# Patient Record
Sex: Female | Born: 1965
Health system: Southern US, Community
[De-identification: ages and names within clinical notes are randomized; demographics above are authoritative.]

## PROBLEM LIST (undated history)

## (undated) ENCOUNTER — Ambulatory Visit

## (undated) ENCOUNTER — Encounter: Attending: Gynecologic Oncology | Primary: Gynecologic Oncology

## (undated) ENCOUNTER — Encounter

## (undated) ENCOUNTER — Telehealth

## (undated) ENCOUNTER — Non-Acute Institutional Stay: Payer: PRIVATE HEALTH INSURANCE

## (undated) DIAGNOSIS — K219 Gastro-esophageal reflux disease without esophagitis: Secondary | ICD-10-CM

## (undated) DIAGNOSIS — I10 Essential (primary) hypertension: Secondary | ICD-10-CM

## (undated) DIAGNOSIS — N390 Urinary tract infection, site not specified: Secondary | ICD-10-CM

## (undated) DIAGNOSIS — N943 Premenstrual tension syndrome: Secondary | ICD-10-CM

## (undated) DIAGNOSIS — E039 Hypothyroidism, unspecified: Secondary | ICD-10-CM

## (undated) DIAGNOSIS — M5430 Sciatica, unspecified side: Secondary | ICD-10-CM

## (undated) DIAGNOSIS — E663 Overweight: Secondary | ICD-10-CM

## (undated) DIAGNOSIS — Z923 Personal history of irradiation: Secondary | ICD-10-CM

## (undated) DIAGNOSIS — D649 Anemia, unspecified: Secondary | ICD-10-CM

## (undated) DIAGNOSIS — R000 Tachycardia, unspecified: Secondary | ICD-10-CM

## (undated) DIAGNOSIS — G47419 Narcolepsy without cataplexy: Secondary | ICD-10-CM

## (undated) DIAGNOSIS — S39012A Strain of muscle, fascia and tendon of lower back, initial encounter: Secondary | ICD-10-CM

## (undated) DIAGNOSIS — L732 Hidradenitis suppurativa: Secondary | ICD-10-CM

## (undated) DIAGNOSIS — C569 Malignant neoplasm of unspecified ovary: Secondary | ICD-10-CM

## (undated) DIAGNOSIS — Z0000 Encounter for general adult medical examination without abnormal findings: Secondary | ICD-10-CM

## (undated) DIAGNOSIS — G4730 Sleep apnea, unspecified: Secondary | ICD-10-CM

## (undated) DIAGNOSIS — R5383 Other fatigue: Secondary | ICD-10-CM

## (undated) DIAGNOSIS — M62830 Muscle spasm of back: Secondary | ICD-10-CM

## (undated) HISTORY — DX: Strain of muscle, fascia and tendon of lower back, initial encounter: S39.012A

## (undated) HISTORY — DX: Urinary tract infection, site not specified: N39.0

## (undated) HISTORY — DX: Personal history of irradiation: Z92.3

## (undated) HISTORY — DX: Anemia, unspecified: D64.9

## (undated) HISTORY — DX: Sciatica, unspecified side: M54.30

## (undated) HISTORY — DX: Essential (primary) hypertension: I10

## (undated) HISTORY — DX: Hidradenitis suppurativa: L73.2

## (undated) HISTORY — DX: Tachycardia, unspecified: R00.0

## (undated) HISTORY — DX: Narcolepsy without cataplexy: G47.419

## (undated) HISTORY — DX: Premenstrual tension syndrome: N94.3

## (undated) HISTORY — DX: Encounter for general adult medical examination without abnormal findings: Z00.00

## (undated) HISTORY — DX: Overweight: E66.3

## (undated) HISTORY — DX: Other fatigue: R53.83

## (undated) HISTORY — DX: Sleep apnea, unspecified: G47.30

## (undated) HISTORY — DX: Muscle spasm of back: M62.830

## (undated) HISTORY — DX: Gastro-esophageal reflux disease without esophagitis: K21.9

## (undated) HISTORY — DX: Hypothyroidism, unspecified: E03.9

---
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
## 1998-10-11 ENCOUNTER — Emergency Department (HOSPITAL_COMMUNITY): Admission: EM | Admit: 1998-10-11 | Discharge: 1998-10-11 | Payer: Self-pay | Admitting: *Deleted

## 1998-12-19 ENCOUNTER — Other Ambulatory Visit: Admission: RE | Admit: 1998-12-19 | Discharge: 1998-12-19 | Payer: Self-pay | Admitting: Obstetrics and Gynecology

## 2005-10-09 ENCOUNTER — Ambulatory Visit: Payer: Self-pay | Admitting: Family Medicine

## 2005-10-16 ENCOUNTER — Ambulatory Visit: Payer: Self-pay | Admitting: Family Medicine

## 2005-10-23 ENCOUNTER — Ambulatory Visit: Payer: Self-pay | Admitting: Family Medicine

## 2006-06-11 ENCOUNTER — Ambulatory Visit: Payer: Self-pay | Admitting: Family Medicine

## 2006-07-22 ENCOUNTER — Ambulatory Visit: Payer: Self-pay | Admitting: Family Medicine

## 2006-10-08 ENCOUNTER — Ambulatory Visit: Payer: Self-pay | Admitting: Family Medicine

## 2006-10-08 DIAGNOSIS — G4730 Sleep apnea, unspecified: Secondary | ICD-10-CM | POA: Insufficient documentation

## 2006-10-08 DIAGNOSIS — R5381 Other malaise: Secondary | ICD-10-CM

## 2006-10-08 DIAGNOSIS — N943 Premenstrual tension syndrome: Secondary | ICD-10-CM | POA: Insufficient documentation

## 2006-10-08 DIAGNOSIS — R5383 Other fatigue: Secondary | ICD-10-CM

## 2006-10-08 DIAGNOSIS — E039 Hypothyroidism, unspecified: Secondary | ICD-10-CM

## 2006-10-08 DIAGNOSIS — E663 Overweight: Secondary | ICD-10-CM | POA: Insufficient documentation

## 2006-11-05 ENCOUNTER — Encounter: Payer: Self-pay | Admitting: Family Medicine

## 2006-11-05 ENCOUNTER — Ambulatory Visit (HOSPITAL_BASED_OUTPATIENT_CLINIC_OR_DEPARTMENT_OTHER): Admission: RE | Admit: 2006-11-05 | Discharge: 2006-11-05 | Payer: Self-pay | Admitting: Family Medicine

## 2006-11-18 ENCOUNTER — Ambulatory Visit: Payer: Self-pay | Admitting: Pulmonary Disease

## 2006-11-27 ENCOUNTER — Telehealth: Payer: Self-pay

## 2006-12-01 ENCOUNTER — Ambulatory Visit: Payer: Self-pay | Admitting: Family Medicine

## 2006-12-09 ENCOUNTER — Ambulatory Visit: Payer: Self-pay | Admitting: Family Medicine

## 2007-04-07 ENCOUNTER — Ambulatory Visit: Payer: Self-pay | Admitting: Family Medicine

## 2007-04-07 DIAGNOSIS — K219 Gastro-esophageal reflux disease without esophagitis: Secondary | ICD-10-CM | POA: Insufficient documentation

## 2007-04-07 DIAGNOSIS — N390 Urinary tract infection, site not specified: Secondary | ICD-10-CM | POA: Insufficient documentation

## 2007-04-07 LAB — CONVERTED CEMR LAB
Bilirubin Urine: NEGATIVE
Blood in Urine, dipstick: NEGATIVE
Glucose, Urine, Semiquant: NEGATIVE
Ketones, urine, test strip: NEGATIVE
Nitrite: NEGATIVE
Specific Gravity, Urine: 1.015
WBC Urine, dipstick: NEGATIVE

## 2007-04-28 ENCOUNTER — Telehealth: Payer: Self-pay | Admitting: Family Medicine

## 2007-06-17 ENCOUNTER — Telehealth (INDEPENDENT_AMBULATORY_CARE_PROVIDER_SITE_OTHER): Payer: Self-pay | Admitting: *Deleted

## 2007-06-24 ENCOUNTER — Telehealth: Payer: Self-pay | Admitting: Family Medicine

## 2007-10-21 ENCOUNTER — Ambulatory Visit: Payer: Self-pay | Admitting: Family Medicine

## 2007-10-21 DIAGNOSIS — S335XXA Sprain of ligaments of lumbar spine, initial encounter: Secondary | ICD-10-CM

## 2007-10-21 DIAGNOSIS — M5380 Other specified dorsopathies, site unspecified: Secondary | ICD-10-CM

## 2007-10-21 DIAGNOSIS — M5430 Sciatica, unspecified side: Secondary | ICD-10-CM

## 2007-10-21 DIAGNOSIS — S339XXA Sprain of unspecified parts of lumbar spine and pelvis, initial encounter: Secondary | ICD-10-CM | POA: Insufficient documentation

## 2008-03-31 ENCOUNTER — Emergency Department (HOSPITAL_COMMUNITY): Admission: EM | Admit: 2008-03-31 | Discharge: 2008-03-31 | Payer: Self-pay | Admitting: Emergency Medicine

## 2008-10-23 ENCOUNTER — Ambulatory Visit: Payer: Self-pay | Admitting: Family Medicine

## 2008-10-31 ENCOUNTER — Ambulatory Visit: Payer: Self-pay | Admitting: Family Medicine

## 2008-10-31 LAB — CONVERTED CEMR LAB
ALT: 18 units/L (ref 0–35)
AST: 16 units/L (ref 0–37)
BUN: 9 mg/dL (ref 6–23)
Bilirubin Urine: NEGATIVE
Bilirubin, Direct: 0 mg/dL (ref 0.0–0.3)
Calcium: 9.2 mg/dL (ref 8.4–10.5)
Cholesterol: 141 mg/dL (ref 0–200)
Creatinine, Ser: 0.8 mg/dL (ref 0.4–1.2)
Eosinophils Relative: 4.4 % (ref 0.0–5.0)
GFR calc non Af Amer: 83.3 mL/min (ref >60)
LDL Cholesterol: 79 mg/dL (ref 0–99)
Lymphocytes Relative: 28.7 % (ref 12.0–46.0)
Monocytes Relative: 6 % (ref 3.0–12.0)
Neutrophils Relative %: 59.4 % (ref 43.0–77.0)
Platelets: 312 10*3/uL (ref 150.0–400.0)
Potassium: 3.9 meq/L (ref 3.5–5.1)
Protein, U semiquant: NEGATIVE
Total Bilirubin: 0.7 mg/dL (ref 0.3–1.2)
Triglycerides: 71 mg/dL (ref 0.0–149.0)
Urobilinogen, UA: 0.2
VLDL: 14.2 mg/dL (ref 0.0–40.0)
WBC: 11.3 10*3/uL — ABNORMAL HIGH (ref 4.5–10.5)

## 2009-08-07 ENCOUNTER — Ambulatory Visit: Payer: Self-pay | Admitting: Family Medicine

## 2009-08-07 DIAGNOSIS — G47419 Narcolepsy without cataplexy: Secondary | ICD-10-CM | POA: Insufficient documentation

## 2009-08-07 DIAGNOSIS — D649 Anemia, unspecified: Secondary | ICD-10-CM | POA: Insufficient documentation

## 2009-10-11 ENCOUNTER — Ambulatory Visit: Payer: Self-pay | Admitting: Family Medicine

## 2009-10-11 DIAGNOSIS — L732 Hidradenitis suppurativa: Secondary | ICD-10-CM

## 2009-10-11 DIAGNOSIS — R000 Tachycardia, unspecified: Secondary | ICD-10-CM

## 2009-10-12 ENCOUNTER — Encounter: Payer: Self-pay | Admitting: Family Medicine

## 2009-11-08 ENCOUNTER — Ambulatory Visit: Payer: Self-pay | Admitting: Family Medicine

## 2009-11-08 DIAGNOSIS — I10 Essential (primary) hypertension: Secondary | ICD-10-CM | POA: Insufficient documentation

## 2010-02-13 ENCOUNTER — Ambulatory Visit (HOSPITAL_COMMUNITY)
Admission: RE | Admit: 2010-02-13 | Discharge: 2010-02-13 | Payer: Self-pay | Source: Home / Self Care | Attending: Cardiovascular Disease | Admitting: Cardiovascular Disease

## 2010-02-13 ENCOUNTER — Encounter: Payer: Self-pay | Admitting: Cardiovascular Disease

## 2010-02-13 ENCOUNTER — Ambulatory Visit: Admission: RE | Admit: 2010-02-13 | Discharge: 2010-02-13 | Payer: Self-pay | Source: Home / Self Care

## 2010-02-13 ENCOUNTER — Ambulatory Visit
Admission: RE | Admit: 2010-02-13 | Discharge: 2010-02-13 | Payer: Self-pay | Source: Home / Self Care | Attending: Cardiovascular Disease | Admitting: Cardiovascular Disease

## 2010-03-07 NOTE — Assessment & Plan Note (Signed)
Summary: rash underneath bottom lip/med check/refills/cjr   Vital Signs:  Patient profile:   45 year old female Height:      64 inches (162.56 cm) Weight:      197 pounds (89.55 kg) O2 Sat:      99 % on Room air Temp:     98.3 degrees F (36.83 degrees C) oral Pulse rate:   107 / minute BP sitting:   120 / 94  (left arm) Cuff size:   large  Vitals Entered By: Josph Macho RMA (October 11, 2009 10:52 AM)  O2 Flow:  Room air CC: Rash underneath bottom lip/ med check/ refill/ CF Is Patient Diabetic? No   History of Present Illness: Patient is a 45 year old female in today with multiple concerns. Her first complaint is a rash under her lower lip which has been present for roughly 3 months. It started as a postural or acne type lesion. The lesion has become itchy hard and red since that time. And more recently has developed a new pustular head. Not painful and no other similar lesions are noted. 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 Her back has now completely recovered and she would like a note today to release her back to full duty at work. She denies low back pain radicular symptoms bowel or bladder incontinence or difficulty. She has a long history of intermittent palpitations. Generally they don't worry although at times they are more severe. They are not accompanied by chest pain shortness of breath nausea or other physical complaints. She does acknowledge significant caffeine use and working at night especially she also uses phentermine and new vigil to help with her shift work. Her father died in 07-18-22 prior to that time she was 6 times a week now she is working 5 times a week and often has trouble sleeping. She finds the new vigil helps her energy during the day and would like to continue it. She would like a refill on phentermine as well to help with her obesity but has not been taking it recently.  No recent illness/f/c/malasie. She does acknowledge she did not sleep well last night  Current Medications (verified): 1)  Xyzal 5 Mg Tabs (Levocetirizine Dihydrochloride) .... Take 1 Tablet By Mouth Once A Day 2)  Maxzide-25 37.5-25 Mg  Tabs (Triamterene-Hctz) .... Two Times A Day X 2 Weeks Before Menses Then 1 Eacjh Day 3)  Prilosec 40 Mg  Cpdr (Omeprazole) .Marland Kitchen.. 1 By Mouth Once Daily 4)  Flexeril 10 Mg Tabs (Cyclobenzaprine Hcl) .Marland Kitchen.. 1 Morning ,midafternoon and Bedtime For Muscle Spasm 5)  Levothyroxine Sodium 75 Mcg Tabs (Levothyroxine Sodium) .Marland Kitchen.. 1 Qd 6)  Nuvigil 150 Mg Tabs (Armodafinil) .Marland Kitchen.. 1 Qd 7)  Phenteramine 37.5 .Marland Kitchen.. 1 Each  Day To Decrease Appetite 8)  Ibuprofen 800 Mg Tabs (Ibuprofen) .Marland Kitchen.. 1 By Mouth Three Times A Day For Inflammation and Pain.  Allergies (verified): 1)  Amoxicillin (Amoxicillin)  Past History:  Past medical history reviewed for relevance to current acute and chronic problems. Social history (including risk factors) reviewed for relevance to current acute and chronic problems.  Social History: Reviewed history from 04/07/2007 and no changes required. Married 2 children son 69 and daughter 27 Occupation:sleep study tech  Never Smoked Regular exercise-yes  Review of Systems      See HPI  Physical Exam  General:  Well-developed,well-nourished,in no acute distress; alert,appropriate  and cooperative throughout examination Head:  Normocephalic and atraumatic without obvious abnormalities. No apparent alopecia or balding. Nose:  External nasal examination shows no deformity or inflammation. Nasal mucosa are pink and moist without lesions or exudates. Mouth:  dry mucus membranes Neck:  No deformities, masses, or tenderness noted. Lungs:  Normal respiratory effort, chest expands symmetrically. Lungs are clear to auscultation, no crackles or wheezes. Heart:  Normal rate and regular rhythm. S1 and S2 normal without gallop, murmur, click, rub or other extra  sounds. Abdomen:  Bowel sounds positive,abdomen soft and non-tender without masses, organomegaly or hernias noted. Msk:  No deformity or scoliosis noted of thoracic or lumbar spine.   Extremities:  No clubbing, cyanosis, edema, or deformity noted with normal full range of motion of all joints.   Neurologic:  No cranial nerve deficits noted. Station and gait are normal. Plantar reflexes are down-going bilaterally. DTRs are symmetrical throughout. Sensory, motor and coordinative functions appear intact. Skin:  2-85mm erythematous hard, raided lesion on chin with pustular head Cervical Nodes:  No lymphadenopathy noted Psych:  Cognition and judgment appear intact. Alert and cooperative with normal attention span and concentration. No apparent delusions, illusions, hallucinations   Impression & Recommendations:  Problem # 1:  HIDRADENITIS SUPPURATIVA (ICD-705.83)  Orders: T-Culture, Wound ([id number]) with underlying scar tissue noted if a course of antibiotics and topical treatment does not resolve this may consider surgical removal.  Problem # 2:  NARCOLEPSY WITHOUT CATAPLEXY (ICD-347.00) Given refill on Nuvigil  Problem # 3:  TACHYCARDIA (ICD-785.0) Multifactorial, will not restart Phentermine, will start some Toprol and she is asked to avoid or at least minimize caffeine, reeval in one month or sooner if patient develops any concerning symptoms  Problem # 4:  LUMBOSACRAL STRAIN, ACUTE (ICD-846.0) Improved, released back to full duty work today. Report if symptoms worsen  Complete Medication List: 1)  Xyzal 5 Mg Tabs (Levocetirizine dihydrochloride) .... Take 1 tablet by mouth once a day 2)  Maxzide-25 37.5-25 Mg Tabs (Triamterene-hctz) .... Two times a day x 2 weeks before menses then 1 eacjh day 3)  Prilosec 40 Mg Cpdr (Omeprazole) .Marland Kitchen.. 1 by mouth once daily 4)  Flexeril 10 Mg Tabs (Cyclobenzaprine hcl) .Marland Kitchen.. 1 morning ,midafternoon and bedtime for muscle spasm 5)  Levothyroxine  Sodium 75 Mcg Tabs (Levothyroxine sodium) .Marland Kitchen.. 1 qd 6)  Nuvigil 150 Mg Tabs (Armodafinil) .Marland Kitchen.. 1 tab po daily 7)  Phenteramine 37.5  .Marland Kitchen.. 1 each  day to decrease appetite 8)  Ibuprofen 800 Mg Tabs (Ibuprofen) .Marland Kitchen.. 1 by mouth three times a day for inflammation and pain. 9)  Doxycycline Hyclate 100 Mg Caps (Doxycycline hyclate) .Marland Kitchen.. 1 cap by mouth two times a day x 10 day 10)  Toprol Xl 25 Mg Xr24h-tab (Metoprolol succinate) .Marland Kitchen.. 1 tab by mouth daily  Patient Instructions: 1)  Please schedule a follow-up appointment in 1 month.  2)  Limit your Sodium(salt) .  3)  It is important that you exercise reguarly at least 20 minutes 5 times a week. If you develop chest pain, have severe difficulty breathing, or feel very tired, stop exercising immediately and seek medical attention.  4)  You need to lose weight. Consider a lower calorie diet and regular exercise.  5)  Take your antibiotic as prescribed until ALL of it is gone, but stop if you develop a rash or swelling and contact our office as soon as possible.  6)  For skin Use Cetaphil soap, Witch Hazel Astringent, and a Benzoyl Peroxide  product, such as Oxy 10 7)  While taking a antibiotic take a probiotic such as Align or Activia Prescriptions: TOPROL XL 25 MG XR24H-TAB (METOPROLOL SUCCINATE) 1 tab by mouth daily  #30 x 2   Entered and Authorized by:   Danise Edge MD   Signed by:   Danise Edge MD on 10/11/2009   Method used:   Electronically to        [facility]  [phone]* (retail)       [address]       [place], [place]  [address]       Ph: [phone] or [phone]       Fax: [phone]   RxID:   [id number] DOXYCYCLINE HYCLATE 100 MG CAPS (DOXYCYCLINE HYCLATE) 1 cap by mouth two times a day x 10 day  #20 x 1   Entered and Authorized by:   Danise Edge MD   Signed by:   Danise Edge MD on 10/11/2009   Method used:   Electronically to        [facility]  [phone]* (retail)        [address]       [place], [place]  [address]       Ph: [phone] or [phone]       Fax: [phone]   RxID:   [id number] NUVIGIL 150 MG TABS (ARMODAFINIL) 1 tab po daily  #30 x 1   Entered and Authorized by:   Danise Edge MD   Signed by:   Danise Edge MD on 10/11/2009   Method used:   Print then Give to Patient   RxID:   [id number]

## 2010-03-07 NOTE — Assessment & Plan Note (Signed)
Summary: 1 month rov/njr   Vital Signs:  Patient profile:   45 year old female Height:      64 inches (162.56 cm) Weight:      201.31 pounds (91.50 kg) O2 Sat:      99 % on Room air Temp:     98.3 degrees F (36.83 degrees C) oral Pulse rate:   106 / minute BP sitting:   142 / 100  (left arm) Cuff size:   large  Vitals Entered By: Josph Macho RMA (November 08, 2009 8:47 AM)  O2 Flow:  Room air CC: 1 month office visit/ CF Is Patient Diabetic? No   History of Present Illness: patient is in today for followup on her blood pressure and recent illness. She is feeling well no fevers, no chills, no congestion no, and chest pain. She denies palpitations, shortness of breath, GI or GU complaints at this time. She noticed that she has not started the Toprol yet she was trying to use at the bedside. She did stop the phentermine I suggested at the last visit and has had no more palpitations since doing this  Current Medications (verified): 1)  Xyzal 5 Mg Tabs (Levocetirizine Dihydrochloride) .... Take 1 Tablet By Mouth Once A Day 2)  Maxzide-25 37.5-25 Mg  Tabs (Triamterene-Hctz) .... Two Times A Day X 2 Weeks Before Menses Then 1 Eacjh Day 3)  Prilosec 40 Mg  Cpdr (Omeprazole) .Marland Kitchen.. 1 By Mouth Once Daily 4)  Flexeril 10 Mg Tabs (Cyclobenzaprine Hcl) .Marland Kitchen.. 1 Morning ,midafternoon and Bedtime For Muscle Spasm 5)  Levothyroxine Sodium 75 Mcg Tabs (Levothyroxine Sodium) .Marland Kitchen.. 1 Qd 6)  Nuvigil 150 Mg Tabs (Armodafinil) .Marland Kitchen.. 1 Tab Po Daily 7)  Ibuprofen 800 Mg Tabs (Ibuprofen) .Marland Kitchen.. 1 By Mouth Three Times A Day For Inflammation and Pain. 8)  Toprol Xl 25 Mg Xr24h-Tab (Metoprolol Succinate) .Marland Kitchen.. 1 Tab By Mouth Daily  Allergies (verified): 1)  Amoxicillin (Amoxicillin)  Past History:  Past medical history reviewed for relevance to current acute and chronic problems. Social history (including risk factors) reviewed for relevance to current acute and chronic problems.  Social History: Reviewed  history from 04/07/2007 and no changes required. Married 2 children son 29 and daughter 23 Occupation:sleep study tech  Never Smoked Regular exercise-yes  Review of Systems      See HPI  Physical Exam  General:  Well-developed,well-nourished,in no acute distress; alert,appropriate and cooperative throughout examination Head:  Normocephalic and atraumatic without obvious abnormalities. No apparent alopecia or balding. Eyes:  No corneal or conjunctival inflammation noted. EOMI. Perrla. Funduscopic exam benign, without hemorrhages, exudates or papilledema. Vision grossly normal. Nose:  External nasal examination shows no deformity or inflammation. Nasal mucosa are pink and moist without lesions or exudates. Mouth:  Oral mucosa and oropharynx without lesions or exudates.  Teeth in good repair. Neck:  No deformities, masses, or tenderness noted. Lungs:  Normal respiratory effort, chest expands symmetrically. Lungs are clear to auscultation, no crackles or wheezes. Heart:  Normal rate and regular rhythm. S1 and S2 normal without gallop, murmur, click, rub or other extra sounds. Abdomen:  Bowel sounds positive,abdomen soft and non-tender without masses, organomegaly or hernias noted. Extremities:  No clubbing, cyanosis, edema, or deformity noted with normal full range of motion of all joints.   Psych:  Cognition and judgment appear intact. Alert and cooperative with normal attention span and concentration. No apparent delusions, illusions, hallucinations   Impression & Recommendations:  Problem # 1:  TACHYCARDIA (ICD-785.0)  start Toprol  Problem # 2:  GERD (ICD-530.81)  Her updated medication list for this problem includes:    Prilosec 40 Mg Cpdr (Omeprazole) .Marland Kitchen... 1 by mouth once daily No c/o  Problem # 3:  ESSENTIAL HYPERTENSION, BENIGN (ICD-401.1)  Her updated medication list for this problem includes:    Maxzide-25 37.5-25 Mg Tabs (Triamterene-hctz) .Marland Kitchen..Marland Kitchen Two times a day x 2 weeks  before menses then 1 eacjh day    Toprol Xl 25 Mg Xr24h-tab (Metoprolol succinate) .Marland Kitchen... 1 tab by mouth daily start Toprol  Complete Medication List: 1)  Xyzal 5 Mg Tabs (Levocetirizine dihydrochloride) .... Take 1 tablet by mouth once a day 2)  Maxzide-25 37.5-25 Mg Tabs (Triamterene-hctz) .... Two times a day x 2 weeks before menses then 1 eacjh day 3)  Prilosec 40 Mg Cpdr (Omeprazole) .Marland Kitchen.. 1 by mouth once daily 4)  Flexeril 10 Mg Tabs (Cyclobenzaprine hcl) .Marland Kitchen.. 1 morning ,midafternoon and bedtime for muscle spasm 5)  Levothyroxine Sodium 75 Mcg Tabs (Levothyroxine sodium) .Marland Kitchen.. 1 qd 6)  Nuvigil 150 Mg Tabs (Armodafinil) .Marland Kitchen.. 1 tab po daily 7)  Ibuprofen 800 Mg Tabs (Ibuprofen) .Marland Kitchen.. 1 by mouth three times a day for inflammation and pain. 8)  Toprol Xl 25 Mg Xr24h-tab (Metoprolol succinate) .Marland Kitchen.. 1 tab by mouth daily 9)  Azithromycin 250 Mg Tabs (Azithromycin) .... 2 tabs by mouth once and then 1 tab by mouth once daily x 4d 10)  Mucinex 600 Mg Xr12h-tab (Guaifenesin) .Marland Kitchen.. 1 tab by mouth by mouth two times a day x 10 day  Patient Instructions: 1)  Please schedule a follow-up appointment in 1 month.  2)  If you start the antibiotic thenTake your antibiotic as prescribed until ALL of it is gone, but stop if you develop a rash or swelling and contact our office as soon as possible.  3)  Acute sinusitis symptoms for less than 10 days are not helped by antibiotics. Use warm moist compresses, and over the counter decongestants( only as directed). Call if no improvement in 5-7 days, sooner if increasing pain, fever, or new symptoms.  Prescriptions: AZITHROMYCIN 250 MG TABS (AZITHROMYCIN) 2 tabs by mouth once and then 1 tab by mouth once daily x 4d  #6 x 0   Entered and Authorized by:   Danise Edge MD   Signed by:   Danise Edge MD on 11/08/2009   Method used:   Electronically to        [facility]  [phone]* (retail)       [address]       [place], [place]  [address]       Ph: [phone] or [phone]       Fax: [phone]   RxID:   [id number]

## 2010-03-07 NOTE — Assessment & Plan Note (Signed)
Summary: MED CHECK/REFILLES/CONSULT THYROID/CJR   Vital Signs:  Patient profile:   45 year old female Height:      64 inches Weight:      190 pounds BMI:     32.73 O2 Sat:      95 % Temp:     98.3 degrees F Pulse rate:   99 / minute Pulse rhythm:   regular BP sitting:   124 / 82  (left arm) Cuff size:   large  Vitals Entered By: Pura Spice, RN (August 07, 2009 1:28 PM) CC: rov reck tsh  ? pherteramine    History of Present Illness: This 45 year old white married female is in concerned about her thyroid status since she is having considerable problem weight gain she has lost down his gaining easily and would like to start on the weight reduction diet as well as requests and phentermine to help decrease her appetite She consented to have a problem with narcolepsy do not a and she is responded to do visual in the past and would like a new prescription GERD has been controlled with Prilosec  Allergies: 1)  Amoxicillin (Amoxicillin)  Past History:  Social History: Last updated: 04/07/2007 Married 2 children son 51 and daughter 27 Occupation:sleep study tech  Never Smoked Regular exercise-yes  Risk Factors: Smoking Status: never (04/07/2007)  Review of Systems      See HPI  The patient denies anorexia, fever, weight loss, weight gain, vision loss, decreased hearing, hoarseness, chest pain, syncope, dyspnea on exertion, peripheral edema, prolonged cough, headaches, hemoptysis, abdominal pain, melena, hematochezia, severe indigestion/heartburn, hematuria, incontinence, genital sores, muscle weakness, suspicious skin lesions, transient blindness, difficulty walking, depression, unusual weight change, abnormal bleeding, enlarged lymph nodes, angioedema, breast masses, and testicular masses.    Physical Exam  General:  Well-developed,well-nourished,in no acute distress; alert,appropriate and cooperative throughout examinationoverweight-appearing.   Lungs:  Normal respiratory  effort, chest expands symmetrically. Lungs are clear to auscultation, no crackles or wheezes. Heart:  Normal rate and regular rhythm. S1 and S2 normal without gallop, murmur, click, rub or other extra sounds. Abdomen:  Bowel sounds positive,abdomen soft and non-tender without masses, organomegaly or hernias noted., obese   Impression & Recommendations:  Problem # 1:  ANEMIA (ICD-285.9) Assessment Improved  Orders: Hgb ([address]) Fingerstick ([address])  Problem # 2:  SLEEP APNEA (ICD-780.57) Assessment: Unchanged  Problem # 3:  GERD (ICD-530.81) Assessment: Improved  Her updated medication list for this problem includes:    Prilosec 40 Mg Cpdr (Omeprazole) .Marland Kitchen... 1 by mouth once daily  Problem # 4:  OVERWEIGHT (ICD-278.02) Assessment: Unchanged weight reduction diet phernteramine 37.5 mg each a.m. to decrease appetite for 3 months  Problem # 5:  HYPOTHYROIDISM NOS (ICD-244.9) Assessment: Unchanged  Her updated medication list for this problem includes:    Levothyroxine Sodium 75 Mcg Tabs (Levothyroxine sodium) .Marland Kitchen... 1 qd  Orders: Venipuncture ([address]) TLB-TSH (Thyroid Stimulating Hormone) (84443-TSH)  Complete Medication List: 1)  Xyzal 5 Mg Tabs (Levocetirizine dihydrochloride) .... Take 1 tablet by mouth once a day 2)  Maxzide-25 37.5-25 Mg Tabs (Triamterene-hctz) .... Two times a day x 2 weeks before menses then 1 eacjh day 3)  Prilosec 40 Mg Cpdr (Omeprazole) .Marland Kitchen.. 1 by mouth once daily 4)  Flexeril 10 Mg Tabs (Cyclobenzaprine hcl) .Marland Kitchen.. 1 morning ,midafternoon and bedtime for muscle spasm 5)  Levothyroxine Sodium 75 Mcg Tabs (Levothyroxine sodium) .Marland Kitchen.. 1 qd 6)  Nuvigil 150 Mg Tabs (Armodafinil) .Marland Kitchen.. 1 qd 7)  Phenteramine 37.5  .Marland KitchenMarland KitchenMarland Kitchen  1 each  day to decrease appetite 8)  Ibuprofen 800 Mg Tabs (Ibuprofen) .Marland Kitchen.. 1 by mouth three times a day for inflammation and pain.  Patient Instructions: 1)  head lab for TSH and hemoglobin, hemoglobin was normal 2)  You need to lose weight.  Consider a lower calorie diet and regular exercise.  3)  We'll call lab results Prescriptions: PHENTERAMINE 37.5 1 each  day to decrease appetite  #30 x 3   Entered and Authorized by:   Judithann Sheen MD   Signed by:   Judithann Sheen MD on 08/07/2009   Method used:   Print then Give to Patient   RxID:   [id number] NUVIGIL 150 MG TABS (ARMODAFINIL) 1 qd  #30 x 11   Entered and Authorized by:   Judithann Sheen MD   Signed by:   Judithann Sheen MD on 08/07/2009   Method used:   Print then Give to Patient   RxID:   [id number]   Laboratory Results   Blood Tests   Date/Time Recieved: August 07, 2009 3:38 PM  Date/Time Reported: August 07, 2009 3:38 PM    CBC HGB:  11.6 g/dL   (Normal Range: 29.5-28.4 in Males, 12.0-15.0 in Females) Comments: Wynona Canes, CMA  August 07, 2009 3:38 PM

## 2010-03-07 NOTE — Letter (Signed)
Summary: Generic Letter  [facility]  [address] [place], [place] [address]   Phone: [phone]  Fax: [phone]    10/11/2009  [name] [address] [place], [place]  [address]  Dear Ms. Yehuda Mao,    Patient is released to return to work unrestricted duty as of today 10/11/2009     Sincerely,   Danise Edge MD

## 2010-03-07 NOTE — Assessment & Plan Note (Signed)
Summary: F/U ECHO DONE TODAY AT 8:30   History of Present Illness: Jamesetta is seen today at the request of Dr Rogelia Rohrer for tachycardia and palpiations. She has had them for months.  Primary wanted to start BB.  Thyroid normal.  Occasional palpitations with exertion but no syncpope SSCP, and dyspnea consistant with deconditioning.  She works third shift at times, is sedentary and has been on both phenteramine and provigil in the past.  Long discussion with her regarding lifestyle, weight, exercise and stimulants.  Told her to avoid provigil and phenteramine.    ECG today Normal HR 89 Echo:  Reviewed normal EF 55-60k% and no evidence of pulmonary hypertension.  Current Problems (verified): 1)  Tachycardia  (ICD-785.0) 2)  Essential Hypertension, Benign  (ICD-401.1) 3)  Hidradenitis Suppurativa  (ICD-705.83) 4)  Narcolepsy Without Cataplexy  (ICD-347.00) 5)  Anemia  (ICD-285.9) 6)  Physical Examination  (ICD-V70.0) 7)  Muscle Spasm, Back  (ICD-724.8) 8)  Sciatica, Acute  (ICD-724.3) 9)  Lumbosacral Strain, Acute  (ICD-846.0) 10)  Gerd  (ICD-530.81) 11)  Uti  (ICD-599.0) 12)  Pms  (ICD-625.4) 13)  Sleep Apnea  (ICD-780.57) 14)  Fatigue  (ICD-780.79) 15)  Overweight  (ICD-278.02) 16)  Hypothyroidism Nos  (ICD-244.9)  Current Medications (verified): 1)  Prilosec 40 Mg  Cpdr (Omeprazole) .... As Needed 2)  Flexeril 10 Mg Tabs (Cyclobenzaprine Hcl) .... As Needed 3)  Levothyroxine Sodium 75 Mcg Tabs (Levothyroxine Sodium) .Marland Kitchen.. 1 Qd 4)  Nuvigil 150 Mg Tabs (Armodafinil) .... As Needed 5)  Ibuprofen 800 Mg Tabs (Ibuprofen) .Marland Kitchen.. 1 By Mouth Three Times A Day For Inflammation and Pain.  Allergies (verified): 1)  Amoxicillin (Amoxicillin)  Past History:  Past Medical History: Last updated: 02/11/2010 TACHYCARDIA  ESSENTIAL HYPERTENSION, BENIGN HIDRADENITIS SUPPURATIVA NARCOLEPSY WITHOUT CATAPLEXY ANEMIA  PHYSICAL EXAMINATION  MUSCLE SPASM, BACK SCIATICA, ACUTE  LUMBOSACRAL STRAIN,  ACUTE  GERD  UTI  PMS  SLEEP APNEA  FATIGUE  OVERWEIGHT  HYPOTHYROIDISM NOS   Family History: Last updated: 02/11/2010 noncontributory  Social History: Last updated: 04/07/2007 Married 2 children son 74 and daughter 51 Occupation:sleep study tech  Never Smoked Regular exercise-yes  Review of Systems       Denies fever, malais, weight loss, blurry vision, decreased visual acuity, cough, sputum, SOB, hemoptysis, pleuritic pain,heartburn, abdominal pain, melena, lower extremity edema, claudication, or rash.   Vital Signs:  Patient profile:   45 year old female Height:      64 inches Weight:      201 pounds BMI:     34.63 Pulse rate:   89 / minute Resp:     14 per minute BP sitting:   122 / 80  (left arm)  Vitals Entered By: Kem Parkinson (February 13, 2010 9:48 AM)  Physical Exam  General:  Affect appropriate Healthy:  appears stated age HEENT: normal Neck supple with no adenopathy JVP normal no bruits no thyromegaly Lungs clear with no wheezing and good diaphragmatic motion Heart:  S1/S2 no murmur,rub, gallop or click PMI normal Abdomen: benighn, BS positve, no tenderness, no AAA no bruit.  No HSM or HJR Distal pulses intact with no bruits No edema Neuro non-focal Skin warm and dry    Impression & Recommendations:  Problem # 1:  TACHYCARDIA (ICD-785.0) No abnormal heart rate response.  Normal echo ECG and TSH.  Avoid stimulants phenteramne and provigil.  Weight loss and exercise encouraged  Problem # 2:  ESSENTIAL HYPERTENSION, BENIGN (ICD-401.1) Continue diuretic as needed.  I dont think  she is taking Toprol.  Will not need meds if she makes lifestyle changes The following medications were removed from the medication list:    Maxzide-25 37.5-25 Mg Tabs (Triamterene-hctz) .Marland Kitchen..Marland Kitchen Two times a day x 2 weeks before menses then 1 eacjh day    Toprol Xl 25 Mg Xr24h-tab (Metoprolol succinate) .Marland Kitchen... 1 tab by mouth daily  Other Orders: EKG w/ Interpretation  (93[address])  Patient Instructions: 1)  Your physician recommends that you schedule a follow-up appointment in: AS NEEDED 2)  Your physician recommends that you continue on your current medications as directed. Please refer to the Current Medication list given to you today.

## 2010-04-15 ENCOUNTER — Encounter: Payer: Self-pay | Admitting: Family Medicine

## 2010-04-15 ENCOUNTER — Other Ambulatory Visit (INDEPENDENT_AMBULATORY_CARE_PROVIDER_SITE_OTHER): Payer: BC Managed Care – PPO | Admitting: Family Medicine

## 2010-04-15 DIAGNOSIS — Z0000 Encounter for general adult medical examination without abnormal findings: Secondary | ICD-10-CM

## 2010-04-15 LAB — CBC WITH DIFFERENTIAL/PLATELET
Eosinophils Relative: 2.4 % (ref 0.0–5.0)
HCT: 34.8 % — ABNORMAL LOW (ref 36.0–46.0)
Hemoglobin: 11.9 g/dL — ABNORMAL LOW (ref 12.0–15.0)
Lymphs Abs: 2.5 10*3/uL (ref 0.7–4.0)
MCV: 88.9 fl (ref 78.0–100.0)
Monocytes Absolute: 0.6 10*3/uL (ref 0.1–1.0)
Neutro Abs: 5.3 10*3/uL (ref 1.4–7.7)
Platelets: 290 10*3/uL (ref 150.0–400.0)
RDW: 14.2 % (ref 11.5–14.6)
WBC: 8.6 10*3/uL (ref 4.5–10.5)

## 2010-04-15 LAB — HEPATIC FUNCTION PANEL
ALT: 26 U/L (ref 0–35)
AST: 22 U/L (ref 0–37)
Albumin: 4.1 g/dL (ref 3.5–5.2)
Total Bilirubin: 0.5 mg/dL (ref 0.3–1.2)

## 2010-04-15 LAB — POCT URINALYSIS DIPSTICK
Bilirubin, UA: NEGATIVE
Blood, UA: NEGATIVE
Glucose, UA: NEGATIVE
Spec Grav, UA: 1.02

## 2010-04-15 LAB — BASIC METABOLIC PANEL
BUN: 9 mg/dL (ref 6–23)
Chloride: 103 mEq/L (ref 96–112)
Glucose, Bld: 88 mg/dL (ref 70–99)
Potassium: 4.1 mEq/L (ref 3.5–5.1)
Sodium: 136 mEq/L (ref 135–145)

## 2010-04-15 LAB — TSH: TSH: 1.48 u[IU]/mL (ref 0.35–5.50)

## 2010-04-15 LAB — LIPID PANEL
Cholesterol: 139 mg/dL (ref 0–200)
LDL Cholesterol: 73 mg/dL (ref 0–99)
Triglycerides: 74 mg/dL (ref 0.0–149.0)

## 2010-04-25 ENCOUNTER — Encounter: Payer: Self-pay | Admitting: Family Medicine

## 2010-04-25 ENCOUNTER — Ambulatory Visit (INDEPENDENT_AMBULATORY_CARE_PROVIDER_SITE_OTHER): Payer: BC Managed Care – PPO | Admitting: Family Medicine

## 2010-04-25 VITALS — BP 120/80 | HR 113 | Temp 98.4°F | Ht 64.5 in | Wt 198.0 lb

## 2010-04-25 DIAGNOSIS — M549 Dorsalgia, unspecified: Secondary | ICD-10-CM

## 2010-04-25 DIAGNOSIS — E669 Obesity, unspecified: Secondary | ICD-10-CM

## 2010-04-25 DIAGNOSIS — R5383 Other fatigue: Secondary | ICD-10-CM

## 2010-04-25 DIAGNOSIS — K219 Gastro-esophageal reflux disease without esophagitis: Secondary | ICD-10-CM

## 2010-04-25 DIAGNOSIS — G8929 Other chronic pain: Secondary | ICD-10-CM

## 2010-04-25 DIAGNOSIS — Z0000 Encounter for general adult medical examination without abnormal findings: Secondary | ICD-10-CM

## 2010-04-25 MED ORDER — OMEPRAZOLE 40 MG PO CPDR: 40.0000 mg | DELAYED_RELEASE_CAPSULE | Freq: Every day | ORAL | Status: DC

## 2010-04-25 MED ORDER — PHENTERMINE HCL 37.5 MG PO TABS: 37.5000 mg | ORAL_TABLET | Freq: Every day | ORAL | Status: DC

## 2010-04-25 MED ORDER — ALPRAZOLAM 0.25 MG PO TABS: 0.2500 mg | ORAL_TABLET | Freq: Three times a day (TID) | ORAL | Status: DC | PRN

## 2010-04-25 MED ORDER — LEVOTHYROXINE SODIUM 75 MCG PO TABS: 75.0000 ug | ORAL_TABLET | Freq: Every day | ORAL | Status: DC

## 2010-04-25 MED ORDER — ARMODAFINIL 150 MG PO TABS: 150.0000 mg | ORAL_TABLET | ORAL | Status: DC | PRN

## 2010-04-25 MED ORDER — IBUPROFEN 800 MG PO TABS: 800.0000 mg | ORAL_TABLET | Freq: Three times a day (TID) | ORAL | Status: DC | PRN

## 2010-04-25 NOTE — Patient Instructions (Signed)
I think you  Are doing fine, pleased you are losing weight , continue to diet Lab studies are godd Continue regular medications, have refilled necessry medications

## 2010-04-29 ENCOUNTER — Encounter: Payer: Self-pay | Admitting: Family Medicine

## 2010-04-29 NOTE — Progress Notes (Signed)
  Subjective:    Patient ID: Kayla Kelley, female    DOB: [birth year], 45 y.o.   MRN: [id number] This 45 year old white married female is in full physical examination having had a GYN exam by Dr. Nelle Don. Patient relates even though she is overweight she has lost 10 pounds and had worked for her in order to lose. She has been somewhat depressed having lost her father this past year She continues to have episodes of anxiety and needs to get a prescription for alprazolam patient has sleep apnea and uses CPAP however she still complained of being fatigued during the day. Continues to have problem with backache and desires to continue taking Advil 800 mg t.i.d. GERD is controlled with omeprazole 40 mg q.d., she relates she continues to have premenstrual symptoms  HPI    Review of Systems  Constitutional: Positive for fatigue.  HENT: Negative.   Eyes: Negative.   Cardiovascular:       Occasional has PAT  Gastrointestinal:       GERD controlled with Prilosec  Musculoskeletal: Positive for myalgias and back pain.  Skin: Negative.   Neurological: Negative.   Hematological: Negative.   Psychiatric/Behavioral: Positive for agitation. The patient is nervous/anxious.        Objective:   Physical Exam the patient is well-developed well-nourished obese white female who appears to be in no distress cooperative and pleasant HEENT negative examination including carotids are normal thyroid is nonpalpable Lungs clear to palpation percussion and auscultation no wheezing or rales Breasts full no masses palpable axilla clear no tenderness Heart no cardiomegaly heart sounds good without murmurs peripheral pulses good and equal bilaterally Abdomen liver spleen and kidneys nonpalpable obese abdomen no masses bowel sounds normal Pelvic and rectal not done examined by GYN Disturbances and spine -2 examination Neurological examination negative Skin examination reveals no nevi no lesions         Assessment & Plan:  Preventive health examination reveals healthy female obese with plan to continue weight reduction diet to try phentermine 37.5 mg q.a.m. For 3 months Labile hypertension and is normal 120/80 Anxiety reaction to treat with alprazolam GERD under control continue Prozac

## 2010-05-07 ENCOUNTER — Encounter: Payer: Self-pay | Admitting: Internal Medicine

## 2010-05-07 ENCOUNTER — Ambulatory Visit (INDEPENDENT_AMBULATORY_CARE_PROVIDER_SITE_OTHER): Payer: BC Managed Care – PPO | Admitting: Internal Medicine

## 2010-05-07 VITALS — BP 110/80 | Temp 98.3°F | Wt 196.0 lb

## 2010-05-07 DIAGNOSIS — J069 Acute upper respiratory infection, unspecified: Secondary | ICD-10-CM

## 2010-05-07 NOTE — Progress Notes (Signed)
  Subjective:    Patient ID: Kayla Kelley, female    DOB: 03/27/1965, 45 y.o.   MRN: [id number]  HPI   45 year old patient who is seen today with a chief complaint of sinus congestion and some headache. But last week she had a sore throat that had resolved. 2 days ago she had a one-day history of diarrhea. At the present time her chief complaint is a mild headache associated with sinus congestion there has been no sore throat fever or productive cough. She has been using DayQuil and NyQuil without benefit    Review of Systems  Constitutional: Positive for fatigue.  HENT: Positive for congestion. Negative for hearing loss, sore throat, rhinorrhea, dental problem, sinus pressure and tinnitus.   Eyes: Negative for pain, discharge and visual disturbance.  Respiratory: Positive for cough. Negative for shortness of breath.   Cardiovascular: Negative for chest pain, palpitations and leg swelling.  Gastrointestinal: Negative for nausea, vomiting, abdominal pain, diarrhea, constipation, blood in stool and abdominal distention.  Genitourinary: Negative for dysuria, urgency, frequency, hematuria, flank pain, vaginal bleeding, vaginal discharge, difficulty urinating, vaginal pain and pelvic pain.  Musculoskeletal: Negative for joint swelling, arthralgias and gait problem.  Skin: Negative for rash.  Neurological: Positive for weakness and headaches. Negative for dizziness, syncope, speech difficulty and numbness.  Hematological: Negative for adenopathy.  Psychiatric/Behavioral: Negative for behavioral problems, dysphoric mood and agitation. The patient is not nervous/anxious.        Objective:   Physical Exam  Constitutional: She is oriented to person, place, and time. She appears well-developed and well-nourished.  HENT:  Head: Normocephalic.  Right Ear: External ear normal.  Left Ear: External ear normal.  Mouth/Throat: Oropharynx is clear and moist.  Eyes: Conjunctivae and EOM are normal.  Pupils are equal, round, and reactive to light.  Neck: Normal range of motion. Neck supple. No thyromegaly present.  Cardiovascular: Normal rate, regular rhythm, normal heart sounds and intact distal pulses.   Pulmonary/Chest: Effort normal and breath sounds normal.  Abdominal: Soft. Bowel sounds are normal. She exhibits no mass. There is no tenderness.  Musculoskeletal: Normal range of motion.  Lymphadenopathy:    She has no cervical adenopathy.  Neurological: She is alert and oriented to person, place, and time.  Skin: Skin is warm and dry. No rash noted.  Psychiatric: She has a normal mood and affect. Her behavior is normal.          Assessment & Plan:  Viral URI. Will continue symptomatic treatment. She will call if she develops any signs of clinical worsening such as high fever chills or purulent productive cough. She will call she develops any chest pain or shortness of breath.

## 2010-05-07 NOTE — Patient Instructions (Signed)
Get plenty of rest, Drink lots of  clear liquids, and use Tylenol or ibuprofen for fever and discomfort.    Consider Claritin D twice daily Nasonex  Use once daily Call or return to clinic prn if these symptoms worsen or fail to improve as anticipated.

## 2010-05-21 LAB — DIFFERENTIAL
Basophils Absolute: 0.1 10*3/uL (ref 0.0–0.1)
Basophils Relative: 1 % (ref 0–1)
Eosinophils Relative: 4 % (ref 0–5)
Lymphocytes Relative: 29 % (ref 12–46)
Monocytes Absolute: 0.7 10*3/uL (ref 0.1–1.0)
Neutro Abs: 5.2 10*3/uL (ref 1.7–7.7)

## 2010-05-21 LAB — COMPREHENSIVE METABOLIC PANEL
Albumin: 3.6 g/dL (ref 3.5–5.2)
Alkaline Phosphatase: 73 U/L (ref 39–117)
BUN: 10 mg/dL (ref 6–23)
CO2: 26 mEq/L (ref 19–32)
Chloride: 106 mEq/L (ref 96–112)
Creatinine, Ser: 0.8 mg/dL (ref 0.4–1.2)
GFR calc non Af Amer: >60 mL/min (ref >60)
Potassium: 4 mEq/L (ref 3.5–5.1)
Total Bilirubin: 0.5 mg/dL (ref 0.3–1.2)

## 2010-05-21 LAB — CBC
HCT: 38.2 % (ref 36.0–46.0)
Hemoglobin: 12.6 g/dL (ref 12.0–15.0)
MCV: 87 fL (ref 78.0–100.0)
Platelets: 281 10*3/uL (ref 150–400)
RBC: 4.39 MIL/uL (ref 3.87–5.11)
WBC: 8.9 10*3/uL (ref 4.0–10.5)

## 2010-05-21 LAB — POCT CARDIAC MARKERS: CKMB, poc: <1 ng/mL — ABNORMAL LOW (ref 1.0–8.0)

## 2010-06-18 NOTE — Procedures (Signed)
NAME:  HOLLIS, [place] NO.:  [id number]   MEDICAL RECORD NO.:  [id number]          PATIENT TYPE:  OUT   LOCATION:  SLEEP CENTER                 FACILITY:  [facility]   PHYSICIAN:  Barbaraann Share, MD,FCCPDATE OF BIRTH:  January 19, 1966   DATE OF STUDY:  11/05/2006                            NOCTURNAL POLYSOMNOGRAM   REFERRING PHYSICIAN:  Tawny Asal, MD   INDICATION FOR STUDY:  Hypersomnia with sleep apnea.   EPWORTH SLEEPINESS SCORE:  16.   MEDICATIONS:   SLEEP ARCHITECTURE:  The patient had total sleep time of 401 minutes  with decreased slow wave sleep as well as REM.  Sleep onset latency was  normal as was REM onset.  Sleep efficiency was normal.   RESPIRATORY DATA:  The patient was found to have 48 obstructive hyponeas  and 5 obstructive apneas for an apnea/hypopnea index of 8 events per  hour.  The events were not positional and there was moderate snoring  noted throughout.   OXYGEN DATA:  There was O2 desaturation as low at 91% with the patient's  obstructive events.   CARDIAC DATA:  No clinically significant cardiac arrhythmias were noted.   MOVEMENT-PARASOMNIA:  The patient was found to have no significant leg  jerks or abnormal behaviors noted.   IMPRESSIONS-RECOMMENDATIONS:  1. Mild obstructive sleep apnea/hypopnea syndrome with an      apnea/hypopnea index of 8 events per hour and O2 desaturation is      low at 91%.  2. It should be noted the patient had decreased slow wave sleep and      REM, and therefore her degree of sleep apnea may be underestimated.      Clinical correlation is suggested.  3. Treatment for this degree of sleep apnea can include weight loss      alone if applicable, upper airway surgery,      oral appliance and also CPAP.  The decision to treat this mild      degree of sleep apnea should be based on how it is impacting the      patient's quality of life and also based on the patient's      lifestyle.      Barbaraann Share, MD,FCCP  Diplomate, American Board of Sleep  Medicine  Electronically Signed     [facility]/MED[facility]  D:  11/18/2006 14:52:17  T:  11/19/2006 12:14:33  Job:  [id number]

## 2010-11-17 ENCOUNTER — Other Ambulatory Visit: Payer: Self-pay | Admitting: Family Medicine

## 2011-01-22 ENCOUNTER — Telehealth: Payer: Self-pay | Admitting: Family Medicine

## 2011-01-22 NOTE — Telephone Encounter (Signed)
Pt Mother [name] and son are both pts of yours. Kayla Kelley was a [place] pt and had not yet found a PCP and would like to establish with you. Please advise

## 2011-01-22 NOTE — Telephone Encounter (Signed)
OK 

## 2011-03-05 ENCOUNTER — Encounter: Payer: Self-pay | Admitting: Family Medicine

## 2011-03-05 ENCOUNTER — Ambulatory Visit (INDEPENDENT_AMBULATORY_CARE_PROVIDER_SITE_OTHER): Payer: BC Managed Care – PPO | Admitting: Family Medicine

## 2011-03-05 DIAGNOSIS — E039 Hypothyroidism, unspecified: Secondary | ICD-10-CM

## 2011-03-05 DIAGNOSIS — E663 Overweight: Secondary | ICD-10-CM

## 2011-03-05 DIAGNOSIS — R030 Elevated blood-pressure reading, without diagnosis of hypertension: Secondary | ICD-10-CM

## 2011-03-05 MED ORDER — PHENTERMINE HCL 37.5 MG PO TABS: 37.5000 mg | ORAL_TABLET | Freq: Every day | ORAL | Status: DC

## 2011-03-05 NOTE — Progress Notes (Signed)
  Subjective:    Patient ID: Kayla Kelley, female    DOB: 04-04-1965, 46 y.o.   MRN: [id number]  HPI  Patient here to establish care with me. Seen in this practice previously for several years by Dr. Scotty Court. Past medical history reviewed. She has history of obesity, hypothyroidism, GERD, mild sleep apnea. She has taken phentermine off and on for several years without adverse side effect. She has possible white coat syndrome but blood pressures consistently well-controlled at work. 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  Patient had complete physical last March and will schedule repeat in March. Has never had baseline mammogram. Consistent use of regular medication including levothyroxin. No adverse side effects. She does not take omeprazole consistently for GERD symptoms. She has previously taken Nuvigil for fatigue issues related to third shift work. She had headaches and has not taken this in many months.  Past Medical History  Diagnosis Date  . Tachycardia   . Benign essential hypertension   . Hidradenitis suppurativa   . Narcolepsy without cataplexy   . Anemia   . Routine physical examination   . Muscle spasm of back   . Sciatica     acute  . Lumbosacral strain     acute  . GERD (gastroesophageal reflux disease)   . UTI (urinary tract infection)   . PMS (premenstrual syndrome)   . Sleep apnea   . Fatigue   . Overweight   . Hypothyroidism    No past surgical history on file.  reports that she has never smoked. She has never used smokeless tobacco. She reports that she does not drink alcohol or use illicit drugs. family history is not on file. Allergies  Allergen Reactions  . Amoxicillin     hives     Review of Systems  Constitutional: Negative for appetite change and unexpected weight change.  Respiratory: Negative for cough and shortness of breath.   Cardiovascular: Negative for chest pain, palpitations and leg swelling.  Gastrointestinal: Negative for abdominal pain.   Neurological: Negative for dizziness and headaches.  Hematological: Negative for adenopathy.       Objective:   Physical Exam  Constitutional: She is oriented to person, place, and time. She appears well-developed and well-nourished. No distress.  HENT:  Mouth/Throat: Oropharynx is clear and moist.  Neck: Neck supple. No thyromegaly present.  Cardiovascular: Normal rate and regular rhythm.   No murmur heard. Pulmonary/Chest: Effort normal and breath sounds normal. No respiratory distress. She has no wheezes. She has no rales.  Musculoskeletal: She exhibits no edema.  Lymphadenopathy:    She has no cervical adenopathy.  Neurological: She is oriented to person, place, and time.  Skin: No rash noted.  Psychiatric: She has a normal mood and affect. Her behavior is normal.          Assessment & Plan:  #1 hypothyroidism. Recheck TSH and follow up for physical in 2 months #2 obesity. Discussed strategies for weight loss. One time refill phentermine. We discussed potential risk with long-term use #3 mildly elevated blood pressure here initially 142/92 with subsequent repeat after rest 128/78. Continue close monitoring

## 2011-04-22 ENCOUNTER — Other Ambulatory Visit (INDEPENDENT_AMBULATORY_CARE_PROVIDER_SITE_OTHER): Payer: BC Managed Care – PPO

## 2011-04-22 DIAGNOSIS — Z0000 Encounter for general adult medical examination without abnormal findings: Secondary | ICD-10-CM

## 2011-04-22 LAB — CBC WITH DIFFERENTIAL/PLATELET
Basophils Absolute: 0 10*3/uL (ref 0.0–0.1)
Eosinophils Absolute: 0.2 10*3/uL (ref 0.0–0.7)
HCT: 38.1 % (ref 36.0–46.0)
Lymphs Abs: 2.9 10*3/uL (ref 0.7–4.0)
MCV: 90.8 fl (ref 78.0–100.0)
Monocytes Absolute: 0.6 10*3/uL (ref 0.1–1.0)
Neutrophils Relative %: 61.1 % (ref 43.0–77.0)
Platelets: 271 10*3/uL (ref 150.0–400.0)
RDW: 14.1 % (ref 11.5–14.6)

## 2011-04-22 LAB — BASIC METABOLIC PANEL
BUN: 13 mg/dL (ref 6–23)
Chloride: 104 mEq/L (ref 96–112)
Creatinine, Ser: 0.7 mg/dL (ref 0.4–1.2)
Glucose, Bld: 92 mg/dL (ref 70–99)
Potassium: 4 mEq/L (ref 3.5–5.1)

## 2011-04-22 LAB — POCT URINALYSIS DIPSTICK
Bilirubin, UA: NEGATIVE
Glucose, UA: NEGATIVE
Nitrite, UA: NEGATIVE
pH, UA: 6.5

## 2011-04-22 LAB — TSH: TSH: 0.84 u[IU]/mL (ref 0.35–5.50)

## 2011-04-22 LAB — HEPATIC FUNCTION PANEL
Bilirubin, Direct: 0 mg/dL (ref 0.0–0.3)
Total Bilirubin: 0.4 mg/dL (ref 0.3–1.2)

## 2011-04-22 LAB — LIPID PANEL
Cholesterol: 162 mg/dL (ref 0–200)
HDL: 58.5 mg/dL (ref >39.00)

## 2011-04-29 ENCOUNTER — Ambulatory Visit (INDEPENDENT_AMBULATORY_CARE_PROVIDER_SITE_OTHER): Payer: BC Managed Care – PPO | Admitting: Family Medicine

## 2011-04-29 ENCOUNTER — Encounter: Payer: Self-pay | Admitting: Family Medicine

## 2011-04-29 VITALS — BP 118/78 | HR 72 | Temp 98.3°F | Resp 12 | Ht 63.5 in | Wt 192.0 lb

## 2011-04-29 DIAGNOSIS — Z0000 Encounter for general adult medical examination without abnormal findings: Secondary | ICD-10-CM

## 2011-04-29 MED ORDER — PHENTERMINE HCL 37.5 MG PO TABS: 37.5000 mg | ORAL_TABLET | Freq: Every day | ORAL | Status: DC

## 2011-04-29 MED ORDER — LEVOTHYROXINE SODIUM 75 MCG PO TABS: 75.0000 ug | ORAL_TABLET | Freq: Every day | ORAL | Status: DC

## 2011-04-29 NOTE — Patient Instructions (Signed)
Try to establish regular exercise. 

## 2011-04-29 NOTE — Progress Notes (Signed)
  Subjective:    Patient ID: Kayla Kelley, female    DOB: [birth year], 46 y.o.   MRN: [id number]  HPI  Complete physical. Patient sees GYN. Has never had mammogram. Tetanus up-to-date. Currently not exercising. Nonsmoker. Past medical history reviewed as below. She has hypothyroidism treated with levothyroxine. Compliant with therapy. She has history of obesity and we have discussed diet and exercise in some detail. She has previously used phentermine short-term and realizes this is not a long-term medication option.  Past Medical History  Diagnosis Date  . Tachycardia   . Benign essential hypertension   . Hidradenitis suppurativa   . Narcolepsy without cataplexy   . Anemia   . Routine physical examination   . Muscle spasm of back   . Sciatica     acute  . Lumbosacral strain     acute  . GERD (gastroesophageal reflux disease)   . UTI (urinary tract infection)   . PMS (premenstrual syndrome)   . Sleep apnea   . Fatigue   . Overweight   . Hypothyroidism    No past surgical history on file.  reports that she has never smoked. She has never used smokeless tobacco. She reports that she does not drink alcohol or use illicit drugs. family history includes Diabetes in her father, mother, paternal aunt, paternal grandfather, and paternal grandmother and Heart disease (age of onset:60) in her mother. Allergies  Allergen Reactions  . Ampicillin     hives      Review of Systems  Constitutional: Negative for fever, activity change, appetite change, fatigue and unexpected weight change.  HENT: Negative for hearing loss, ear pain, sore throat and trouble swallowing.   Eyes: Negative for visual disturbance.  Respiratory: Negative for cough and shortness of breath.   Cardiovascular: Negative for chest pain and palpitations.  Gastrointestinal: Negative for abdominal pain, diarrhea, constipation and blood in stool.  Genitourinary: Negative for dysuria and hematuria.  Musculoskeletal:  Negative for myalgias, back pain and arthralgias.  Skin: Negative for rash.  Neurological: Negative for dizziness, syncope and headaches.  Hematological: Negative for adenopathy.  Psychiatric/Behavioral: Negative for confusion and dysphoric mood.       Objective:   Physical Exam  Constitutional: She is oriented to person, place, and time. She appears well-developed and well-nourished.  HENT:  Head: Normocephalic and atraumatic.  Eyes: EOM are normal. Pupils are equal, round, and reactive to light.  Neck: Normal range of motion. Neck supple. No thyromegaly present.  Cardiovascular: Normal rate, regular rhythm and normal heart sounds.   No murmur heard. Pulmonary/Chest: Breath sounds normal. No respiratory distress. She has no wheezes. She has no rales.  Abdominal: Soft. Bowel sounds are normal. She exhibits no distension and no mass. There is no tenderness. There is no rebound and no guarding.  Musculoskeletal: Normal range of motion. She exhibits no edema.  Lymphadenopathy:    She has no cervical adenopathy.  Neurological: She is alert and oriented to person, place, and time. She displays normal reflexes. No cranial nerve deficit.  Skin: No rash noted.  Psychiatric: She has a normal mood and affect. Her behavior is normal. Judgment and thought content normal.          Assessment & Plan:  Complete physical. Labs reviewed with patient and favorable. Refilled levothyroxin for one year. Establish regular exercise. She is encouraged to consider mammogram and she will discuss this with gynecologist.

## 2011-07-12 ENCOUNTER — Other Ambulatory Visit: Payer: Self-pay | Admitting: Family Medicine

## 2012-05-25 ENCOUNTER — Other Ambulatory Visit: Payer: Self-pay | Admitting: Family Medicine

## 2012-07-06 ENCOUNTER — Other Ambulatory Visit: Payer: Self-pay | Admitting: Family Medicine

## 2012-07-09 ENCOUNTER — Ambulatory Visit: Payer: BC Managed Care – PPO | Admitting: Family Medicine

## 2012-07-09 DIAGNOSIS — Z0289 Encounter for other administrative examinations: Secondary | ICD-10-CM

## 2012-07-15 ENCOUNTER — Encounter: Payer: Self-pay | Admitting: Family Medicine

## 2012-07-28 ENCOUNTER — Ambulatory Visit (INDEPENDENT_AMBULATORY_CARE_PROVIDER_SITE_OTHER): Payer: Managed Care, Other (non HMO) | Admitting: Family Medicine

## 2012-07-28 VITALS — BP 128/86 | HR 102 | Temp 98.0°F | Resp 14 | Wt 198.0 lb

## 2012-07-28 DIAGNOSIS — E039 Hypothyroidism, unspecified: Secondary | ICD-10-CM

## 2012-07-28 NOTE — Progress Notes (Signed)
  Subjective:    Patient ID: Kayla Kelley, female    DOB: [birth year], 47 y.o.   MRN: [id number]  HPI Followup hypothyroidism Needs followup lab work. Denies any cold intolerance, constipation, or fatigue She has no complaints this time. Compliant with medication. Takes Synthroid 75 mcg daily Takes intermittent omeprazole for GERD symptoms. No active GERD  Past Medical History  Diagnosis Date  . Tachycardia   . Benign essential hypertension   . Hidradenitis suppurativa   . Narcolepsy without cataplexy   . Anemia   . Routine physical examination   . Muscle spasm of back   . Sciatica     acute  . Lumbosacral strain     acute  . GERD (gastroesophageal reflux disease)   . UTI (urinary tract infection)   . PMS (premenstrual syndrome)   . Sleep apnea   . Fatigue   . Overweight   . Hypothyroidism    No past surgical history on file.  reports that she has never smoked. She has never used smokeless tobacco. She reports that she does not drink alcohol or use illicit drugs. family history includes Diabetes in her father, mother, paternal aunt, paternal grandfather, and paternal grandmother and Heart disease (age of onset: 48) in her mother. Allergies  Allergen Reactions  . Ampicillin     hives      Review of Systems  Constitutional: Negative for appetite change and unexpected weight change.  Respiratory: Negative for shortness of breath.   Cardiovascular: Negative for chest pain.  Endocrine: Negative for cold intolerance, polydipsia and polyuria.       Objective:   Physical Exam  Constitutional: She appears well-developed and well-nourished.  Neck: Neck supple. No thyromegaly present.  Cardiovascular: Normal rate and regular rhythm.   No murmur heard. Pulmonary/Chest: Effort normal and breath sounds normal. No respiratory distress. She has no wheezes. She has no rales.  Musculoskeletal: She exhibits no edema.          Assessment & Plan:  Hypothyroidism. Recheck  TSH. She does not need med refills at this time.

## 2012-08-23 ENCOUNTER — Other Ambulatory Visit: Payer: Self-pay

## 2012-08-23 MED ORDER — LEVOTHYROXINE SODIUM 75 MCG PO TABS: 75.0000 ug | ORAL_TABLET | Freq: Every day | ORAL | Status: DC

## 2012-09-23 ENCOUNTER — Encounter: Payer: Self-pay | Admitting: Family

## 2012-09-23 ENCOUNTER — Ambulatory Visit (INDEPENDENT_AMBULATORY_CARE_PROVIDER_SITE_OTHER): Payer: Managed Care, Other (non HMO) | Admitting: Family

## 2012-09-23 VITALS — BP 132/92 | HR 102 | Wt 195.0 lb

## 2012-09-23 DIAGNOSIS — J309 Allergic rhinitis, unspecified: Secondary | ICD-10-CM

## 2012-09-23 DIAGNOSIS — R5381 Other malaise: Secondary | ICD-10-CM

## 2012-09-23 MED ORDER — FEXOFENADINE-PSEUDOEPHED ER 180-240 MG PO TB24: 1.0000 | ORAL_TABLET | Freq: Every day | ORAL | Status: AC

## 2012-09-23 MED ORDER — FLUTICASONE PROPIONATE 50 MCG/ACT NA SUSP: 2.0000 | Freq: Every day | NASAL | Status: DC

## 2012-09-23 NOTE — Patient Instructions (Addendum)
1. Allegra D once daily 2. Flonase 2 sprays in each nostril once a day

## 2012-09-23 NOTE — Progress Notes (Signed)
Subjective:    Patient ID: Kayla Kelley, female    DOB: 09/12/1965, 47 y.o.   MRN: [id number]  HPI 47 year old female, nonsmoker, patient of Dr. Caryl Never is in today with complaints of sneezing, sinus pressure, congestion x3 days. She has been taking over-the-counter NyQuil and DayQuil with no relief. Reports fatigue. Works 2 jobs.   Review of Systems  Constitutional: Positive for fatigue. Negative for fever and appetite change.  HENT: Positive for congestion, sore throat, sneezing and postnasal drip.   Respiratory: Positive for cough. Negative for shortness of breath and wheezing.   Cardiovascular: Negative.   Musculoskeletal: Negative.   Skin: Negative.   Allergic/Immunologic: Positive for environmental allergies.  Neurological: Negative.   Hematological: Negative.   Psychiatric/Behavioral: Negative.    Past Medical History  Diagnosis Date  . Tachycardia   . Benign essential hypertension   . Hidradenitis suppurativa   . Narcolepsy without cataplexy(347.00)   . Anemia   . Routine physical examination   . Muscle spasm of back   . Sciatica     acute  . Lumbosacral strain     acute  . GERD (gastroesophageal reflux disease)   . UTI (urinary tract infection)   . PMS (premenstrual syndrome)   . Sleep apnea   . Fatigue   . Overweight(278.02)   . Hypothyroidism     History   Social History  . Marital Status: Married    Spouse Name: N/A    Number of Children: N/A  . Years of Education: N/A   Occupational History  . Not on file.   Social History Main Topics  . Smoking status: Never Smoker   . Smokeless tobacco: Never Used  . Alcohol Use: No  . Drug Use: No  . Sexual Activity: Yes   Other Topics Concern  . Not on file   Social History Narrative  . No narrative on file    History reviewed. No pertinent past surgical history.  Family History  Problem Relation Age of Onset  . Heart disease Mother 62    CAD  . Diabetes Mother   . Diabetes Father   .  Diabetes Paternal Aunt   . Diabetes Paternal Grandmother   . Diabetes Paternal Grandfather     Allergies  Allergen Reactions  . Ampicillin     hives    Current Outpatient Prescriptions on File Prior to Visit  Medication Sig Dispense Refill  . cyclobenzaprine (FLEXERIL) 10 MG tablet Take 10 mg by mouth as needed.        Marland Kitchen ibuprofen (ADVIL,MOTRIN) 800 MG tablet TAKE ONE TABLET BY MOUTH EVERY 8 HOURS AS NEEDED-FOR INFLAMATION AND PAIN  90 tablet  5  . levothyroxine (SYNTHROID, LEVOTHROID) 75 MCG tablet Take 1 tablet (75 mcg total) by mouth daily.  30 tablet  5  . Multiple Vitamin (MULTIVITAMIN) tablet Take 1 tablet by mouth daily.      Marland Kitchen omeprazole (PRILOSEC) 40 MG capsule Take 40 mg by mouth as needed.       No current facility-administered medications on file prior to visit.    BP 132/92  Pulse 102  Wt 195 lb (88.451 kg)  BMI 34 kg/m2  SpO2 98%  LMP 04/01/2012chart    Objective:   Physical Exam  Constitutional: She is oriented to person, place, and time. She appears well-developed and well-nourished.  HENT:  Right Ear: External ear normal.  Left Ear: External ear normal.  Nasal turbinates red and swollen  Neck: Normal range of motion. Neck  supple. No thyromegaly present.  Cardiovascular: Normal rate, regular rhythm and normal heart sounds.   Pulmonary/Chest: Effort normal and breath sounds normal.  Neurological: She is alert and oriented to person, place, and time.  Skin: Skin is warm and dry.  Psychiatric: She has a normal mood and affect.          Assessment & Plan:  Assessment: 1. Allergic rhinitis 2. Fatigue  Plan: Flonase 2 sprays in each for one today. Allegra-D 24 hours once daily. Patient upon the office if symptoms worsen or persist. Recheck as scheduled, and as needed.

## 2013-04-28 ENCOUNTER — Other Ambulatory Visit: Payer: Self-pay | Admitting: Family Medicine

## 2013-05-03 ENCOUNTER — Telehealth: Payer: Self-pay | Admitting: Family Medicine

## 2013-05-03 NOTE — Telephone Encounter (Signed)
Pt is needing new rx for levothyroxine (SYNTHROID, LEVOTHROID) 75 MCG tablet, pt is out of meds, no refills left at pharmacy, pt has scheduled an appointment on 05/18/13, need to know if she can get enough to last until the appt. Send to [facility] on [address].

## 2013-05-04 MED ORDER — LEVOTHYROXINE SODIUM 75 MCG PO TABS: ORAL_TABLET | ORAL | Status: DC

## 2013-05-04 NOTE — Telephone Encounter (Signed)
RX sent to pharmacy  

## 2013-05-10 MED ORDER — LEVOTHYROXINE SODIUM 75 MCG PO TABS: ORAL_TABLET | ORAL | Status: DC

## 2013-05-10 NOTE — Telephone Encounter (Addendum)
Pt states her levothyroxine (SYNTHROID, LEVOTHROID) 75 MCG tablet  Is not at [facility] on [address]. Can you call this in again?   pt has appt 4/15

## 2013-05-10 NOTE — Addendum Note (Signed)
Addended by: Noe Gens E on: 05/10/2013 11:25 AM   Modules accepted: Orders

## 2013-05-10 NOTE — Telephone Encounter (Signed)
RX sent to pharmacy  

## 2013-05-18 ENCOUNTER — Ambulatory Visit (INDEPENDENT_AMBULATORY_CARE_PROVIDER_SITE_OTHER): Payer: PRIVATE HEALTH INSURANCE | Admitting: Family Medicine

## 2013-05-18 ENCOUNTER — Encounter: Payer: Self-pay | Admitting: Family Medicine

## 2013-05-18 VITALS — HR 102 | Wt 199.0 lb

## 2013-05-18 DIAGNOSIS — E039 Hypothyroidism, unspecified: Secondary | ICD-10-CM

## 2013-05-18 MED ORDER — LEVOTHYROXINE SODIUM 75 MCG PO TABS: ORAL_TABLET | ORAL | Status: DC

## 2013-05-18 NOTE — Progress Notes (Signed)
Pre visit review using our clinic review tool, if applicable. No additional management support is needed unless otherwise documented below in the visit note. 

## 2013-05-18 NOTE — Patient Instructions (Addendum)
Hypothyroidism The thyroid is a large gland located in the lower front of your neck. The thyroid gland helps control metabolism. Metabolism is how your body handles food. It controls metabolism with the hormone thyroxine. When this gland is underactive (hypothyroid), it produces too little hormone.  CAUSES These include:   Absence or destruction of thyroid tissue.  Goiter due to iodine deficiency.  Goiter due to medications.  Congenital defects (since birth).  Problems with the pituitary. This causes a lack of TSH (thyroid stimulating hormone). This hormone tells the thyroid to turn out more hormone. SYMPTOMS  Lethargy (feeling as though you have no energy)  Cold intolerance  Weight gain (in spite of normal food intake)  Dry skin  Coarse hair  Menstrual irregularity (if severe, may lead to infertility)  Slowing of thought processes Cardiac problems are also caused by insufficient amounts of thyroid hormone. Hypothyroidism in the newborn is cretinism, and is an extreme form. It is important that this form be treated adequately and immediately or it will lead rapidly to retarded physical and mental development. DIAGNOSIS  To prove hypothyroidism, your caregiver may do blood tests and ultrasound tests. Sometimes the signs are hidden. It may be necessary for your caregiver to watch this illness with blood tests either before or after diagnosis and treatment. TREATMENT  Low levels of thyroid hormone are increased by using synthetic thyroid hormone. This is a safe, effective treatment. It usually takes about four weeks to gain the full effects of the medication. After you have the full effect of the medication, it will generally take another four weeks for problems to leave. Your caregiver may start you on low doses. If you have had heart problems the dose may be gradually increased. It is generally not an emergency to get rapidly to normal. HOME CARE INSTRUCTIONS   Take your  medications as your caregiver suggests. Let your caregiver know of any medications you are taking or start taking. Your caregiver will help you with dosage schedules.  As your condition improves, your dosage needs may increase. It will be necessary to have continuing blood tests as suggested by your caregiver.  Report all suspected medication side effects to your caregiver. SEEK MEDICAL CARE IF: Seek medical care if you develop:  Sweating.  Tremulousness (tremors).  Anxiety.  Rapid weight loss.  Heat intolerance.  Emotional swings.  Diarrhea.  Weakness. SEEK IMMEDIATE MEDICAL CARE IF:  You develop chest pain, an irregular heart beat (palpitations), or a rapid heart beat. MAKE SURE YOU:   Understand these instructions.  Will watch your condition.  Will get help right away if you are not doing well or get worse. Document Released: 01/20/2005 Document Revised: 04/14/2011 Document Reviewed: 09/10/2007 [facility] Patient Information 2014 [place].  Schedule complete physical.

## 2013-05-18 NOTE — Progress Notes (Signed)
   Subjective:    Patient ID: Kayla Kelley, female    DOB: 07/17/1965, 48 y.o.   MRN: [id number]  HPI Patient here for followup hypothyroidism. She takes levothyroxin 75 mcg daily. Compliant with medications. No specific complaints. She plans to schedule complete physical soon. She has some seasonal allergies and takes over-the-counter medications for that.  Past Medical History  Diagnosis Date  . Tachycardia   . Benign essential hypertension   . Hidradenitis suppurativa   . Narcolepsy without cataplexy(347.00)   . Anemia   . Routine physical examination   . Muscle spasm of back   . Sciatica     acute  . Lumbosacral strain     acute  . GERD (gastroesophageal reflux disease)   . UTI (urinary tract infection)   . PMS (premenstrual syndrome)   . Sleep apnea   . Fatigue   . Overweight   . Hypothyroidism    No past surgical history on file.  reports that she has never smoked. She has never used smokeless tobacco. She reports that she does not drink alcohol or use illicit drugs. family history includes Diabetes in her father, mother, paternal aunt, paternal grandfather, and paternal grandmother; Heart disease (age of onset: 57) in her mother. Allergies  Allergen Reactions  . Ampicillin     hives      Review of Systems  Constitutional: Negative for appetite change and unexpected weight change.  Respiratory: Negative for shortness of breath.   Cardiovascular: Negative for chest pain.       Objective:   Physical Exam  Constitutional: She appears well-developed and well-nourished.  Neck: Neck supple. No thyromegaly present.  Cardiovascular: Normal rate and regular rhythm.   Pulmonary/Chest: Effort normal and breath sounds normal. No respiratory distress. She has no wheezes. She has no rales.  Musculoskeletal: She exhibits no edema.          Assessment & Plan:  Hypothyroidism. Refill levothyroxine for one year. She'll schedule complete physical and get lab work  including TSH at that time

## 2013-05-18 NOTE — Progress Notes (Signed)
   Subjective:    Patient ID: Kayla Kelley, female    DOB: December 28, 1965, 48 y.o.   MRN: [id number]  HPI    Review of Systems     Objective:   Physical Exam        Assessment & Plan:

## 2013-07-12 ENCOUNTER — Other Ambulatory Visit (INDEPENDENT_AMBULATORY_CARE_PROVIDER_SITE_OTHER): Payer: PRIVATE HEALTH INSURANCE

## 2013-07-12 DIAGNOSIS — Z0000 Encounter for general adult medical examination without abnormal findings: Secondary | ICD-10-CM

## 2013-07-12 LAB — CBC WITH DIFFERENTIAL/PLATELET
BASOS ABS: 0.1 10*3/uL (ref 0.0–0.1)
Basophils Relative: 0.7 % (ref 0.0–3.0)
EOS PCT: 4.2 % (ref 0.0–5.0)
Eosinophils Absolute: 0.4 10*3/uL (ref 0.0–0.7)
HCT: 35.7 % — ABNORMAL LOW (ref 36.0–46.0)
Hemoglobin: 12 g/dL (ref 12.0–15.0)
LYMPHS ABS: 3 10*3/uL (ref 0.7–4.0)
LYMPHS PCT: 31.7 % (ref 12.0–46.0)
MCHC: 33.5 g/dL (ref 30.0–36.0)
MCV: 89.5 fl (ref 78.0–100.0)
Monocytes Absolute: 0.6 10*3/uL (ref 0.1–1.0)
Monocytes Relative: 6.5 % (ref 3.0–12.0)
NEUTROS PCT: 56.9 % (ref 43.0–77.0)
Neutro Abs: 5.4 10*3/uL (ref 1.4–7.7)
PLATELETS: 311 10*3/uL (ref 150.0–400.0)
RBC: 3.99 Mil/uL (ref 3.87–5.11)
RDW: 14.1 % (ref 11.5–15.5)
WBC: 9.4 10*3/uL (ref 4.0–10.5)

## 2013-07-12 LAB — BASIC METABOLIC PANEL
BUN: 13 mg/dL (ref 6–23)
CALCIUM: 8.6 mg/dL (ref 8.4–10.5)
CO2: 27 mEq/L (ref 19–32)
CREATININE: 0.7 mg/dL (ref 0.4–1.2)
Chloride: 107 mEq/L (ref 96–112)
GFR: 92.09 mL/min (ref >60.00)
GLUCOSE: 89 mg/dL (ref 70–99)
Potassium: 3.9 mEq/L (ref 3.5–5.1)
Sodium: 139 mEq/L (ref 135–145)

## 2013-07-12 LAB — LIPID PANEL
CHOL/HDL RATIO: 3
Cholesterol: 151 mg/dL (ref 0–200)
HDL: 47.6 mg/dL (ref >39.00)
LDL Cholesterol: 90 mg/dL (ref 0–99)
NonHDL: 103.4
TRIGLYCERIDES: 65 mg/dL (ref 0.0–149.0)
VLDL: 13 mg/dL (ref 0.0–40.0)

## 2013-07-12 LAB — HEPATIC FUNCTION PANEL
ALT: 19 U/L (ref 0–35)
AST: 16 U/L (ref 0–37)
Albumin: 3.5 g/dL (ref 3.5–5.2)
Alkaline Phosphatase: 71 U/L (ref 39–117)
BILIRUBIN DIRECT: 0 mg/dL (ref 0.0–0.3)
BILIRUBIN TOTAL: 0.4 mg/dL (ref 0.2–1.2)
Total Protein: 6.4 g/dL (ref 6.0–8.3)

## 2013-07-12 LAB — POCT URINALYSIS DIPSTICK
BILIRUBIN UA: NEGATIVE
Glucose, UA: NEGATIVE
KETONES UA: NEGATIVE
Nitrite, UA: NEGATIVE
Protein, UA: NEGATIVE
RBC UA: NEGATIVE
Spec Grav, UA: 1.015
Urobilinogen, UA: 0.2
pH, UA: 6.5

## 2013-07-12 LAB — TSH: TSH: 3.1 u[IU]/mL (ref 0.35–4.50)

## 2013-07-18 ENCOUNTER — Encounter: Payer: Self-pay | Admitting: Family Medicine

## 2013-07-18 ENCOUNTER — Ambulatory Visit (INDEPENDENT_AMBULATORY_CARE_PROVIDER_SITE_OTHER): Payer: PRIVATE HEALTH INSURANCE | Admitting: Family Medicine

## 2013-07-18 VITALS — BP 130/90 | HR 103 | Temp 98.5°F | Ht 64.0 in | Wt 200.0 lb

## 2013-07-18 DIAGNOSIS — Z0000 Encounter for general adult medical examination without abnormal findings: Secondary | ICD-10-CM

## 2013-07-18 DIAGNOSIS — L919 Hypertrophic disorder of the skin, unspecified: Secondary | ICD-10-CM

## 2013-07-18 DIAGNOSIS — L918 Other hypertrophic disorders of the skin: Secondary | ICD-10-CM

## 2013-07-18 DIAGNOSIS — L909 Atrophic disorder of skin, unspecified: Secondary | ICD-10-CM

## 2013-07-18 NOTE — Patient Instructions (Signed)

## 2013-07-18 NOTE — Progress Notes (Signed)
Pre visit review using our clinic review tool, if applicable. No additional management support is needed unless otherwise documented below in the visit note. 

## 2013-07-18 NOTE — Progress Notes (Signed)
Subjective:    Patient ID: Kayla Kelley, female    DOB: 05-15-1965, 48 y.o.   MRN: [id number]  HPI  For complete physical. She sees gynecologist and is due for repeat Pap smear. She plans to set that up soon. She has history of hypothyroidism and is on treatment. She has had recent problems with bilateral plantar fasciitis.  Nonsmoker. She's battled with weight issues much of her life and has previously taken phentermine. She is tracking her calories and steps and to plans to lose some weight on her own.  Past Medical History  Diagnosis Date  . Tachycardia   . Benign essential hypertension   . Hidradenitis suppurativa   . Narcolepsy without cataplexy(347.00)   . Anemia   . Routine physical examination   . Muscle spasm of back   . Sciatica     acute  . Lumbosacral strain     acute  . GERD (gastroesophageal reflux disease)   . UTI (urinary tract infection)   . PMS (premenstrual syndrome)   . Sleep apnea   . Fatigue   . Overweight   . Hypothyroidism    No past surgical history on file.  reports that she has never smoked. She has never used smokeless tobacco. She reports that she does not drink alcohol or use illicit drugs. family history includes Diabetes in her father, mother, paternal aunt, paternal grandfather, and paternal grandmother; Heart disease (age of onset: 39) in her mother. Allergies  Allergen Reactions  . Ampicillin     hives      Review of Systems  Constitutional: Negative for fever, activity change, appetite change, fatigue and unexpected weight change.  HENT: Negative for ear pain, hearing loss, sore throat and trouble swallowing.   Eyes: Negative for visual disturbance.  Respiratory: Negative for cough and shortness of breath.   Cardiovascular: Negative for chest pain and palpitations.  Gastrointestinal: Negative for abdominal pain, diarrhea, constipation and blood in stool.  Genitourinary: Negative for dysuria and hematuria.  Musculoskeletal:  Negative for arthralgias, back pain and myalgias.  Skin: Negative for rash.  Neurological: Negative for dizziness, syncope and headaches.  Hematological: Negative for adenopathy.  Psychiatric/Behavioral: Negative for confusion and dysphoric mood.       Objective:   Physical Exam  Constitutional: She is oriented to person, place, and time. She appears well-developed and well-nourished.  HENT:  Head: Normocephalic and atraumatic.  Eyes: EOM are normal. Pupils are equal, round, and reactive to light.  Neck: Normal range of motion. Neck supple. No thyromegaly present.  Cardiovascular: Normal rate, regular rhythm and normal heart sounds.   No murmur heard. Pulmonary/Chest: Breath sounds normal. No respiratory distress. She has no wheezes. She has no rales.  Abdominal: Soft. Bowel sounds are normal. She exhibits no distension and no mass. There is no tenderness. There is no rebound and no guarding.  Musculoskeletal: Normal range of motion. She exhibits no edema.  Lymphadenopathy:    She has no cervical adenopathy.  Neurological: She is alert and oriented to person, place, and time. She displays normal reflexes. No cranial nerve deficit.  Skin: No rash noted.  3 benign appearing skin tags left axilla.  Psychiatric: She has a normal mood and affect. Her behavior is normal. Judgment and thought content normal.          Assessment & Plan:  Physical exam. We discussed strategies for weight loss. Labs reviewed with no major concerns. Tetanus up-to-date. She'll continue GYN followup. We discussed plantar fasciitis. She  has some benign-appearing skin tags left axillary region. These are irritating b/o  location. After discussion risks and benefits we treated 3 days with liquid nitrogen and she tolerated well

## 2014-07-18 ENCOUNTER — Ambulatory Visit (INDEPENDENT_AMBULATORY_CARE_PROVIDER_SITE_OTHER): Payer: 59 | Admitting: Family Medicine

## 2014-07-18 ENCOUNTER — Encounter: Payer: Self-pay | Admitting: Family Medicine

## 2014-07-18 VITALS — BP 120/90 | HR 84 | Temp 98.2°F | Ht 64.0 in | Wt 200.0 lb

## 2014-07-18 DIAGNOSIS — M5441 Lumbago with sciatica, right side: Secondary | ICD-10-CM

## 2014-07-18 MED ORDER — CYCLOBENZAPRINE HCL 5 MG PO TABS: 5.0000 mg | ORAL_TABLET | Freq: Three times a day (TID) | ORAL | Status: DC | PRN

## 2014-07-18 NOTE — Patient Instructions (Signed)
Low Back Sprain with Rehab  A sprain is an injury in which a ligament is torn. The ligaments of the lower back are vulnerable to sprains. However, they are strong and require great force to be injured. These ligaments are important for stabilizing the spinal column. Sprains are classified into three categories. Grade 1 sprains cause pain, but the tendon is not lengthened. Grade 2 sprains include a lengthened ligament, due to the ligament being stretched or partially ruptured. With grade 2 sprains there is still function, although the function may be decreased. Grade 3 sprains involve a complete tear of the tendon or muscle, and function is usually impaired. SYMPTOMS   Severe pain in the lower back.  Sometimes, a feeling of a "pop," "snap," or tear, at the time of injury.  Tenderness and sometimes swelling at the injury site.  Uncommonly, bruising (contusion) within 48 hours of injury.  Muscle spasms in the back. CAUSES  Low back sprains occur when a force is placed on the ligaments that is greater than they can handle. Common causes of injury include:  Performing a stressful act while off-balance.  Repetitive stressful activities that involve movement of the lower back.  Direct hit (trauma) to the lower back. RISK INCREASES WITH:  Contact sports (football, wrestling).  Collisions (major skiing accidents).  Sports that require throwing or lifting (baseball, weightlifting).  Sports involving twisting of the spine (gymnastics, diving, tennis, golf).  Poor strength and flexibility.  Inadequate protection.  Previous back injury or surgery (especially fusion). PREVENTION  Wear properly fitted and padded protective equipment.  Warm up and stretch properly before activity.  Allow for adequate recovery between workouts.  Maintain physical fitness:  Strength, flexibility, and endurance.  Cardiovascular fitness.  Maintain a healthy body weight. PROGNOSIS  If treated  properly, low back sprains usually heal with non-surgical treatment. The length of time for healing depends on the severity of the injury.  RELATED COMPLICATIONS   Recurring symptoms, resulting in a chronic problem.  Chronic inflammation and pain in the low back.  Delayed healing or resolution of symptoms, especially if activity is resumed too soon.  Prolonged impairment.  Unstable or arthritic joints of the low back. TREATMENT  Treatment first involves the use of ice and medicine, to reduce pain and inflammation. The use of strengthening and stretching exercises may help reduce pain with activity. These exercises may be performed at home or with a therapist. Severe injuries may require referral to a therapist for further evaluation and treatment, such as ultrasound. Your caregiver may advise that you wear a back brace or corset, to help reduce pain and discomfort. Often, prolonged bed rest results in greater harm then benefit. Corticosteroid injections may be recommended. However, these should be reserved for the most serious cases. It is important to avoid using your back when lifting objects. At night, sleep on your back on a firm mattress, with a pillow placed under your knees. If non-surgical treatment is unsuccessful, surgery may be needed.  MEDICATION   If pain medicine is needed, nonsteroidal anti-inflammatory medicines (aspirin and ibuprofen), or other minor pain relievers (acetaminophen), are often advised.  Do not take pain medicine for 7 days before surgery.  Prescription pain relievers may be given, if your caregiver thinks they are needed. Use only as directed and only as much as you need.  Ointments applied to the skin may be helpful.  Corticosteroid injections may be given by your caregiver. These injections should be reserved for the most serious cases,   because they may only be given a certain number of times. HEAT AND COLD  Cold treatment (icing) should be applied for 10  to 15 minutes every 2 to 3 hours for inflammation and pain, and immediately after activity that aggravates your symptoms. Use ice packs or an ice massage.  Heat treatment may be used before performing stretching and strengthening activities prescribed by your caregiver, physical therapist, or athletic trainer. Use a heat pack or a warm water soak. SEEK MEDICAL CARE IF:   Symptoms get worse or do not improve in 2 to 4 weeks, despite treatment.  You develop numbness or weakness in either leg.  You lose bowel or bladder function.  Any of the following occur after surgery: fever, increased pain, swelling, redness, drainage of fluids, or bleeding in the affected area.  New, unexplained symptoms develop. (Drugs used in treatment may produce side effects.) EXERCISES  RANGE OF MOTION (ROM) AND STRETCHING EXERCISES - Low Back Sprain Most people with lower back pain will find that their symptoms get worse with excessive bending forward (flexion) or arching at the lower back (extension). The exercises that will help resolve your symptoms will focus on the opposite motion.  Your physician, physical therapist or athletic trainer will help you determine which exercises will be most helpful to resolve your lower back pain. Do not complete any exercises without first consulting with your caregiver. Discontinue any exercises which make your symptoms worse, until you speak to your caregiver. If you have pain, numbness or tingling which travels down into your buttocks, leg or foot, the goal of the therapy is for these symptoms to move closer to your back and eventually resolve. Sometimes, these leg symptoms will get better, but your lower back pain may worsen. This is often an indication of progress in your rehabilitation. Be very alert to any changes in your symptoms and the activities in which you participated in the 24 hours prior to the change. Sharing this information with your caregiver will allow him or her to  most efficiently treat your condition. These exercises may help you when beginning to rehabilitate your injury. Your symptoms may resolve with or without further involvement from your physician, physical therapist or athletic trainer. While completing these exercises, remember:   Restoring tissue flexibility helps normal motion to return to the joints. This allows healthier, less painful movement and activity.  An effective stretch should be held for at least 30 seconds.  A stretch should never be painful. You should only feel a gentle lengthening or release in the stretched tissue. FLEXION RANGE OF MOTION AND STRETCHING EXERCISES: STRETCH - Flexion, Single Knee to Chest   Lie on a firm bed or floor with both legs extended in front of you.  Keeping one leg in contact with the floor, bring your opposite knee to your chest. Hold your leg in place by either grabbing behind your thigh or at your knee.  Pull until you feel a gentle stretch in your low back. Hold __________ seconds.  Slowly release your grasp and repeat the exercise with the opposite side. Repeat __________ times. Complete this exercise __________ times per day.  STRETCH - Flexion, Double Knee to Chest  Lie on a firm bed or floor with both legs extended in front of you.  Keeping one leg in contact with the floor, bring your opposite knee to your chest.  Tense your stomach muscles to support your back and then lift your other knee to your chest. Hold your legs   in place by either grabbing behind your thighs or at your knees.  Pull both knees toward your chest until you feel a gentle stretch in your low back. Hold __________ seconds.  Tense your stomach muscles and slowly return one leg at a time to the floor. Repeat __________ times. Complete this exercise __________ times per day.  STRETCH - Low Trunk Rotation  Lie on a firm bed or floor. Keeping your legs in front of you, bend your knees so they are both pointed toward the  ceiling and your feet are flat on the floor.  Extend your arms out to the side. This will stabilize your upper body by keeping your shoulders in contact with the floor.  Gently and slowly drop both knees together to one side until you feel a gentle stretch in your low back. Hold for __________ seconds.  Tense your stomach muscles to support your lower back as you bring your knees back to the starting position. Repeat the exercise to the other side. Repeat __________ times. Complete this exercise __________ times per day  EXTENSION RANGE OF MOTION AND FLEXIBILITY EXERCISES: STRETCH - Extension, Prone on Elbows   Lie on your stomach on the floor, a bed will be too soft. Place your palms about shoulder width apart and at the height of your head.  Place your elbows under your shoulders. If this is too painful, stack pillows under your chest.  Allow your body to relax so that your hips drop lower and make contact more completely with the floor.  Hold this position for __________ seconds.  Slowly return to lying flat on the floor. Repeat __________ times. Complete this exercise __________ times per day.  RANGE OF MOTION - Extension, Prone Press Ups  Lie on your stomach on the floor, a bed will be too soft. Place your palms about shoulder width apart and at the height of your head.  Keeping your back as relaxed as possible, slowly straighten your elbows while keeping your hips on the floor. You may adjust the placement of your hands to maximize your comfort. As you gain motion, your hands will come more underneath your shoulders.  Hold this position __________ seconds.  Slowly return to lying flat on the floor. Repeat __________ times. Complete this exercise __________ times per day.  RANGE OF MOTION- Quadruped, Neutral Spine   Assume a hands and knees position on a firm surface. Keep your hands under your shoulders and your knees under your hips. You may place padding under your knees for  comfort.  Drop your head and point your tailbone toward the ground below you. This will round out your lower back like an angry cat. Hold this position for __________ seconds.  Slowly lift your head and release your tail bone so that your back sags into a large arch, like an old horse.  Hold this position for __________ seconds.  Repeat this until you feel limber in your low back.  Now, find your "sweet spot." This will be the most comfortable position somewhere between the two previous positions. This is your neutral spine. Once you have found this position, tense your stomach muscles to support your low back.  Hold this position for __________ seconds. Repeat __________ times. Complete this exercise __________ times per day.  STRENGTHENING EXERCISES - Low Back Sprain These exercises may help you when beginning to rehabilitate your injury. These exercises should be done near your "sweet spot." This is the neutral, low-back arch, somewhere between fully rounded   and fully arched, that is your least painful position. When performed in this safe range of motion, these exercises can be used for people who have either a flexion or extension based injury. These exercises may resolve your symptoms with or without further involvement from your physician, physical therapist or athletic trainer. While completing these exercises, remember:   Muscles can gain both the endurance and the strength needed for everyday activities through controlled exercises.  Complete these exercises as instructed by your physician, physical therapist or athletic trainer. Increase the resistance and repetitions only as guided.  You may experience muscle soreness or fatigue, but the pain or discomfort you are trying to eliminate should never worsen during these exercises. If this pain does worsen, stop and make certain you are following the directions exactly. If the pain is still present after adjustments, discontinue the  exercise until you can discuss the trouble with your caregiver. STRENGTHENING - Deep Abdominals, Pelvic Tilt   Lie on a firm bed or floor. Keeping your legs in front of you, bend your knees so they are both pointed toward the ceiling and your feet are flat on the floor.  Tense your lower abdominal muscles to press your low back into the floor. This motion will rotate your pelvis so that your tail bone is scooping upwards rather than pointing at your feet or into the floor. With a gentle tension and even breathing, hold this position for __________ seconds. Repeat __________ times. Complete this exercise __________ times per day.  STRENGTHENING - Abdominals, Crunches   Lie on a firm bed or floor. Keeping your legs in front of you, bend your knees so they are both pointed toward the ceiling and your feet are flat on the floor. Cross your arms over your chest.  Slightly tip your chin down without bending your neck.  Tense your abdominals and slowly lift your trunk high enough to just clear your shoulder blades. Lifting higher can put excessive stress on the lower back and does not further strengthen your abdominal muscles.  Control your return to the starting position. Repeat __________ times. Complete this exercise __________ times per day.  STRENGTHENING - Quadruped, Opposite UE/LE Lift   Assume a hands and knees position on a firm surface. Keep your hands under your shoulders and your knees under your hips. You may place padding under your knees for comfort.  Find your neutral spine and gently tense your abdominal muscles so that you can maintain this position. Your shoulders and hips should form a rectangle that is parallel with the floor and is not twisted.  Keeping your trunk steady, lift your right hand no higher than your shoulder and then your left leg no higher than your hip. Make sure you are not holding your breath. Hold this position for __________ seconds.  Continuing to keep  your abdominal muscles tense and your back steady, slowly return to your starting position. Repeat with the opposite arm and leg. Repeat __________ times. Complete this exercise __________ times per day.  STRENGTHENING - Abdominals and Quadriceps, Straight Leg Raise   Lie on a firm bed or floor with both legs extended in front of you.  Keeping one leg in contact with the floor, bend the other knee so that your foot can rest flat on the floor.  Find your neutral spine, and tense your abdominal muscles to maintain your spinal position throughout the exercise.  Slowly lift your straight leg off the floor about 6 inches for a count   of 15, making sure to not hold your breath.  Still keeping your neutral spine, slowly lower your leg all the way to the floor. Repeat this exercise with each leg __________ times. Complete this exercise __________ times per day. POSTURE AND BODY MECHANICS CONSIDERATIONS - Low Back Sprain Keeping correct posture when sitting, standing or completing your activities will reduce the stress put on different body tissues, allowing injured tissues a chance to heal and limiting painful experiences. The following are general guidelines for improved posture. Your physician or physical therapist will provide you with any instructions specific to your needs. While reading these guidelines, remember:  The exercises prescribed by your provider will help you have the flexibility and strength to maintain correct postures.  The correct posture provides the best environment for your joints to work. All of your joints have less wear and tear when properly supported by a spine with good posture. This means you will experience a healthier, less painful body.  Correct posture must be practiced with all of your activities, especially prolonged sitting and standing. Correct posture is as important when doing repetitive low-stress activities (typing) as it is when doing a single heavy-load  activity (lifting). RESTING POSITIONS Consider which positions are most painful for you when choosing a resting position. If you have pain with flexion-based activities (sitting, bending, stooping, squatting), choose a position that allows you to rest in a less flexed posture. You would want to avoid curling into a fetal position on your side. If your pain worsens with extension-based activities (prolonged standing, working overhead), avoid resting in an extended position such as sleeping on your stomach. Most people will find more comfort when they rest with their spine in a more neutral position, neither too rounded nor too arched. Lying on a non-sagging bed on your side with a pillow between your knees, or on your back with a pillow under your knees will often provide some relief. Keep in mind, being in any one position for a prolonged period of time, no matter how correct your posture, can still lead to stiffness. PROPER SITTING POSTURE In order to minimize stress and discomfort on your spine, you must sit with correct posture. Sitting with good posture should be effortless for a healthy body. Returning to good posture is a gradual process. Many people can work toward this most comfortably by using various supports until they have the flexibility and strength to maintain this posture on their own. When sitting with proper posture, your ears will fall over your shoulders and your shoulders will fall over your hips. You should use the back of the chair to support your upper back. Your lower back will be in a neutral position, just slightly arched. You may place a small pillow or folded towel at the base of your lower back for  support.  When working at a desk, create an environment that supports good, upright posture. Without extra support, muscles tire, which leads to excessive strain on joints and other tissues. Keep these recommendations in mind: CHAIR:  A chair should be able to slide under your desk  when your back makes contact with the back of the chair. This allows you to work closely.  The chair's height should allow your eyes to be level with the upper part of your monitor and your hands to be slightly lower than your elbows. BODY POSITION  Your feet should make contact with the floor. If this is not possible, use a foot rest.  Keep your   ears over your shoulders. This will reduce stress on your neck and low back. INCORRECT SITTING POSTURES  If you are feeling tired and unable to assume a healthy sitting posture, do not slouch or slump. This puts excessive strain on your back tissues, causing more damage and pain. Healthier options include:  Using more support, like a lumbar pillow.  Switching tasks to something that requires you to be upright or walking.  Talking a brief walk.  Lying down to rest in a neutral-spine position. PROLONGED STANDING WHILE SLIGHTLY LEANING FORWARD  When completing a task that requires you to lean forward while standing in one place for a long time, place either foot up on a stationary 2-4 inch high object to help maintain the best posture. When both feet are on the ground, the lower back tends to lose its slight inward curve. If this curve flattens (or becomes too large), then the back and your other joints will experience too much stress, tire more quickly, and can cause pain. CORRECT STANDING POSTURES Proper standing posture should be assumed with all daily activities, even if they only take a few moments, like when brushing your teeth. As in sitting, your ears should fall over your shoulders and your shoulders should fall over your hips. You should keep a slight tension in your abdominal muscles to brace your spine. Your tailbone should point down to the ground, not behind your body, resulting in an over-extended swayback posture.  INCORRECT STANDING POSTURES  Common incorrect standing postures include a forward head, locked knees and/or an excessive  swayback. WALKING Walk with an upright posture. Your ears, shoulders and hips should all line-up. PROLONGED ACTIVITY IN A FLEXED POSITION When completing a task that requires you to bend forward at your waist or lean over a low surface, try to find a way to stabilize 3 out of 4 of your limbs. You can place a hand or elbow on your thigh or rest a knee on the surface you are reaching across. This will provide you more stability, so that your muscles do not tire as quickly. By keeping your knees relaxed, or slightly bent, you will also reduce stress across your lower back. CORRECT LIFTING TECHNIQUES DO :  Assume a wide stance. This will provide you more stability and the opportunity to get as close as possible to the object which you are lifting.  Tense your abdominals to brace your spine. Bend at the knees and hips. Keeping your back locked in a neutral-spine position, lift using your leg muscles. Lift with your legs, keeping your back straight.  Test the weight of unknown objects before attempting to lift them.  Try to keep your elbows locked down at your sides in order get the best strength from your shoulders when carrying an object.  Always ask for help when lifting heavy or awkward objects. INCORRECT LIFTING TECHNIQUES DO NOT:   Lock your knees when lifting, even if it is a small object.  Bend and twist. Pivot at your feet or move your feet when needing to change directions.  Assume that you can safely pick up even a paperclip without proper posture. Document Released: 01/20/2005 Document Revised: 04/14/2011 Document Reviewed: 05/04/2008 [employer] Patient Information 2015 [employer]. This information is not intended to replace advice given to you by your health care provider. Make sure you discuss any questions you have with your health care provider.  

## 2014-07-18 NOTE — Progress Notes (Signed)
   Subjective:    Patient ID: Kayla Kelley, female    DOB: 05/17/1965, 49 y.o.   MRN: [id number]  HPI Patient has had some recent low back pain Onset- several weeks ago and intermittent Location- mid lower lumbar Radiation- occasional down RLE to foot No injury. Associated symptoms- occasional mild numbness bottom of right foot.  No weakness. No urine or stool incontinence.  Took Ibuprofen and Flexeril 5 mg which helped.  Pain is much improved today. Also has some occasional right knee pain which is also better today.  No swelling, warmth, or erythema.  No locking or giving way.  Past Medical History  Diagnosis Date  . Tachycardia   . Benign essential hypertension   . Hidradenitis suppurativa   . Narcolepsy without cataplexy   . Anemia   . Routine physical examination   . Muscle spasm of back   . Sciatica     acute  . Lumbosacral strain     acute  . GERD (gastroesophageal reflux disease)   . UTI (urinary tract infection)   . PMS (premenstrual syndrome)   . Sleep apnea   . Fatigue   . Overweight(278.02)   . Hypothyroidism    No past surgical history on file.  reports that she has never smoked. She has never used smokeless tobacco. She reports that she does not drink alcohol or use illicit drugs. family history includes Diabetes in her father, mother, paternal aunt, paternal grandfather, and paternal grandmother; Heart disease (age of onset: 57) in her mother. Allergies  Allergen Reactions  . Ampicillin     hives      Review of Systems  Constitutional: Negative for fever and appetite change.  Cardiovascular: Negative for chest pain.  Gastrointestinal: Negative for abdominal distention.  Genitourinary: Negative for dysuria.  Musculoskeletal: Positive for back pain.  Neurological: Positive for numbness. Negative for weakness.  Hematological: Negative for adenopathy. Does not bruise/bleed easily.       Objective:   Physical Exam  Constitutional: She appears  well-developed and well-nourished.  Cardiovascular: Normal rate and regular rhythm.   Pulmonary/Chest: Effort normal and breath sounds normal. No respiratory distress. She has no wheezes. She has no rales.  Musculoskeletal: She exhibits no edema.  Neurological: She is alert. She has normal reflexes.  Full strength lower extremities.  Symmetric reflexes. Normal sensory.          Assessment & Plan:  Low back pain with right ?radiculopathy symptoms- non-focal exam and pain much improved at this time.  Extension stretches given.  Refilled Flexeril for prn use.  Continue with Ibuprofen as needed.  Follow up prn.

## 2014-07-18 NOTE — Progress Notes (Signed)
Pre visit review using our clinic review tool, if applicable. No additional management support is needed unless otherwise documented below in the visit note. 

## 2014-08-30 ENCOUNTER — Ambulatory Visit (INDEPENDENT_AMBULATORY_CARE_PROVIDER_SITE_OTHER): Payer: 59 | Admitting: Family Medicine

## 2014-08-30 ENCOUNTER — Encounter: Payer: Self-pay | Admitting: Family Medicine

## 2014-08-30 VITALS — BP 124/80 | HR 86 | Temp 98.1°F | Wt 198.0 lb

## 2014-08-30 DIAGNOSIS — R300 Dysuria: Secondary | ICD-10-CM | POA: Diagnosis not present

## 2014-08-30 MED ORDER — CIPROFLOXACIN HCL 500 MG PO TABS: 500.0000 mg | ORAL_TABLET | Freq: Two times a day (BID) | ORAL | Status: DC

## 2014-08-30 NOTE — Progress Notes (Signed)
   Subjective:    Patient ID: Kayla Kelley, female    DOB: 02-07-1965, 49 y.o.   MRN: [id number]  HPI Patient seen with possible UTI. Increased suprapubic pressure and feeling that she has to urinate more frequently over the past few days. Took some Azo and symptoms are slightly improved today. She does not have any actual burning with urination. She does have some chills last night but no documented fever.  Past Medical History  Diagnosis Date  . Tachycardia   . Benign essential hypertension   . Hidradenitis suppurativa   . Narcolepsy without cataplexy   . Anemia   . Routine physical examination   . Muscle spasm of back   . Sciatica     acute  . Lumbosacral strain     acute  . GERD (gastroesophageal reflux disease)   . UTI (urinary tract infection)   . PMS (premenstrual syndrome)   . Sleep apnea   . Fatigue   . Overweight(278.02)   . Hypothyroidism    No past surgical history on file.  reports that she has never smoked. She has never used smokeless tobacco. She reports that she does not drink alcohol or use illicit drugs. family history includes Diabetes in her father, mother, paternal aunt, paternal grandfather, and paternal grandmother; Heart disease (age of onset: 25) in her mother. Allergies  Allergen Reactions  . Ampicillin     hives     Review of Systems  Constitutional: Positive for chills. Negative for fever.  Respiratory: Negative for shortness of breath.   Genitourinary: Positive for dysuria and frequency. Negative for hematuria and pelvic pain.       Objective:   Physical Exam  Constitutional: She appears well-developed and well-nourished.  Cardiovascular: Normal rate and regular rhythm.   Pulmonary/Chest: Effort normal and breath sounds normal. No respiratory distress. She has no wheezes. She has no rales.  Musculoskeletal:  No CVA tenderness          Assessment & Plan:  Dysuria and urine frequency. Dipstick unreliable with recent use of  Azo-Standard. Our urinalysis analyzer is down today. We decided to cover with Cipro 500 mg twice a day for 3 days.

## 2014-08-30 NOTE — Progress Notes (Signed)
Pre visit review using our clinic review tool, if applicable. No additional management support is needed unless otherwise documented below in the visit note. 

## 2014-08-30 NOTE — Patient Instructions (Signed)

## 2015-01-24 ENCOUNTER — Ambulatory Visit (INDEPENDENT_AMBULATORY_CARE_PROVIDER_SITE_OTHER): Payer: 59 | Admitting: Family Medicine

## 2015-01-24 ENCOUNTER — Encounter: Payer: Self-pay | Admitting: Family Medicine

## 2015-01-24 VITALS — BP 126/88 | HR 100 | Temp 99.1°F | Ht 64.0 in | Wt 205.3 lb

## 2015-01-24 DIAGNOSIS — R030 Elevated blood-pressure reading, without diagnosis of hypertension: Secondary | ICD-10-CM

## 2015-01-24 DIAGNOSIS — J069 Acute upper respiratory infection, unspecified: Secondary | ICD-10-CM | POA: Diagnosis not present

## 2015-01-24 DIAGNOSIS — IMO0001 Reserved for inherently not codable concepts without codable children: Secondary | ICD-10-CM

## 2015-01-24 MED ORDER — HYDROCODONE-HOMATROPINE 5-1.5 MG/5ML PO SYRP: 5.0000 mL | ORAL_SOLUTION | Freq: Three times a day (TID) | ORAL | Status: DC | PRN

## 2015-01-24 NOTE — Progress Notes (Signed)
HPI:  URI: -started: about 5 days ago -symptoms:nasal congestion, sore throat, cough, PND -denies:fever, SOB, NVD, tooth pain, sinus pain -has tried: delsym -sick contacts/travel/risks: denies flu exposure, tick exposure or or Ebola risks -had flu shot at work  ROS: See pertinent positives and negatives per HPI.  Past Medical History  Diagnosis Date  . Tachycardia   . Benign essential hypertension   . Hidradenitis suppurativa   . Narcolepsy without cataplexy   . Anemia   . Routine physical examination   . Muscle spasm of back   . Sciatica     acute  . Lumbosacral strain     acute  . GERD (gastroesophageal reflux disease)   . UTI (urinary tract infection)   . PMS (premenstrual syndrome)   . Sleep apnea   . Fatigue   . Overweight(278.02)   . Hypothyroidism     No past surgical history on file.  Family History  Problem Relation Age of Onset  . Heart disease Mother 51    CAD  . Diabetes Mother   . Diabetes Father   . Diabetes Paternal Aunt   . Diabetes Paternal Grandmother   . Diabetes Paternal Grandfather     Social History   Social History  . Marital Status: Married    Spouse Name: N/A  . Number of Children: N/A  . Years of Education: N/A   Social History Main Topics  . Smoking status: Never Smoker   . Smokeless tobacco: Never Used  . Alcohol Use: No  . Drug Use: No  . Sexual Activity: Yes   Other Topics Concern  . None   Social History Narrative     Current outpatient prescriptions:  .  HYDROcodone-homatropine (HYCODAN) 5-1.5 MG/5ML syrup, Take 5 mLs by mouth every 8 (eight) hours as needed for cough., Disp: 120 mL, Rfl: 0  EXAM:  Filed Vitals:   01/24/15 0940  BP: 126/88  Pulse: 100  Temp: 99.1 F (37.3 C)    Body mass index is 35.22 kg/(m^2).  GENERAL: vitals reviewed and listed above, alert, oriented, appears well hydrated and in no acute distress  HEENT: atraumatic, conjunttiva clear, no obvious abnormalities on inspection  of external nose and ears, normal appearance of ear canals and TMs, clear nasal congestion, mild post oropharyngeal erythema with PND, no tonsillar edema or exudate, no sinus TTP  NECK: no obvious masses on inspection  LUNGS: clear to auscultation bilaterally, no wheezes, rales or rhonchi, good air movement  CV: HRRR, no peripheral edema  MS: moves all extremities without noticeable abnormality  PSYCH: pleasant and cooperative, no obvious depression or anxiety  ASSESSMENT AND PLAN:  Discussed the following assessment and plan:  Acute upper respiratory infection  Elevated blood pressure  -given HPI and exam findings today, a serious infection or illness is unlikely. We discussed potential etiologies, with VURI being most likely, and advised supportive care and monitoring. We discussed treatment side effects, likely course, antibiotic misuse, transmission, and signs of developing a serious illness. -of course, we advised to return or notify a doctor immediately if symptoms worsen or persist or new concerns arise. BP high on arrival - improved on recheck, she report hx white coat HTN, agrees to monitor at work and follow up with PCP.   Patient Instructions  BEFORE YOU LEAVE: -schedule follow up with your doctor in 3 months to recheck blood pressure  We recommend the following healthy lifestyle measures: - eat a healthy whole foods diet consisting of regular small meals  composed of vegetables, fruits, beans, nuts, seeds, healthy meats such as white chicken and fish and whole grains.  - avoid sweets, white starchy foods, fried foods, fast food, processed foods, sodas, red meet and other fattening foods.  - get a least 150-300 minutes of aerobic exercise per week.   INSTRUCTIONS FOR UPPER RESPIRATORY INFECTION:  -plenty of rest and fluids  -nasal saline wash 2-3 times daily (use prepackaged nasal saline or bottled/distilled water if making your own)   -can use AFRIN nasal spray for  drainage and nasal congestion - but do NOT use longer then 3-4 days  -can use tylenol (in no history of liver disease) or ibuprofen (if no history of kidney disease, bowel bleeding or significant heart disease) as directed for aches and sorethroat  -in the winter time, using a humidifier at night is helpful (please follow cleaning instructions)  -if you are taking a cough medication - use only as directed, may also try a teaspoon of honey to coat the throat and throat lozenges. If given a cough medication with codeine or hydrocodone or other narcotic please be advised that this contains a strong and  potentially addicting medication. Please follow instructions carefully, take as little as possible and only use AS NEEDED for severe cough. Discuss potential side effects with your pharmacy. Please do not drive or operate machinery while taking these types of medications. Please do not take other sedating medications, drugs or alcohol while taking this medication without discussing with your doctor.  -for sore throat, salt water gargles can help  -follow up if you have fevers, facial pain, tooth pain, difficulty breathing or are worsening or symptoms persist longer then expected  Upper Respiratory Infection, Adult An upper respiratory infection (URI) is also known as the common cold. It is often caused by a type of germ (virus). Colds are easily spread (contagious). You can pass it to others by kissing, coughing, sneezing, or drinking out of the same glass. Usually, you get better in 1 to 3  weeks.  However, the cough can last for even longer. HOME CARE   Only take medicine as told by your doctor. Follow instructions provided above.  Drink enough water and fluids to keep your pee (urine) clear or pale yellow.  Get plenty of rest.  Return to work when your temperature is < 100 for 24 hours or as told by your doctor. You may use a face mask and wash your hands to stop your cold from spreading. GET  HELP RIGHT AWAY IF:   After the first few days, you feel you are getting worse.  You have questions about your medicine.  You have chills, shortness of breath, or red spit (mucus).  You have pain in the face for more then 1-2 days, especially when you bend forward.  You have a fever, puffy (swollen) neck, pain when you swallow, or white spots in the back of your throat.  You have a bad headache, ear pain, sinus pain, or chest pain.  You have a high-pitched whistling sound when you breathe in and out (wheezing).  You cough up blood.  You have sore muscles or a stiff neck. MAKE SURE YOU:   Understand these instructions.  Will watch your condition.  Will get help right away if you are not doing well or get worse. Document Released: 07/09/2007 Document Revised: 04/14/2011 Document Reviewed: 04/27/2013 [facility] Patient Information 2015 [place], [place]. This information is not intended to replace advice given to you by your health  care provider. Make sure you discuss any questions you have with your health care provider.      Colin Benton R.

## 2015-01-24 NOTE — Progress Notes (Signed)
Pre visit review using our clinic review tool, if applicable. No additional management support is needed unless otherwise documented below in the visit note. 

## 2015-01-24 NOTE — Patient Instructions (Signed)
BEFORE YOU LEAVE: -schedule follow up with your doctor in 3 months to recheck blood pressure  We recommend the following healthy lifestyle measures: - eat a healthy whole foods diet consisting of regular small meals composed of vegetables, fruits, beans, nuts, seeds, healthy meats such as white chicken and fish and whole grains.  - avoid sweets, white starchy foods, fried foods, fast food, processed foods, sodas, red meet and other fattening foods.  - get a least 150-300 minutes of aerobic exercise per week.   INSTRUCTIONS FOR UPPER RESPIRATORY INFECTION:  -plenty of rest and fluids  -nasal saline wash 2-3 times daily (use prepackaged nasal saline or bottled/distilled water if making your own)   -can use AFRIN nasal spray for drainage and nasal congestion - but do NOT use longer then 3-4 days  -can use tylenol (in no history of liver disease) or ibuprofen (if no history of kidney disease, bowel bleeding or significant heart disease) as directed for aches and sorethroat  -in the winter time, using a humidifier at night is helpful (please follow cleaning instructions)  -if you are taking a cough medication - use only as directed, may also try a teaspoon of honey to coat the throat and throat lozenges. If given a cough medication with codeine or hydrocodone or other narcotic please be advised that this contains a strong and  potentially addicting medication. Please follow instructions carefully, take as little as possible and only use AS NEEDED for severe cough. Discuss potential side effects with your pharmacy. Please do not drive or operate machinery while taking these types of medications. Please do not take other sedating medications, drugs or alcohol while taking this medication without discussing with your doctor.  -for sore throat, salt water gargles can help  -follow up if you have fevers, facial pain, tooth pain, difficulty breathing or are worsening or symptoms persist longer then  expected  Upper Respiratory Infection, Adult An upper respiratory infection (URI) is also known as the common cold. It is often caused by a type of germ (virus). Colds are easily spread (contagious). You can pass it to others by kissing, coughing, sneezing, or drinking out of the same glass. Usually, you get better in 1 to 3  weeks.  However, the cough can last for even longer. HOME CARE   Only take medicine as told by your doctor. Follow instructions provided above.  Drink enough water and fluids to keep your pee (urine) clear or pale yellow.  Get plenty of rest.  Return to work when your temperature is < 100 for 24 hours or as told by your doctor. You may use a face mask and wash your hands to stop your cold from spreading. GET HELP RIGHT AWAY IF:   After the first few days, you feel you are getting worse.  You have questions about your medicine.  You have chills, shortness of breath, or red spit (mucus).  You have pain in the face for more then 1-2 days, especially when you bend forward.  You have a fever, puffy (swollen) neck, pain when you swallow, or white spots in the back of your throat.  You have a bad headache, ear pain, sinus pain, or chest pain.  You have a high-pitched whistling sound when you breathe in and out (wheezing).  You cough up blood.  You have sore muscles or a stiff neck. MAKE SURE YOU:   Understand these instructions.  Will watch your condition.  Will get help right away if you are  not doing well or get worse. Document Released: 07/09/2007 Document Revised: 04/14/2011 Document Reviewed: 04/27/2013 [facility] Patient Information 2015 [place], [place]. This information is not intended to replace advice given to you by your health care provider. Make sure you discuss any questions you have with your health care provider.

## 2015-06-06 ENCOUNTER — Other Ambulatory Visit (INDEPENDENT_AMBULATORY_CARE_PROVIDER_SITE_OTHER): Payer: 59

## 2015-06-06 DIAGNOSIS — Z0000 Encounter for general adult medical examination without abnormal findings: Secondary | ICD-10-CM

## 2015-06-06 LAB — BASIC METABOLIC PANEL
BUN: 16 mg/dL (ref 6–23)
CHLORIDE: 105 meq/L (ref 96–112)
CO2: 27 meq/L (ref 19–32)
Calcium: 9 mg/dL (ref 8.4–10.5)
Creatinine, Ser: 0.71 mg/dL (ref 0.40–1.20)
GFR: 92.85 mL/min (ref >60.00)
Glucose, Bld: 88 mg/dL (ref 70–99)
Potassium: 4.1 mEq/L (ref 3.5–5.1)
SODIUM: 139 meq/L (ref 135–145)

## 2015-06-06 LAB — LIPID PANEL
CHOL/HDL RATIO: 3
Cholesterol: 158 mg/dL (ref 0–200)
HDL: 50 mg/dL (ref >39.00)
LDL CALC: 90 mg/dL (ref 0–99)
NonHDL: 108.45
TRIGLYCERIDES: 92 mg/dL (ref 0.0–149.0)
VLDL: 18.4 mg/dL (ref 0.0–40.0)

## 2015-06-06 LAB — HEPATIC FUNCTION PANEL
ALBUMIN: 4 g/dL (ref 3.5–5.2)
ALK PHOS: 69 U/L (ref 39–117)
ALT: 12 U/L (ref 0–35)
AST: 12 U/L (ref 0–37)
Bilirubin, Direct: 0 mg/dL (ref 0.0–0.3)
TOTAL PROTEIN: 6.9 g/dL (ref 6.0–8.3)
Total Bilirubin: 0.4 mg/dL (ref 0.2–1.2)

## 2015-06-06 LAB — CBC WITH DIFFERENTIAL/PLATELET
BASOS ABS: 0.1 10*3/uL (ref 0.0–0.1)
BASOS PCT: 1 % (ref 0.0–3.0)
Eosinophils Absolute: 0.8 10*3/uL — ABNORMAL HIGH (ref 0.0–0.7)
Eosinophils Relative: 9.3 % — ABNORMAL HIGH (ref 0.0–5.0)
HEMATOCRIT: 34.6 % — AB (ref 36.0–46.0)
HEMOGLOBIN: 11.6 g/dL — AB (ref 12.0–15.0)
LYMPHS PCT: 28.2 % (ref 12.0–46.0)
Lymphs Abs: 2.4 10*3/uL (ref 0.7–4.0)
MCHC: 33.4 g/dL (ref 30.0–36.0)
MCV: 86.3 fl (ref 78.0–100.0)
MONOS PCT: 7.2 % (ref 3.0–12.0)
Monocytes Absolute: 0.6 10*3/uL (ref 0.1–1.0)
NEUTROS ABS: 4.7 10*3/uL (ref 1.4–7.7)
Neutrophils Relative %: 54.3 % (ref 43.0–77.0)
PLATELETS: 328 10*3/uL (ref 150.0–400.0)
RBC: 4.01 Mil/uL (ref 3.87–5.11)
RDW: 14.4 % (ref 11.5–15.5)
WBC: 8.6 10*3/uL (ref 4.0–10.5)

## 2015-06-06 LAB — TSH: TSH: 8.2 u[IU]/mL — ABNORMAL HIGH (ref 0.35–4.50)

## 2015-06-13 ENCOUNTER — Ambulatory Visit (INDEPENDENT_AMBULATORY_CARE_PROVIDER_SITE_OTHER): Payer: 59 | Admitting: Family Medicine

## 2015-06-13 VITALS — BP 140/88 | HR 118 | Temp 98.1°F | Ht 64.0 in | Wt 206.0 lb

## 2015-06-13 DIAGNOSIS — E039 Hypothyroidism, unspecified: Secondary | ICD-10-CM

## 2015-06-13 DIAGNOSIS — M5416 Radiculopathy, lumbar region: Secondary | ICD-10-CM | POA: Diagnosis not present

## 2015-06-13 DIAGNOSIS — Z0000 Encounter for general adult medical examination without abnormal findings: Secondary | ICD-10-CM | POA: Diagnosis not present

## 2015-06-13 MED ORDER — LEVOTHYROXINE SODIUM 50 MCG PO TABS: 50.0000 ug | ORAL_TABLET | Freq: Every day | ORAL | Status: DC

## 2015-06-13 MED ORDER — PREDNISONE 10 MG PO TABS: ORAL_TABLET | ORAL | Status: DC

## 2015-06-13 MED FILL — LEVOTHYROXINE 50 MCG TABLET: 50 | 90 days supply | Qty: 90 | Fill #0

## 2015-06-13 MED FILL — predniSONE 10 MG TABS: 10 | 9 days supply | Qty: 24 | Fill #0

## 2015-06-13 NOTE — Progress Notes (Signed)
Pre visit review using our clinic review tool, if applicable. No additional management support is needed unless otherwise documented below in the visit note. 

## 2015-06-13 NOTE — Progress Notes (Signed)
Subjective:    Patient ID: Kayla Kelley, female    DOB: 1966/01/15, 50 y.o.   MRN: [id number]  HPI   Patient here for complete physical.  She has not had a Pap smear in a couple years and has never had a mammogram. She plans to schedule mammogram soon and plans to set up follow-up with GYN soon.  She will turn 50 this December.  Has never smoked. No consistent exercise. Recent weight gain.  Takes no regular medications.  Previously took thyroid medication but took herself off few years ago.  Has had some recent progressive fatigue and weight gain.   Patient also complains of several months of pain in her left lower lumbar region which radiates occasionally to the ankle. Moderate severity at times. Occasional numbness around the foot and ankle region. No definite weakness. No loss of urine or bowel control.  Past Medical History  Diagnosis Date  . Tachycardia   . Benign essential hypertension   . Hidradenitis suppurativa   . Narcolepsy without cataplexy   . Anemia   . Routine physical examination   . Muscle spasm of back   . Sciatica     acute  . Lumbosacral strain     acute  . GERD (gastroesophageal reflux disease)   . UTI (urinary tract infection)   . PMS (premenstrual syndrome)   . Sleep apnea   . Fatigue   . Overweight(278.02)   . Hypothyroidism    No past surgical history on file.  reports that she has never smoked. She has never used smokeless tobacco. She reports that she does not drink alcohol or use illicit drugs. family history includes Diabetes in her father, mother, paternal aunt, paternal grandfather, and paternal grandmother; Heart disease (age of onset: 74) in her mother. Allergies  Allergen Reactions  . Ampicillin     hives      Review of Systems  Constitutional: Positive for fatigue and unexpected weight change. Negative for fever, activity change and appetite change.  HENT: Negative for ear pain, hearing loss, sore throat and trouble  swallowing.   Eyes: Negative for visual disturbance.  Respiratory: Negative for cough and shortness of breath.   Cardiovascular: Negative for chest pain and palpitations.  Gastrointestinal: Negative for abdominal pain, diarrhea, constipation and blood in stool.  Genitourinary: Negative for dysuria and hematuria.  Musculoskeletal: Positive for back pain. Negative for myalgias and arthralgias.  Skin: Negative for rash.  Neurological: Negative for dizziness, syncope, weakness and headaches. Numbness: see HPI.  Hematological: Negative for adenopathy.  Psychiatric/Behavioral: Negative for confusion and dysphoric mood.       Objective:   Physical Exam  Constitutional: She is oriented to person, place, and time. She appears well-developed and well-nourished.  HENT:  Head: Normocephalic and atraumatic.  Eyes: EOM are normal. Pupils are equal, round, and reactive to light.  Neck: Normal range of motion. Neck supple. No thyromegaly present.  Cardiovascular: Normal rate, regular rhythm and normal heart sounds.   No murmur heard. Pulmonary/Chest: Breath sounds normal. No respiratory distress. She has no wheezes. She has no rales.  Abdominal: Soft. Bowel sounds are normal. She exhibits no distension and no mass. There is no tenderness. There is no rebound and no guarding.  Musculoskeletal: Normal range of motion. She exhibits no edema.  Lymphadenopathy:    She has no cervical adenopathy.  Neurological: She is alert and oriented to person, place, and time. She displays normal reflexes. No cranial nerve deficit.  No lower extremity  strength deficits. She has slightly diminished left ankle reflex compared to the right with symmetric knee reflexes.  Skin: No rash noted.  Psychiatric: She has a normal mood and affect. Her behavior is normal. Judgment and thought content normal.          Assessment & Plan:   #1 health maintenance. Borderline elevated blood pressure. We recommended close  monitoring and lose some weight and reassess in about 3-4 months. She will need screening colonoscopy after she turns 50 in December. Set up screening mammogram. Patient will set up follow-up for repeat Pap smear. Immunizations up-to-date.   Also discuss the following:   #2 elevated TSH of 8.20. Start back levothyroxine 50 g once daily. Recheck TSH in 3 months   #3 left lumbar radiculitis symptoms. Slightly diminished left ankle reflex but no strength deficits. Try prednisone taper over the next 8 days. May need MRI to further assess.  Touch base in two weeks if no better.  Eulas Post MD [facility]

## 2015-06-13 NOTE — Patient Instructions (Addendum)
DASH Eating Plan DASH stands for "Dietary Approaches to Stop Hypertension." The DASH eating plan is a healthy eating plan that has been shown to reduce high blood pressure (hypertension). Additional health benefits may include reducing the risk of type 2 diabetes mellitus, heart disease, and stroke. The DASH eating plan may also help with weight loss. WHAT DO I NEED TO KNOW ABOUT THE DASH EATING PLAN? For the DASH eating plan, you will follow these general guidelines:  Choose foods with a percent daily value for sodium of less than 5% (as listed on the food label).  Use salt-free seasonings or herbs instead of table salt or sea salt.  Check with your health care provider or pharmacist before using salt substitutes.  Eat lower-sodium products, often labeled as "lower sodium" or "no salt added."  Eat fresh foods.  Eat more vegetables, fruits, and low-fat dairy products.  Choose whole grains. Look for the word "whole" as the first word in the ingredient list.  Choose fish and skinless chicken or turkey more often than red meat. Limit fish, poultry, and meat to 6 oz (170 g) each day.  Limit sweets, desserts, sugars, and sugary drinks.  Choose heart-healthy fats.  Limit cheese to 1 oz (28 g) per day.  Eat more home-cooked food and less restaurant, buffet, and fast food.  Limit fried foods.  Cook foods using methods other than frying.  Limit canned vegetables. If you do use them, rinse them well to decrease the sodium.  When eating at a restaurant, ask that your food be prepared with less salt, or no salt if possible. WHAT FOODS CAN I EAT? Seek help from a dietitian for individual calorie needs. Grains Whole grain or whole wheat bread. Brown rice. Whole grain or whole wheat pasta. Quinoa, bulgur, and whole grain cereals. Low-sodium cereals. Corn or whole wheat flour tortillas. Whole grain cornbread. Whole grain crackers. Low-sodium crackers. Vegetables Fresh or frozen vegetables  (raw, steamed, roasted, or grilled). Low-sodium or reduced-sodium tomato and vegetable juices. Low-sodium or reduced-sodium tomato sauce and paste. Low-sodium or reduced-sodium canned vegetables.  Fruits All fresh, canned (in natural juice), or frozen fruits. Meat and Other Protein Products Ground beef (85% or leaner), grass-fed beef, or beef trimmed of fat. Skinless chicken or turkey. Ground chicken or turkey. Pork trimmed of fat. All fish and seafood. Eggs. Dried beans, peas, or lentils. Unsalted nuts and seeds. Unsalted canned beans. Dairy Low-fat dairy products, such as skim or 1% milk, 2% or reduced-fat cheeses, low-fat ricotta or cottage cheese, or plain low-fat yogurt. Low-sodium or reduced-sodium cheeses. Fats and Oils Tub margarines without trans fats. Light or reduced-fat mayonnaise and salad dressings (reduced sodium). Avocado. Safflower, olive, or canola oils. Natural peanut or almond butter. Other Unsalted popcorn and pretzels. The items listed above may not be a complete list of recommended foods or beverages. Contact your dietitian for more options. WHAT FOODS ARE NOT RECOMMENDED? Grains White bread. White pasta. White rice. Refined cornbread. Bagels and croissants. Crackers that contain trans fat. Vegetables Creamed or fried vegetables. Vegetables in a cheese sauce. Regular canned vegetables. Regular canned tomato sauce and paste. Regular tomato and vegetable juices. Fruits Dried fruits. Canned fruit in light or heavy syrup. Fruit juice. Meat and Other Protein Products Fatty cuts of meat. Ribs, chicken wings, bacon, sausage, bologna, salami, chitterlings, fatback, hot dogs, bratwurst, and packaged luncheon meats. Salted nuts and seeds. Canned beans with salt. Dairy Whole or 2% milk, cream, half-and-half, and cream cheese. Whole-fat or sweetened yogurt. Full-fat   cheeses or blue cheese. Nondairy creamers and whipped toppings. Processed cheese, cheese spreads, or cheese  curds. Condiments Onion and garlic salt, seasoned salt, table salt, and sea salt. Canned and packaged gravies. Worcestershire sauce. Tartar sauce. Barbecue sauce. Teriyaki sauce. Soy sauce, including reduced sodium. Steak sauce. Fish sauce. Oyster sauce. Cocktail sauce. Horseradish. Ketchup and mustard. Meat flavorings and tenderizers. Bouillon cubes. Hot sauce. Tabasco sauce. Marinades. Taco seasonings. Relishes. Fats and Oils Butter, stick margarine, lard, shortening, ghee, and bacon fat. Coconut, palm kernel, or palm oils. Regular salad dressings. Other Pickles and olives. Salted popcorn and pretzels. The items listed above may not be a complete list of foods and beverages to avoid. Contact your dietitian for more information. WHERE CAN I FIND MORE INFORMATION? National Heart, Lung, and Blood Institute: travelstabloid.com   This information is not intended to replace advice given to you by your health care provider. Make sure you discuss any questions you have with your health care provider.   Document Released: 01/09/2011 Document Revised: 02/10/2014 Document Reviewed: 11/24/2012 Elsevier Interactive Patient Education 2016 Ruth up repeat mammogram and pap smear this year. Consider repeat colonoscopy  Remember to get repeat thyroid check in 3 months. Try to lose some weight and monitor blood pressure and be in touch if consistently > 140/90

## 2015-06-20 ENCOUNTER — Encounter: Payer: 59 | Admitting: Family Medicine

## 2015-06-20 ENCOUNTER — Other Ambulatory Visit: Payer: Self-pay

## 2015-06-20 ENCOUNTER — Encounter: Payer: Self-pay | Admitting: Family Medicine

## 2015-06-20 MED ORDER — PREDNISONE 10 MG PO TABS: ORAL_TABLET | ORAL | Status: DC

## 2015-06-25 MED FILL — predniSONE 10 MG TABS: 10 | 9 days supply | Qty: 24 | Fill #0

## 2015-08-08 ENCOUNTER — Ambulatory Visit (INDEPENDENT_AMBULATORY_CARE_PROVIDER_SITE_OTHER): Payer: 59 | Admitting: Family Medicine

## 2015-08-08 ENCOUNTER — Encounter: Payer: Self-pay | Admitting: Family Medicine

## 2015-08-08 VITALS — BP 130/90 | HR 100 | Temp 98.5°F | Ht 64.0 in | Wt 210.7 lb

## 2015-08-08 DIAGNOSIS — R198 Other specified symptoms and signs involving the digestive system and abdomen: Secondary | ICD-10-CM

## 2015-08-08 DIAGNOSIS — R1900 Intra-abdominal and pelvic swelling, mass and lump, unspecified site: Secondary | ICD-10-CM

## 2015-08-08 DIAGNOSIS — M25561 Pain in right knee: Secondary | ICD-10-CM

## 2015-08-08 NOTE — Progress Notes (Signed)
Pre visit review using our clinic review tool, if applicable. No additional management support is needed unless otherwise documented below in the visit note. 

## 2015-08-08 NOTE — Progress Notes (Addendum)
Subjective:    Patient ID: Kayla Kelley, female    DOB: 1965/08/26, 50 y.o.   MRN: [id number]  HPI Patient seen for the following issues which are both new issues that have not then addressed previously  Bilateral knee pain. Right greater than left. She complains mostly of early morning stiffness but does have some associated pain. Denies any more generalized arthralgias. She's not seen any definite effusion. No warmth. No erythema. Ibuprofen helps. No associated weakness. Symptoms tend to improve as she gets up and moves around during the day. Denies any prior x-rays. No locking or giving way. Severity of symptoms is moderate  Patient complains of some abdominal fullness noted particularly over the past couple months. She had physical in May and no mention of any abdominal mass at that point. She's had frustrations with inability to lose weight. Her appetite is normal. She states her bowel movements are normal she denies any constipation or bloody stools. Recent labs fairly unremarkable with the exception of elevated TSH. She is now back on thyroid replacement. She has not had any associated abdominal pain No nausea or vomiting. No dysuria.  Past Medical History  Diagnosis Date  . Tachycardia   . Benign essential hypertension   . Hidradenitis suppurativa   . Narcolepsy without cataplexy   . Anemia   . Routine physical examination   . Muscle spasm of back   . Sciatica     acute  . Lumbosacral strain     acute  . GERD (gastroesophageal reflux disease)   . UTI (urinary tract infection)   . PMS (premenstrual syndrome)   . Sleep apnea   . Fatigue   . Overweight(278.02)   . Hypothyroidism    No past surgical history on file.  reports that she has never smoked. She has never used smokeless tobacco. She reports that she does not drink alcohol or use illicit drugs. family history includes Diabetes in her father, mother, paternal aunt, paternal grandfather, and paternal  grandmother; Heart disease (age of onset: 68) in her mother. Allergies  Allergen Reactions  . Ampicillin     hives       Review of Systems  Respiratory: Negative for shortness of breath.   Cardiovascular: Negative for chest pain.  Gastrointestinal: Negative for nausea, vomiting, abdominal pain, diarrhea, constipation and blood in stool.  Musculoskeletal: Positive for arthralgias (See history of present illness). Negative for myalgias and joint swelling.  Skin: Negative for rash.  Neurological: Negative for dizziness and weakness.  Hematological: Negative for adenopathy. Does not bruise/bleed easily.       Objective:   Physical Exam  Constitutional: She appears well-developed and well-nourished.  Cardiovascular: Normal rate and regular rhythm.   Pulmonary/Chest: Effort normal and breath sounds normal. No respiratory distress. She has no wheezes. She has no rales.  Abdominal: Bowel sounds are normal. There is no tenderness. There is no guarding.  No palpable hepatomegaly or splenomegaly. She does have prominent fullness and questionable mass involving right and left mid abdomen-very firm to palpation but no guarding and nontender  Musculoskeletal: She exhibits no edema.  Knees revealed no effusion. No warmth. No erythema. Mild medial joint line tenderness. No crepitus. Full range of motion. Ligament testing is normal. No popliteal swelling          Assessment & Plan:  # 1 bilateral knee pain right greater than left. Possible early osteoarthritis. No evidence clinically for likely inflammatory arthritis. Obtain x-ray right knee to further assess. We'll encourage  weight loss. We discussed good strengthening exercise for quadriceps. She is looking at getting exercise bike .  Caution with regular use of ibuprofen.  #2 diffuse abdominal fullness mid abdomen as above. Doubt this is related to increased stool she denies any abnormalities whatsoever with bowel movements and having  regular bowel movements with no constipation. She does not have any red flags such as appetite or weight changes. Set up complete abdominal ultrasound to further assess  Eulas Post MD [facility]   Patient has large mass abdomen/ pelvis with both solid and cystic components concerning for possible ovarian malignancy. CT abdomen pelvis with contrast. Patient is aware of results. Will also need to proceed with GYN oncology referral She's not having any fever and no change in appetite or any weight loss. No associated pain.  Eulas Post MD [facility]  Patient has been notified of CT results.  Complex cystic pelvic mass associated with right ovary along with right-sided hepatic cyst and mass effect on ureters and distal IVC and proximal common iliac veins.   I am in process of getting in touch with gyn-onc and will get her in to see them as soon as possible.  I am concerned regarding IVC compression and risk of DVT.  She has no LE pain, chest pain, or dyspnea at this time.  Will probably need to get venous dopplers but will discuss with gyn-oncology first.  Eulas Post MD [facility]

## 2015-08-08 NOTE — Patient Instructions (Signed)
We will set up abdominal ultrasound to further assess.   

## 2015-08-21 ENCOUNTER — Ambulatory Visit
Admission: RE | Admit: 2015-08-21 | Discharge: 2015-08-21 | Disposition: A | Discharge status: Home / Self Care | Payer: 59 | Source: Ambulatory Visit | Attending: Family Medicine | Admitting: Family Medicine

## 2015-08-21 DIAGNOSIS — R198 Other specified symptoms and signs involving the digestive system and abdomen: Secondary | ICD-10-CM

## 2015-08-21 DIAGNOSIS — K7689 Other specified diseases of liver: Secondary | ICD-10-CM | POA: Diagnosis not present

## 2015-08-22 ENCOUNTER — Ambulatory Visit (INDEPENDENT_AMBULATORY_CARE_PROVIDER_SITE_OTHER)
Admission: RE | Admit: 2015-08-22 | Discharge: 2015-08-22 | Disposition: A | Discharge status: Home / Self Care | Payer: 59 | Source: Ambulatory Visit | Attending: Family Medicine | Admitting: Family Medicine

## 2015-08-22 DIAGNOSIS — R1900 Intra-abdominal and pelvic swelling, mass and lump, unspecified site: Secondary | ICD-10-CM | POA: Diagnosis not present

## 2015-08-22 DIAGNOSIS — K7689 Other specified diseases of liver: Secondary | ICD-10-CM | POA: Diagnosis not present

## 2015-08-22 MED ORDER — IOPAMIDOL (ISOVUE-300) INJECTION 61%
100.0000 mL | Freq: Once | INTRAVENOUS | Status: AC | PRN
  Administered 2015-08-22: 100 mL via INTRAVENOUS

## 2015-08-22 NOTE — Addendum Note (Signed)
Addended by: Eulas Post on: 08/22/2015 07:13 AM   Modules accepted: Orders, SmartSet

## 2015-08-23 ENCOUNTER — Other Ambulatory Visit: Payer: Self-pay | Admitting: Family Medicine

## 2015-08-23 ENCOUNTER — Telehealth: Payer: Self-pay | Admitting: Family Medicine

## 2015-08-23 ENCOUNTER — Other Ambulatory Visit: Payer: Self-pay | Admitting: *Deleted

## 2015-08-23 DIAGNOSIS — R600 Localized edema: Secondary | ICD-10-CM

## 2015-08-23 DIAGNOSIS — M7989 Other specified soft tissue disorders: Secondary | ICD-10-CM

## 2015-08-23 NOTE — Addendum Note (Signed)
Addended by: Eulas Post on: 08/23/2015 09:28 AM   Modules accepted: Miquel Dunn

## 2015-08-23 NOTE — Telephone Encounter (Signed)
[facility] / [facility] did not have any openings today for pt to have her  LE VENOUS today  . Pt is scheduled for 08-24-2015@3 :00 pm@ [facility]  pt is aware is this ok please advise

## 2015-08-24 ENCOUNTER — Ambulatory Visit (HOSPITAL_COMMUNITY)
Admission: RE | Admit: 2015-08-24 | Discharge: 2015-08-24 | Disposition: A | Discharge status: Home / Self Care | Payer: 59 | Source: Ambulatory Visit | Attending: Cardiology | Admitting: Cardiology

## 2015-08-24 ENCOUNTER — Other Ambulatory Visit (HOSPITAL_COMMUNITY)
Admission: RE | Admit: 2015-08-24 | Discharge: 2015-08-24 | Disposition: A | Discharge status: Home / Self Care | Payer: 59 | Source: Ambulatory Visit | Attending: Gynecology | Admitting: Gynecology

## 2015-08-24 ENCOUNTER — Ambulatory Visit: Payer: 59 | Attending: Gynecology | Admitting: Gynecology

## 2015-08-24 ENCOUNTER — Encounter: Payer: Self-pay | Admitting: Gynecology

## 2015-08-24 VITALS — BP 151/100 | HR 118 | Resp 18 | Ht 64.0 in | Wt 206.6 lb

## 2015-08-24 DIAGNOSIS — Z01411 Encounter for gynecological examination (general) (routine) with abnormal findings: Secondary | ICD-10-CM | POA: Insufficient documentation

## 2015-08-24 DIAGNOSIS — Z8249 Family history of ischemic heart disease and other diseases of the circulatory system: Secondary | ICD-10-CM | POA: Insufficient documentation

## 2015-08-24 DIAGNOSIS — I10 Essential (primary) hypertension: Secondary | ICD-10-CM | POA: Diagnosis not present

## 2015-08-24 DIAGNOSIS — R000 Tachycardia, unspecified: Secondary | ICD-10-CM | POA: Insufficient documentation

## 2015-08-24 DIAGNOSIS — N943 Premenstrual tension syndrome: Secondary | ICD-10-CM | POA: Insufficient documentation

## 2015-08-24 DIAGNOSIS — R1900 Intra-abdominal and pelvic swelling, mass and lump, unspecified site: Secondary | ICD-10-CM | POA: Insufficient documentation

## 2015-08-24 DIAGNOSIS — Z87440 Personal history of urinary (tract) infections: Secondary | ICD-10-CM | POA: Diagnosis not present

## 2015-08-24 DIAGNOSIS — G4730 Sleep apnea, unspecified: Secondary | ICD-10-CM | POA: Insufficient documentation

## 2015-08-24 DIAGNOSIS — K7689 Other specified diseases of liver: Secondary | ICD-10-CM | POA: Diagnosis not present

## 2015-08-24 DIAGNOSIS — K219 Gastro-esophageal reflux disease without esophagitis: Secondary | ICD-10-CM | POA: Insufficient documentation

## 2015-08-24 DIAGNOSIS — Z880 Allergy status to penicillin: Secondary | ICD-10-CM | POA: Insufficient documentation

## 2015-08-24 DIAGNOSIS — M5430 Sciatica, unspecified side: Secondary | ICD-10-CM | POA: Insufficient documentation

## 2015-08-24 DIAGNOSIS — G47419 Narcolepsy without cataplexy: Secondary | ICD-10-CM | POA: Insufficient documentation

## 2015-08-24 DIAGNOSIS — N838 Other noninflammatory disorders of ovary, fallopian tube and broad ligament: Secondary | ICD-10-CM

## 2015-08-24 DIAGNOSIS — M7989 Other specified soft tissue disorders: Secondary | ICD-10-CM | POA: Diagnosis not present

## 2015-08-24 DIAGNOSIS — D649 Anemia, unspecified: Secondary | ICD-10-CM | POA: Diagnosis not present

## 2015-08-24 DIAGNOSIS — E039 Hypothyroidism, unspecified: Secondary | ICD-10-CM | POA: Insufficient documentation

## 2015-08-24 DIAGNOSIS — L732 Hidradenitis suppurativa: Secondary | ICD-10-CM | POA: Diagnosis not present

## 2015-08-24 NOTE — Patient Instructions (Addendum)
Plan for surgery on August 8 with Dr. Fermin Schwab at [facility].  You will also be given a pre-operative appointment.  When you go to [facility] for your pre-op appt, you will need to check in at the [facility].  Please call for any questions or concerns.

## 2015-08-24 NOTE — Progress Notes (Signed)
Consult Note: Gyn-Onc   Kayla Kelley 50 y.o. female  No chief complaint on file.   Assessment : A 20 cm complex abdominal pelvic mass. Apparent benign liver cyst.  Plan: I recommend exploratory laparotomy and resection of the mass with conquer current intraoperative frozen section. If this is benign the patient wishes to have an abdominal hysterectomy and bilateral salpingo-oophorectomy. On the other hand, if this is a malignancy, we will proceed with surgical staging. The patient is aware that there is some compression of the vena cava and, iliac veins and is scheduled to have ultrasound of the venous system of the legs later today. Certainly we'll use perioperative venous thrombi most prophylaxis. Risks of surgery including hemorrhage, infection, injury to adjacent viscera, anus thrombolic complications, and anesthetic risks were reviewed with the patient and her husband. Their questions are answered. A Pap smear was obtained today as the patient has not had one in number of years.  The patient wishes to proceed with surgery under my direction and we will schedule it at [facility] for 09/11/2015.   Dr. Frederich Cha was consulted regarding the liver cyst. Her CT scan is reviewed. He indicates that it appears to be benign and given the patient is asymptomatic would not recommend any further intervention.     HPI: 50 year old white married female gravida 2 para 2 seen in consultation request of Dr.Bruce Burchette regarding management of a newly diagnosed large abdominal pelvic mass. Patient presented with several months history of increasing abdominal girth which she attributed to weight gain. However on examination found to have a mass there is been further evaluated with an ultrasound and CT scan. CT scan shows a 18.4 x 15.3 x 19.4 cm abdominopelvic mass with a well-defined margin and mostly cystic features. However there are some areas of solid characters. The left ovary appears normal as does the  uterus. There is some venous compression noted. There are multiple retroperitoneal lymph nodes slightly enlarged along the aortic bifurcation and iliac veins (these are borderline enlarged) in addition there is a 9 cm right sided hepatic cyst.  The patient has no past gynecologic history. She has no family history of breast or ovarian cancer. Interestingly, the patient reports that she underwent menopause at age 2. She is not on any hormone replacement therapy.  Review of Systems:10 point review of systems is negative except as noted in interval history.   Vitals: Blood pressure 151/100, pulse 118, resp. rate 18, height 5\' 4"  (1.626 m), weight 206 lb 9.6 oz (93.713 kg), last menstrual period 05/05/2010, SpO2 97 %.  Physical Exam: General : The patient is a healthy woman in no acute distress.  HEENT: normocephalic, extraoccular movements normal; neck is supple without thyromegally  Lynphnodes: Supraclavicular and inguinal nodes not enlarged  Abdomen: There is a palpable mass that extends nearly to the patient's xiphoid. This is nontender. I am unable to detect a fluid wave or shifting dullness. Pelvic:  EGBUS: Normal female  Vagina: Normal, no lesions  Urethra and Bladder: Normal, non-tender  Cervix: Normal but atrophic Uterus: Unable to evaluate due to the large mass. Bi-manual examination: The mass does not extend deep into the pelvis and is mostly abdominal on palpation. Rectal: normal sphincter tone, no masses, no blood  Lower extremities: No edema or varicosities. Normal range of motion      Allergies  Allergen Reactions  . Ampicillin     hives    Past Medical History  Diagnosis Date  . Tachycardia   .  Benign essential hypertension   . Hidradenitis suppurativa   . Narcolepsy without cataplexy   . Anemia   . Routine physical examination   . Muscle spasm of back   . Sciatica     acute  . Lumbosacral strain     acute  . GERD (gastroesophageal reflux disease)   . UTI  (urinary tract infection)   . PMS (premenstrual syndrome)   . Sleep apnea   . Fatigue   . Overweight(278.02)   . Hypothyroidism     History reviewed. No pertinent past surgical history.  Current Outpatient Prescriptions  Medication Sig Dispense Refill  . ALPRAZolam (XANAX) 0.25 MG tablet Take 0.25 mg by mouth 2 (two) times daily as needed for anxiety.    Marland Kitchen levothyroxine (SYNTHROID, LEVOTHROID) 50 MCG tablet Take 1 tablet (50 mcg total) by mouth daily. 90 tablet 3   No current facility-administered medications for this visit.    Social History   Social History  . Marital Status: Married    Spouse Name: N/A  . Number of Children: N/A  . Years of Education: N/A   Occupational History  . Not on file.   Social History Main Topics  . Smoking status: Never Smoker   . Smokeless tobacco: Never Used  . Alcohol Use: No  . Drug Use: No  . Sexual Activity: Yes   Other Topics Concern  . Not on file   Social History Narrative    Family History  Problem Relation Age of Onset  . Heart disease Mother 69    CAD  . Diabetes Mother   . Diabetes Father   . Diabetes Paternal Aunt   . Diabetes Paternal Grandmother   . Diabetes Paternal Grandfather       Marti Sleigh, MD 08/24/2015, 8:50 AM

## 2015-08-24 NOTE — Addendum Note (Signed)
Addended by: Elizebeth Koller on: 08/24/2015 11:55 AM   Modules accepted: Orders

## 2015-08-28 ENCOUNTER — Other Ambulatory Visit: Payer: Self-pay

## 2015-08-28 LAB — CYTOLOGY - PAP

## 2015-08-28 MED ORDER — ALPRAZOLAM 0.25 MG PO TABS
0.2500 mg | ORAL_TABLET | Freq: Two times a day (BID) | ORAL | 0 refills | Status: DC | PRN
Start: 2015-08-28 — End: 2016-07-22

## 2015-08-30 ENCOUNTER — Telehealth: Payer: Self-pay

## 2015-08-30 NOTE — Telephone Encounter (Signed)
Orders received from [facility] to contact the patient to update with PAP results being "normal" collected on 08/24/2015. Patient contacted and updated with PAP results . Patient states understanding and thanked our staff for being so professional and on top of things. Writer thanked Ms Finton and the call was ended.

## 2015-09-05 ENCOUNTER — Encounter: Payer: Self-pay | Admitting: Family Medicine

## 2015-09-06 MED FILL — ALPRAZolam 0.25 MG TABS: 0.25 | 15 days supply | Qty: 30 | Fill #0

## 2015-09-07 DIAGNOSIS — I10 Essential (primary) hypertension: Secondary | ICD-10-CM | POA: Diagnosis not present

## 2015-09-07 DIAGNOSIS — Z01812 Encounter for preprocedural laboratory examination: Secondary | ICD-10-CM | POA: Diagnosis not present

## 2015-09-07 DIAGNOSIS — G4733 Obstructive sleep apnea (adult) (pediatric): Secondary | ICD-10-CM | POA: Diagnosis not present

## 2015-09-07 DIAGNOSIS — K7689 Other specified diseases of liver: Secondary | ICD-10-CM | POA: Diagnosis not present

## 2015-09-07 DIAGNOSIS — R1900 Intra-abdominal and pelvic swelling, mass and lump, unspecified site: Secondary | ICD-10-CM | POA: Diagnosis not present

## 2015-09-07 DIAGNOSIS — Z01811 Encounter for preprocedural respiratory examination: Secondary | ICD-10-CM | POA: Diagnosis not present

## 2015-09-07 DIAGNOSIS — F419 Anxiety disorder, unspecified: Secondary | ICD-10-CM | POA: Diagnosis not present

## 2015-09-07 DIAGNOSIS — Z01810 Encounter for preprocedural cardiovascular examination: Secondary | ICD-10-CM | POA: Diagnosis not present

## 2015-09-07 DIAGNOSIS — Z01818 Encounter for other preprocedural examination: Secondary | ICD-10-CM | POA: Diagnosis not present

## 2015-09-07 DIAGNOSIS — M5430 Sciatica, unspecified side: Secondary | ICD-10-CM | POA: Diagnosis not present

## 2015-09-07 DIAGNOSIS — E039 Hypothyroidism, unspecified: Secondary | ICD-10-CM | POA: Diagnosis not present

## 2015-09-11 DIAGNOSIS — C561 Malignant neoplasm of right ovary: Secondary | ICD-10-CM | POA: Diagnosis not present

## 2015-09-11 DIAGNOSIS — D649 Anemia, unspecified: Secondary | ICD-10-CM | POA: Diagnosis not present

## 2015-09-11 DIAGNOSIS — C569 Malignant neoplasm of unspecified ovary: Secondary | ICD-10-CM | POA: Diagnosis not present

## 2015-09-11 DIAGNOSIS — C775 Secondary and unspecified malignant neoplasm of intrapelvic lymph nodes: Secondary | ICD-10-CM | POA: Diagnosis not present

## 2015-09-11 DIAGNOSIS — I10 Essential (primary) hypertension: Secondary | ICD-10-CM | POA: Diagnosis not present

## 2015-09-11 DIAGNOSIS — F419 Anxiety disorder, unspecified: Secondary | ICD-10-CM | POA: Diagnosis not present

## 2015-09-11 DIAGNOSIS — E039 Hypothyroidism, unspecified: Secondary | ICD-10-CM | POA: Diagnosis not present

## 2015-09-11 DIAGNOSIS — R000 Tachycardia, unspecified: Secondary | ICD-10-CM | POA: Diagnosis not present

## 2015-09-11 DIAGNOSIS — C763 Malignant neoplasm of pelvis: Secondary | ICD-10-CM | POA: Diagnosis not present

## 2015-09-11 DIAGNOSIS — N135 Crossing vessel and stricture of ureter without hydronephrosis: Secondary | ICD-10-CM | POA: Diagnosis not present

## 2015-09-11 DIAGNOSIS — G4733 Obstructive sleep apnea (adult) (pediatric): Secondary | ICD-10-CM | POA: Diagnosis not present

## 2015-09-11 DIAGNOSIS — K219 Gastro-esophageal reflux disease without esophagitis: Secondary | ICD-10-CM | POA: Diagnosis not present

## 2015-09-11 HISTORY — PX: BILATERAL SALPINGECTOMY: SHX5743

## 2015-09-11 MED ORDER — LEVOTHYROXINE SODIUM 50 MCG PO TABS: 50.0000 ug | ORAL_TABLET | Freq: Every day | ORAL | 0 refills | Status: DC

## 2015-09-14 MED FILL — ENOXAPARIN 40 MG/0.4 ML SYR: 40 | 28 days supply | Qty: 11 | Fill #0

## 2015-09-19 DIAGNOSIS — C569 Malignant neoplasm of unspecified ovary: Secondary | ICD-10-CM | POA: Diagnosis not present

## 2015-09-21 DIAGNOSIS — I10 Essential (primary) hypertension: Secondary | ICD-10-CM | POA: Diagnosis not present

## 2015-09-21 DIAGNOSIS — C569 Malignant neoplasm of unspecified ovary: Secondary | ICD-10-CM | POA: Diagnosis not present

## 2015-09-21 DIAGNOSIS — L732 Hidradenitis suppurativa: Secondary | ICD-10-CM | POA: Diagnosis not present

## 2015-09-21 DIAGNOSIS — Z880 Allergy status to penicillin: Secondary | ICD-10-CM | POA: Diagnosis not present

## 2015-09-21 DIAGNOSIS — K219 Gastro-esophageal reflux disease without esophagitis: Secondary | ICD-10-CM | POA: Diagnosis not present

## 2015-09-21 DIAGNOSIS — E669 Obesity, unspecified: Secondary | ICD-10-CM | POA: Diagnosis not present

## 2015-09-21 DIAGNOSIS — E039 Hypothyroidism, unspecified: Secondary | ICD-10-CM | POA: Diagnosis not present

## 2015-09-21 DIAGNOSIS — D630 Anemia in neoplastic disease: Secondary | ICD-10-CM | POA: Diagnosis not present

## 2015-09-21 DIAGNOSIS — Z452 Encounter for adjustment and management of vascular access device: Secondary | ICD-10-CM | POA: Diagnosis not present

## 2015-09-21 DIAGNOSIS — G4733 Obstructive sleep apnea (adult) (pediatric): Secondary | ICD-10-CM | POA: Diagnosis not present

## 2015-09-25 ENCOUNTER — Encounter: Payer: Self-pay | Admitting: Family Medicine

## 2015-09-27 DIAGNOSIS — C569 Malignant neoplasm of unspecified ovary: Secondary | ICD-10-CM | POA: Diagnosis not present

## 2015-09-27 DIAGNOSIS — Z5111 Encounter for antineoplastic chemotherapy: Secondary | ICD-10-CM | POA: Diagnosis not present

## 2015-09-27 DIAGNOSIS — Z79899 Other long term (current) drug therapy: Secondary | ICD-10-CM | POA: Diagnosis not present

## 2015-09-28 MED FILL — ONDANSETRON HCL 8 MG TABLET: 8 | 10 days supply | Qty: 30 | Fill #0

## 2015-09-28 MED FILL — PROCHLORPERAZINE 10 MG TAB: 10 | 7 days supply | Qty: 30 | Fill #0

## 2015-09-28 MED FILL — DEXAMETHASONE 4 MG TABLET: 4 | 6 days supply | Qty: 60 | Fill #0

## 2015-10-01 ENCOUNTER — Other Ambulatory Visit (HOSPITAL_COMMUNITY)
Admission: RE | Admit: 2015-10-01 | Discharge: 2015-10-01 | Disposition: A | Discharge status: Home / Self Care | Payer: 59 | Source: Ambulatory Visit | Attending: Gynecology | Admitting: Gynecology

## 2015-10-01 DIAGNOSIS — C569 Malignant neoplasm of unspecified ovary: Secondary | ICD-10-CM | POA: Diagnosis not present

## 2015-10-01 LAB — CBC WITH DIFFERENTIAL/PLATELET
BASOS PCT: 1 %
Basophils Absolute: 0 10*3/uL (ref 0.0–0.1)
EOS ABS: 0.3 10*3/uL (ref 0.0–0.7)
EOS PCT: 7 %
HEMATOCRIT: 36.4 % (ref 36.0–46.0)
Hemoglobin: 11.7 g/dL — ABNORMAL LOW (ref 12.0–15.0)
Lymphocytes Relative: 27 %
Lymphs Abs: 1.2 10*3/uL (ref 0.7–4.0)
MCH: 28.1 pg (ref 26.0–34.0)
MCHC: 32.1 g/dL (ref 30.0–36.0)
MCV: 87.5 fL (ref 78.0–100.0)
MONO ABS: 0 10*3/uL — AB (ref 0.1–1.0)
MONOS PCT: 1 %
Neutro Abs: 2.8 10*3/uL (ref 1.7–7.7)
Neutrophils Relative %: 64 %
PLATELETS: 472 10*3/uL — AB (ref 150–400)
RBC: 4.16 MIL/uL (ref 3.87–5.11)
RDW: 14.1 % (ref 11.5–15.5)
WBC: 4.3 10*3/uL (ref 4.0–10.5)

## 2015-10-08 ENCOUNTER — Other Ambulatory Visit (HOSPITAL_COMMUNITY)
Admission: RE | Admit: 2015-10-08 | Discharge: 2015-10-08 | Disposition: A | Discharge status: Home / Self Care | Payer: 59 | Source: Other Acute Inpatient Hospital | Attending: Gynecology | Admitting: Gynecology

## 2015-10-08 DIAGNOSIS — C569 Malignant neoplasm of unspecified ovary: Secondary | ICD-10-CM | POA: Insufficient documentation

## 2015-10-08 LAB — CBC WITH DIFFERENTIAL/PLATELET
BASOS ABS: 0 10*3/uL (ref 0.0–0.1)
Basophils Relative: 1 %
Eosinophils Absolute: 0.1 10*3/uL (ref 0.0–0.7)
Eosinophils Relative: 3 %
HEMATOCRIT: 30.5 % — AB (ref 36.0–46.0)
Hemoglobin: 10.1 g/dL — ABNORMAL LOW (ref 12.0–15.0)
LYMPHS ABS: 1 10*3/uL (ref 0.7–4.0)
LYMPHS PCT: 38 %
MCH: 28.9 pg (ref 26.0–34.0)
MCHC: 33.1 g/dL (ref 30.0–36.0)
MCV: 87.1 fL (ref 78.0–100.0)
MONO ABS: 0.4 10*3/uL (ref 0.1–1.0)
Monocytes Relative: 16 %
NEUTROS ABS: 1.1 10*3/uL — AB (ref 1.7–7.7)
Neutrophils Relative %: 42 %
Platelets: 245 10*3/uL (ref 150–400)
RBC: 3.5 MIL/uL — AB (ref 3.87–5.11)
RDW: 14.4 % (ref 11.5–15.5)
WBC: 2.5 10*3/uL — ABNORMAL LOW (ref 4.0–10.5)

## 2015-10-09 MED FILL — LEVOTHYROXINE 50 MCG TABLET: 50 | 90 days supply | Qty: 90 | Fill #1

## 2015-10-09 MED FILL — ALPRAZolam 0.25 MG TABS: 0.25 | 14 days supply | Qty: 28 | Fill #0

## 2015-10-15 ENCOUNTER — Other Ambulatory Visit (HOSPITAL_COMMUNITY)
Admission: RE | Admit: 2015-10-15 | Discharge: 2015-10-15 | Disposition: A | Discharge status: Home / Self Care | Payer: 59 | Source: Ambulatory Visit | Attending: Gynecology | Admitting: Gynecology

## 2015-10-15 LAB — COMPREHENSIVE METABOLIC PANEL
ALK PHOS: 74 U/L (ref 38–126)
ALT: 41 U/L (ref 14–54)
AST: 18 U/L (ref 15–41)
Albumin: 4 g/dL (ref 3.5–5.0)
Anion gap: 8 (ref 5–15)
BILIRUBIN TOTAL: 0.5 mg/dL (ref 0.3–1.2)
BUN: 9 mg/dL (ref 6–20)
CO2: 25 mmol/L (ref 22–32)
CREATININE: 0.73 mg/dL (ref 0.44–1.00)
Calcium: 9.5 mg/dL (ref 8.9–10.3)
Chloride: 107 mmol/L (ref 101–111)
GFR calc Af Amer: >60 mL/min (ref >60)
Glucose, Bld: 120 mg/dL — ABNORMAL HIGH (ref 65–99)
Potassium: 3.7 mmol/L (ref 3.5–5.1)
Sodium: 140 mmol/L (ref 135–145)
TOTAL PROTEIN: 7.4 g/dL (ref 6.5–8.1)

## 2015-10-15 LAB — CBC WITH DIFFERENTIAL/PLATELET
BASOS ABS: 0 10*3/uL (ref 0.0–0.1)
Basophils Relative: 1 %
Eosinophils Absolute: 0 10*3/uL (ref 0.0–0.7)
Eosinophils Relative: 1 %
HEMATOCRIT: 35.1 % — AB (ref 36.0–46.0)
Hemoglobin: 11.2 g/dL — ABNORMAL LOW (ref 12.0–15.0)
LYMPHS PCT: 27 %
Lymphs Abs: 1.8 10*3/uL (ref 0.7–4.0)
MCH: 28.2 pg (ref 26.0–34.0)
MCHC: 31.9 g/dL (ref 30.0–36.0)
MCV: 88.4 fL (ref 78.0–100.0)
MONO ABS: 0.7 10*3/uL (ref 0.1–1.0)
Monocytes Relative: 10 %
NEUTROS ABS: 4.1 10*3/uL (ref 1.7–7.7)
Neutrophils Relative %: 61 %
Platelets: 254 10*3/uL (ref 150–400)
RBC: 3.97 MIL/uL (ref 3.87–5.11)
RDW: 15.9 % — ABNORMAL HIGH (ref 11.5–15.5)
WBC: 6.6 10*3/uL (ref 4.0–10.5)

## 2015-10-15 LAB — MAGNESIUM: MAGNESIUM: 2 mg/dL (ref 1.7–2.4)

## 2015-10-18 DIAGNOSIS — Z90710 Acquired absence of both cervix and uterus: Secondary | ICD-10-CM | POA: Diagnosis not present

## 2015-10-18 DIAGNOSIS — E039 Hypothyroidism, unspecified: Secondary | ICD-10-CM | POA: Diagnosis not present

## 2015-10-18 DIAGNOSIS — R1900 Intra-abdominal and pelvic swelling, mass and lump, unspecified site: Secondary | ICD-10-CM | POA: Diagnosis not present

## 2015-10-18 DIAGNOSIS — G4733 Obstructive sleep apnea (adult) (pediatric): Secondary | ICD-10-CM | POA: Diagnosis not present

## 2015-10-18 DIAGNOSIS — K7689 Other specified diseases of liver: Secondary | ICD-10-CM | POA: Diagnosis not present

## 2015-10-18 DIAGNOSIS — Z5111 Encounter for antineoplastic chemotherapy: Secondary | ICD-10-CM | POA: Diagnosis not present

## 2015-10-18 DIAGNOSIS — E669 Obesity, unspecified: Secondary | ICD-10-CM | POA: Diagnosis not present

## 2015-10-18 DIAGNOSIS — I10 Essential (primary) hypertension: Secondary | ICD-10-CM | POA: Diagnosis not present

## 2015-10-18 DIAGNOSIS — C569 Malignant neoplasm of unspecified ovary: Secondary | ICD-10-CM | POA: Diagnosis not present

## 2015-10-22 ENCOUNTER — Other Ambulatory Visit (HOSPITAL_COMMUNITY)
Admission: RE | Admit: 2015-10-22 | Discharge: 2015-10-22 | Disposition: A | Discharge status: Home / Self Care | Payer: 59 | Source: Ambulatory Visit | Attending: Gynecology | Admitting: Gynecology

## 2015-10-22 DIAGNOSIS — C569 Malignant neoplasm of unspecified ovary: Secondary | ICD-10-CM | POA: Diagnosis not present

## 2015-10-22 LAB — CBC WITH DIFFERENTIAL/PLATELET
BASOS PCT: 0 %
Basophils Absolute: 0 10*3/uL (ref 0.0–0.1)
Eosinophils Absolute: 0.1 10*3/uL (ref 0.0–0.7)
Eosinophils Relative: 1 %
HEMATOCRIT: 36.1 % (ref 36.0–46.0)
Hemoglobin: 11.9 g/dL — ABNORMAL LOW (ref 12.0–15.0)
Lymphocytes Relative: 23 %
Lymphs Abs: 1.2 10*3/uL (ref 0.7–4.0)
MCH: 28.1 pg (ref 26.0–34.0)
MCHC: 33 g/dL (ref 30.0–36.0)
MCV: 85.3 fL (ref 78.0–100.0)
MONO ABS: 0 10*3/uL — AB (ref 0.1–1.0)
MONOS PCT: 1 %
NEUTROS ABS: 3.8 10*3/uL (ref 1.7–7.7)
Neutrophils Relative %: 75 %
Platelets: 221 10*3/uL (ref 150–400)
RBC: 4.23 MIL/uL (ref 3.87–5.11)
RDW: 15.4 % (ref 11.5–15.5)
WBC: 5.1 10*3/uL (ref 4.0–10.5)

## 2015-10-26 DIAGNOSIS — Z760 Encounter for issue of repeat prescription: Secondary | ICD-10-CM | POA: Diagnosis not present

## 2015-10-29 ENCOUNTER — Other Ambulatory Visit (HOSPITAL_COMMUNITY)
Admission: RE | Admit: 2015-10-29 | Discharge: 2015-10-29 | Disposition: A | Discharge status: Home / Self Care | Payer: 59 | Source: Ambulatory Visit | Attending: Gynecology | Admitting: Gynecology

## 2015-10-29 DIAGNOSIS — C569 Malignant neoplasm of unspecified ovary: Secondary | ICD-10-CM | POA: Insufficient documentation

## 2015-10-29 LAB — CBC WITH DIFFERENTIAL/PLATELET
BASOS PCT: 1 %
Basophils Absolute: 0 10*3/uL (ref 0.0–0.1)
EOS ABS: 0.1 10*3/uL (ref 0.0–0.7)
EOS PCT: 3 %
HCT: 29.4 % — ABNORMAL LOW (ref 36.0–46.0)
HEMOGLOBIN: 9.6 g/dL — AB (ref 12.0–15.0)
LYMPHS PCT: 59 %
Lymphs Abs: 1.5 10*3/uL (ref 0.7–4.0)
MCH: 28.6 pg (ref 26.0–34.0)
MCHC: 32.7 g/dL (ref 30.0–36.0)
MCV: 87.5 fL (ref 78.0–100.0)
MONO ABS: 0.4 10*3/uL (ref 0.1–1.0)
Monocytes Relative: 15 %
NEUTROS PCT: 22 %
Neutro Abs: 0.6 10*3/uL — ABNORMAL LOW (ref 1.7–7.7)
PLATELETS: 123 10*3/uL — AB (ref 150–400)
RBC: 3.36 MIL/uL — AB (ref 3.87–5.11)
RDW: 15.1 % (ref 11.5–15.5)
WBC: 2.6 10*3/uL — AB (ref 4.0–10.5)

## 2015-11-05 ENCOUNTER — Other Ambulatory Visit (HOSPITAL_COMMUNITY)
Admission: RE | Admit: 2015-11-05 | Discharge: 2015-11-05 | Disposition: A | Discharge status: Home / Self Care | Payer: 59 | Source: Ambulatory Visit | Attending: Gynecology | Admitting: Gynecology

## 2015-11-05 DIAGNOSIS — C569 Malignant neoplasm of unspecified ovary: Secondary | ICD-10-CM | POA: Diagnosis not present

## 2015-11-05 LAB — CBC WITH DIFFERENTIAL/PLATELET
BASOS ABS: 0 10*3/uL (ref 0.0–0.1)
BASOS PCT: 0 %
EOS ABS: 0 10*3/uL (ref 0.0–0.7)
Eosinophils Relative: 1 %
HEMATOCRIT: 34.8 % — AB (ref 36.0–46.0)
HEMOGLOBIN: 11 g/dL — AB (ref 12.0–15.0)
Lymphocytes Relative: 30 %
Lymphs Abs: 1.8 10*3/uL (ref 0.7–4.0)
MCH: 28 pg (ref 26.0–34.0)
MCHC: 31.6 g/dL (ref 30.0–36.0)
MCV: 88.5 fL (ref 78.0–100.0)
MONO ABS: 0.5 10*3/uL (ref 0.1–1.0)
MONOS PCT: 9 %
Neutro Abs: 3.6 10*3/uL (ref 1.7–7.7)
Neutrophils Relative %: 60 %
Platelets: 251 10*3/uL (ref 150–400)
RBC: 3.93 MIL/uL (ref 3.87–5.11)
RDW: 16.3 % — ABNORMAL HIGH (ref 11.5–15.5)
WBC: 5.9 10*3/uL (ref 4.0–10.5)

## 2015-11-05 LAB — COMPREHENSIVE METABOLIC PANEL
ALK PHOS: 76 U/L (ref 38–126)
ALT: 32 U/L (ref 14–54)
ANION GAP: 10 (ref 5–15)
AST: 20 U/L (ref 15–41)
Albumin: 4.1 g/dL (ref 3.5–5.0)
BILIRUBIN TOTAL: 0.6 mg/dL (ref 0.3–1.2)
BUN: 11 mg/dL (ref 6–20)
CALCIUM: 9.1 mg/dL (ref 8.9–10.3)
CO2: 27 mmol/L (ref 22–32)
Chloride: 102 mmol/L (ref 101–111)
Creatinine, Ser: 0.66 mg/dL (ref 0.44–1.00)
GFR calc non Af Amer: >60 mL/min (ref >60)
GLUCOSE: 86 mg/dL (ref 65–99)
Potassium: 3.9 mmol/L (ref 3.5–5.1)
Sodium: 139 mmol/L (ref 135–145)
TOTAL PROTEIN: 7.5 g/dL (ref 6.5–8.1)

## 2015-11-05 LAB — MAGNESIUM: Magnesium: 2 mg/dL (ref 1.7–2.4)

## 2015-11-08 DIAGNOSIS — K7689 Other specified diseases of liver: Secondary | ICD-10-CM | POA: Diagnosis not present

## 2015-11-08 DIAGNOSIS — E039 Hypothyroidism, unspecified: Secondary | ICD-10-CM | POA: Diagnosis not present

## 2015-11-08 DIAGNOSIS — Z5111 Encounter for antineoplastic chemotherapy: Secondary | ICD-10-CM | POA: Diagnosis not present

## 2015-11-08 DIAGNOSIS — E669 Obesity, unspecified: Secondary | ICD-10-CM | POA: Diagnosis not present

## 2015-11-08 DIAGNOSIS — G4733 Obstructive sleep apnea (adult) (pediatric): Secondary | ICD-10-CM | POA: Diagnosis not present

## 2015-11-08 DIAGNOSIS — I10 Essential (primary) hypertension: Secondary | ICD-10-CM | POA: Diagnosis not present

## 2015-11-08 DIAGNOSIS — Z8249 Family history of ischemic heart disease and other diseases of the circulatory system: Secondary | ICD-10-CM | POA: Diagnosis not present

## 2015-11-08 DIAGNOSIS — C569 Malignant neoplasm of unspecified ovary: Secondary | ICD-10-CM | POA: Diagnosis not present

## 2015-11-12 ENCOUNTER — Other Ambulatory Visit (HOSPITAL_COMMUNITY)
Admission: RE | Admit: 2015-11-12 | Discharge: 2015-11-12 | Disposition: A | Discharge status: Home / Self Care | Payer: 59 | Source: Ambulatory Visit | Attending: Gynecology | Admitting: Gynecology

## 2015-11-12 DIAGNOSIS — C569 Malignant neoplasm of unspecified ovary: Secondary | ICD-10-CM | POA: Diagnosis not present

## 2015-11-12 LAB — CBC WITH DIFFERENTIAL/PLATELET
Basophils Absolute: 0 10*3/uL (ref 0.0–0.1)
Basophils Relative: 0 %
Eosinophils Absolute: 0.1 10*3/uL (ref 0.0–0.7)
Eosinophils Relative: 1 %
HCT: 35.8 % — ABNORMAL LOW (ref 36.0–46.0)
HEMOGLOBIN: 11.7 g/dL — AB (ref 12.0–15.0)
LYMPHS ABS: 1.3 10*3/uL (ref 0.7–4.0)
LYMPHS PCT: 31 %
MCH: 28.7 pg (ref 26.0–34.0)
MCHC: 32.7 g/dL (ref 30.0–36.0)
MCV: 87.7 fL (ref 78.0–100.0)
MONO ABS: 0 10*3/uL — AB (ref 0.1–1.0)
MONOS PCT: 1 %
NEUTROS ABS: 2.9 10*3/uL (ref 1.7–7.7)
Neutrophils Relative %: 67 %
Platelets: 457 10*3/uL — ABNORMAL HIGH (ref 150–400)
RBC: 4.08 MIL/uL (ref 3.87–5.11)
RDW: 16.6 % — ABNORMAL HIGH (ref 11.5–15.5)
WBC: 4.4 10*3/uL (ref 4.0–10.5)

## 2015-11-19 ENCOUNTER — Other Ambulatory Visit (HOSPITAL_COMMUNITY)
Admission: RE | Admit: 2015-11-19 | Discharge: 2015-11-19 | Disposition: A | Discharge status: Home / Self Care | Payer: 59 | Source: Ambulatory Visit | Attending: Gynecology | Admitting: Gynecology

## 2015-11-19 DIAGNOSIS — C569 Malignant neoplasm of unspecified ovary: Secondary | ICD-10-CM | POA: Diagnosis not present

## 2015-11-19 LAB — CBC WITH DIFFERENTIAL/PLATELET
BASOS ABS: 0 10*3/uL (ref 0.0–0.1)
Basophils Relative: 1 %
EOS ABS: 0 10*3/uL (ref 0.0–0.7)
Eosinophils Relative: 1 %
HCT: 28.6 % — ABNORMAL LOW (ref 36.0–46.0)
HEMOGLOBIN: 9.6 g/dL — AB (ref 12.0–15.0)
LYMPHS PCT: 68 %
Lymphs Abs: 1.5 10*3/uL (ref 0.7–4.0)
MCH: 28.7 pg (ref 26.0–34.0)
MCHC: 33.6 g/dL (ref 30.0–36.0)
MCV: 85.4 fL (ref 78.0–100.0)
Monocytes Absolute: 0.3 10*3/uL (ref 0.1–1.0)
Monocytes Relative: 15 %
NEUTROS PCT: 15 %
Neutro Abs: 0.3 10*3/uL — ABNORMAL LOW (ref 1.7–7.7)
Platelets: 170 10*3/uL (ref 150–400)
RBC: 3.35 MIL/uL — ABNORMAL LOW (ref 3.87–5.11)
RDW: 16.1 % — ABNORMAL HIGH (ref 11.5–15.5)
WBC: 2.1 10*3/uL — AB (ref 4.0–10.5)

## 2015-11-19 LAB — PATHOLOGIST SMEAR REVIEW

## 2015-11-26 ENCOUNTER — Other Ambulatory Visit (HOSPITAL_COMMUNITY)
Admission: RE | Admit: 2015-11-26 | Discharge: 2015-11-26 | Disposition: A | Discharge status: Home / Self Care | Payer: 59 | Source: Ambulatory Visit | Attending: Gynecology | Admitting: Gynecology

## 2015-11-26 DIAGNOSIS — C569 Malignant neoplasm of unspecified ovary: Secondary | ICD-10-CM | POA: Insufficient documentation

## 2015-11-26 LAB — CBC WITH DIFFERENTIAL/PLATELET
BASOS ABS: 0 10*3/uL (ref 0.0–0.1)
BASOS PCT: 0 %
Eosinophils Absolute: 0 10*3/uL (ref 0.0–0.7)
Eosinophils Relative: 0 %
HEMATOCRIT: 32.4 % — AB (ref 36.0–46.0)
Hemoglobin: 10.5 g/dL — ABNORMAL LOW (ref 12.0–15.0)
LYMPHS PCT: 28 %
Lymphs Abs: 1.8 10*3/uL (ref 0.7–4.0)
MCH: 28.8 pg (ref 26.0–34.0)
MCHC: 32.4 g/dL (ref 30.0–36.0)
MCV: 88.8 fL (ref 78.0–100.0)
MONO ABS: 0.6 10*3/uL (ref 0.1–1.0)
Monocytes Relative: 9 %
NEUTROS ABS: 4.1 10*3/uL (ref 1.7–7.7)
Neutrophils Relative %: 63 %
PLATELETS: 120 10*3/uL — AB (ref 150–400)
RBC: 3.65 MIL/uL — AB (ref 3.87–5.11)
RDW: 17.5 % — AB (ref 11.5–15.5)
WBC: 6.6 10*3/uL (ref 4.0–10.5)

## 2015-11-26 LAB — COMPREHENSIVE METABOLIC PANEL
ALT: 37 U/L (ref 14–54)
AST: 27 U/L (ref 15–41)
Albumin: 4.2 g/dL (ref 3.5–5.0)
Alkaline Phosphatase: 80 U/L (ref 38–126)
Anion gap: 9 (ref 5–15)
BILIRUBIN TOTAL: 0.6 mg/dL (ref 0.3–1.2)
BUN: 11 mg/dL (ref 6–20)
CALCIUM: 8.9 mg/dL (ref 8.9–10.3)
CO2: 27 mmol/L (ref 22–32)
CREATININE: 0.56 mg/dL (ref 0.44–1.00)
Chloride: 101 mmol/L (ref 101–111)
GFR calc Af Amer: >60 mL/min (ref >60)
Glucose, Bld: 96 mg/dL (ref 65–99)
POTASSIUM: 3.6 mmol/L (ref 3.5–5.1)
Sodium: 137 mmol/L (ref 135–145)
TOTAL PROTEIN: 7.6 g/dL (ref 6.5–8.1)

## 2015-11-26 LAB — MAGNESIUM: MAGNESIUM: 1.8 mg/dL (ref 1.7–2.4)

## 2015-11-29 DIAGNOSIS — G4733 Obstructive sleep apnea (adult) (pediatric): Secondary | ICD-10-CM | POA: Diagnosis not present

## 2015-11-29 DIAGNOSIS — E669 Obesity, unspecified: Secondary | ICD-10-CM | POA: Diagnosis not present

## 2015-11-29 DIAGNOSIS — I10 Essential (primary) hypertension: Secondary | ICD-10-CM | POA: Diagnosis not present

## 2015-11-29 DIAGNOSIS — C569 Malignant neoplasm of unspecified ovary: Secondary | ICD-10-CM | POA: Diagnosis not present

## 2015-11-29 DIAGNOSIS — Z90710 Acquired absence of both cervix and uterus: Secondary | ICD-10-CM | POA: Diagnosis not present

## 2015-11-29 DIAGNOSIS — Z8249 Family history of ischemic heart disease and other diseases of the circulatory system: Secondary | ICD-10-CM | POA: Diagnosis not present

## 2015-11-29 DIAGNOSIS — E039 Hypothyroidism, unspecified: Secondary | ICD-10-CM | POA: Diagnosis not present

## 2015-11-29 DIAGNOSIS — Z79899 Other long term (current) drug therapy: Secondary | ICD-10-CM | POA: Diagnosis not present

## 2015-11-29 DIAGNOSIS — Z5111 Encounter for antineoplastic chemotherapy: Secondary | ICD-10-CM | POA: Diagnosis not present

## 2015-12-03 ENCOUNTER — Other Ambulatory Visit (HOSPITAL_COMMUNITY)
Admission: RE | Admit: 2015-12-03 | Discharge: 2015-12-03 | Disposition: A | Discharge status: Home / Self Care | Payer: 59 | Source: Ambulatory Visit | Attending: Gynecology | Admitting: Gynecology

## 2015-12-03 DIAGNOSIS — C569 Malignant neoplasm of unspecified ovary: Secondary | ICD-10-CM | POA: Insufficient documentation

## 2015-12-03 LAB — CBC WITH DIFFERENTIAL/PLATELET
BASOS ABS: 0 10*3/uL (ref 0.0–0.1)
BASOS PCT: 0 %
EOS ABS: 0.1 10*3/uL (ref 0.0–0.7)
EOS PCT: 1 %
HCT: 32.7 % — ABNORMAL LOW (ref 36.0–46.0)
Hemoglobin: 10.9 g/dL — ABNORMAL LOW (ref 12.0–15.0)
Lymphocytes Relative: 30 %
Lymphs Abs: 1.1 10*3/uL (ref 0.7–4.0)
MCH: 29.2 pg (ref 26.0–34.0)
MCHC: 33.3 g/dL (ref 30.0–36.0)
MCV: 87.7 fL (ref 78.0–100.0)
MONO ABS: 0 10*3/uL — AB (ref 0.1–1.0)
Monocytes Relative: 1 %
Neutro Abs: 2.4 10*3/uL (ref 1.7–7.7)
Neutrophils Relative %: 68 %
PLATELETS: 224 10*3/uL (ref 150–400)
RBC: 3.73 MIL/uL — AB (ref 3.87–5.11)
RDW: 17.6 % — AB (ref 11.5–15.5)
WBC: 3.6 10*3/uL — AB (ref 4.0–10.5)

## 2015-12-10 ENCOUNTER — Other Ambulatory Visit (HOSPITAL_COMMUNITY)
Admission: RE | Admit: 2015-12-10 | Discharge: 2015-12-10 | Disposition: A | Discharge status: Home / Self Care | Payer: 59 | Source: Ambulatory Visit | Attending: Gynecology | Admitting: Gynecology

## 2015-12-10 DIAGNOSIS — C569 Malignant neoplasm of unspecified ovary: Secondary | ICD-10-CM | POA: Diagnosis not present

## 2015-12-10 LAB — CBC WITH DIFFERENTIAL/PLATELET
BASOS ABS: 0 10*3/uL (ref 0.0–0.1)
BASOS PCT: 0 %
Eosinophils Absolute: 0 10*3/uL (ref 0.0–0.7)
Eosinophils Relative: 2 %
HEMATOCRIT: 27.7 % — AB (ref 36.0–46.0)
Hemoglobin: 9.3 g/dL — ABNORMAL LOW (ref 12.0–15.0)
LYMPHS ABS: 1.5 10*3/uL (ref 0.7–4.0)
LYMPHS PCT: 64 %
MCH: 29.8 pg (ref 26.0–34.0)
MCHC: 33.6 g/dL (ref 30.0–36.0)
MCV: 88.8 fL (ref 78.0–100.0)
MONOS PCT: 23 %
Monocytes Absolute: 0.5 10*3/uL (ref 0.1–1.0)
NEUTROS ABS: 0.2 10*3/uL — AB (ref 1.7–7.7)
Neutrophils Relative %: 11 %
PLATELETS: 138 10*3/uL — AB (ref 150–400)
RBC: 3.12 MIL/uL — ABNORMAL LOW (ref 3.87–5.11)
RDW: 17.4 % — AB (ref 11.5–15.5)
WBC: 2.2 10*3/uL — ABNORMAL LOW (ref 4.0–10.5)

## 2015-12-17 ENCOUNTER — Other Ambulatory Visit (HOSPITAL_COMMUNITY)
Admission: RE | Admit: 2015-12-17 | Discharge: 2015-12-17 | Disposition: A | Discharge status: Home / Self Care | Payer: 59 | Source: Ambulatory Visit | Attending: Gynecology | Admitting: Gynecology

## 2015-12-17 DIAGNOSIS — C569 Malignant neoplasm of unspecified ovary: Secondary | ICD-10-CM | POA: Insufficient documentation

## 2015-12-17 LAB — CBC WITH DIFFERENTIAL/PLATELET
BASOS ABS: 0 10*3/uL (ref 0.0–0.1)
Basophils Relative: 0 %
EOS ABS: 0 10*3/uL (ref 0.0–0.7)
EOS PCT: 0 %
HCT: 29.4 % — ABNORMAL LOW (ref 36.0–46.0)
Hemoglobin: 9.9 g/dL — ABNORMAL LOW (ref 12.0–15.0)
LYMPHS PCT: 34 %
Lymphs Abs: 1.6 10*3/uL (ref 0.7–4.0)
MCH: 30.2 pg (ref 26.0–34.0)
MCHC: 33.7 g/dL (ref 30.0–36.0)
MCV: 89.6 fL (ref 78.0–100.0)
Monocytes Absolute: 0.5 10*3/uL (ref 0.1–1.0)
Monocytes Relative: 10 %
NEUTROS ABS: 2.6 10*3/uL (ref 1.7–7.7)
NEUTROS PCT: 56 %
PLATELETS: 126 10*3/uL — AB (ref 150–400)
RBC: 3.28 MIL/uL — AB (ref 3.87–5.11)
RDW: 18.8 % — ABNORMAL HIGH (ref 11.5–15.5)
WBC: 4.8 10*3/uL (ref 4.0–10.5)

## 2015-12-17 LAB — COMPREHENSIVE METABOLIC PANEL
ALT: 34 U/L (ref 14–54)
AST: 26 U/L (ref 15–41)
Albumin: 4.3 g/dL (ref 3.5–5.0)
Alkaline Phosphatase: 72 U/L (ref 38–126)
Anion gap: 9 (ref 5–15)
BUN: 8 mg/dL (ref 6–20)
CHLORIDE: 102 mmol/L (ref 101–111)
CO2: 28 mmol/L (ref 22–32)
CREATININE: 0.77 mg/dL (ref 0.44–1.00)
Calcium: 9.6 mg/dL (ref 8.9–10.3)
GFR calc non Af Amer: >60 mL/min (ref >60)
Glucose, Bld: 96 mg/dL (ref 65–99)
Potassium: 3.9 mmol/L (ref 3.5–5.1)
SODIUM: 139 mmol/L (ref 135–145)
Total Bilirubin: 0.8 mg/dL (ref 0.3–1.2)
Total Protein: 7.4 g/dL (ref 6.5–8.1)

## 2015-12-17 LAB — MAGNESIUM: Magnesium: 1.6 mg/dL — ABNORMAL LOW (ref 1.7–2.4)

## 2015-12-20 DIAGNOSIS — K7689 Other specified diseases of liver: Secondary | ICD-10-CM | POA: Diagnosis not present

## 2015-12-20 DIAGNOSIS — Z880 Allergy status to penicillin: Secondary | ICD-10-CM | POA: Diagnosis not present

## 2015-12-20 DIAGNOSIS — K5900 Constipation, unspecified: Secondary | ICD-10-CM | POA: Diagnosis not present

## 2015-12-20 DIAGNOSIS — M898X9 Other specified disorders of bone, unspecified site: Secondary | ICD-10-CM | POA: Diagnosis not present

## 2015-12-20 DIAGNOSIS — E039 Hypothyroidism, unspecified: Secondary | ICD-10-CM | POA: Diagnosis not present

## 2015-12-20 DIAGNOSIS — Z5111 Encounter for antineoplastic chemotherapy: Secondary | ICD-10-CM | POA: Diagnosis not present

## 2015-12-20 DIAGNOSIS — I10 Essential (primary) hypertension: Secondary | ICD-10-CM | POA: Diagnosis not present

## 2015-12-20 DIAGNOSIS — C772 Secondary and unspecified malignant neoplasm of intra-abdominal lymph nodes: Secondary | ICD-10-CM | POA: Diagnosis not present

## 2015-12-20 DIAGNOSIS — C569 Malignant neoplasm of unspecified ovary: Secondary | ICD-10-CM | POA: Diagnosis not present

## 2015-12-24 ENCOUNTER — Other Ambulatory Visit (HOSPITAL_COMMUNITY)
Admission: RE | Admit: 2015-12-24 | Discharge: 2015-12-24 | Disposition: A | Discharge status: Home / Self Care | Payer: 59 | Source: Ambulatory Visit | Attending: Gynecology | Admitting: Gynecology

## 2015-12-24 DIAGNOSIS — C569 Malignant neoplasm of unspecified ovary: Secondary | ICD-10-CM | POA: Diagnosis not present

## 2015-12-24 LAB — CBC WITH DIFFERENTIAL/PLATELET
Basophils Absolute: 0 10*3/uL (ref 0.0–0.1)
Basophils Relative: 0 %
EOS ABS: 0 10*3/uL (ref 0.0–0.7)
EOS PCT: 1 %
HCT: 30.5 % — ABNORMAL LOW (ref 36.0–46.0)
Hemoglobin: 10.3 g/dL — ABNORMAL LOW (ref 12.0–15.0)
LYMPHS ABS: 1.3 10*3/uL (ref 0.7–4.0)
Lymphocytes Relative: 48 %
MCH: 30.5 pg (ref 26.0–34.0)
MCHC: 33.8 g/dL (ref 30.0–36.0)
MCV: 90.2 fL (ref 78.0–100.0)
MONOS PCT: 1 %
Monocytes Absolute: 0 10*3/uL — ABNORMAL LOW (ref 0.1–1.0)
Neutro Abs: 1.3 10*3/uL — ABNORMAL LOW (ref 1.7–7.7)
Neutrophils Relative %: 50 %
PLATELETS: 202 10*3/uL (ref 150–400)
RBC: 3.38 MIL/uL — ABNORMAL LOW (ref 3.87–5.11)
RDW: 18.6 % — ABNORMAL HIGH (ref 11.5–15.5)
WBC: 2.6 10*3/uL — AB (ref 4.0–10.5)

## 2015-12-31 ENCOUNTER — Other Ambulatory Visit (HOSPITAL_COMMUNITY)
Admission: RE | Admit: 2015-12-31 | Discharge: 2015-12-31 | Disposition: A | Discharge status: Home / Self Care | Payer: 59 | Source: Ambulatory Visit | Attending: Gynecology | Admitting: Gynecology

## 2015-12-31 DIAGNOSIS — C569 Malignant neoplasm of unspecified ovary: Secondary | ICD-10-CM | POA: Insufficient documentation

## 2015-12-31 LAB — CBC WITH DIFFERENTIAL/PLATELET
BASOS ABS: 0 10*3/uL (ref 0.0–0.1)
Basophils Relative: 0 %
EOS ABS: 0 10*3/uL (ref 0.0–0.7)
Eosinophils Relative: 1 %
HCT: 28.2 % — ABNORMAL LOW (ref 36.0–46.0)
Hemoglobin: 9.3 g/dL — ABNORMAL LOW (ref 12.0–15.0)
LYMPHS ABS: 1.3 10*3/uL (ref 0.7–4.0)
Lymphocytes Relative: 67 %
MCH: 30.1 pg (ref 26.0–34.0)
MCHC: 33 g/dL (ref 30.0–36.0)
MCV: 91.3 fL (ref 78.0–100.0)
MONO ABS: 0.5 10*3/uL (ref 0.1–1.0)
Monocytes Relative: 27 %
NEUTROS ABS: 0.1 10*3/uL — AB (ref 1.7–7.7)
Neutrophils Relative %: 5 %
PLATELETS: 89 10*3/uL — AB (ref 150–400)
RBC: 3.09 MIL/uL — AB (ref 3.87–5.11)
RDW: 17.9 % — AB (ref 11.5–15.5)
WBC: 1.9 10*3/uL — AB (ref 4.0–10.5)

## 2016-01-02 MED FILL — CIPROFLOXACIN HCL 500 MG TA: 500 | 10 days supply | Qty: 20 | Fill #0

## 2016-01-07 ENCOUNTER — Other Ambulatory Visit (HOSPITAL_COMMUNITY)
Admission: RE | Admit: 2016-01-07 | Discharge: 2016-01-07 | Disposition: A | Discharge status: Home / Self Care | Payer: 59 | Source: Ambulatory Visit | Attending: Gynecology | Admitting: Gynecology

## 2016-01-07 DIAGNOSIS — C569 Malignant neoplasm of unspecified ovary: Secondary | ICD-10-CM | POA: Diagnosis not present

## 2016-01-07 LAB — CBC WITH DIFFERENTIAL/PLATELET
BASOS PCT: 0 %
Basophils Absolute: 0 10*3/uL (ref 0.0–0.1)
Eosinophils Absolute: 0 10*3/uL (ref 0.0–0.7)
Eosinophils Relative: 0 %
HEMATOCRIT: 26.1 % — AB (ref 36.0–46.0)
HEMOGLOBIN: 9.1 g/dL — AB (ref 12.0–15.0)
Lymphocytes Relative: 34 %
Lymphs Abs: 1.8 10*3/uL (ref 0.7–4.0)
MCH: 34.1 pg — ABNORMAL HIGH (ref 26.0–34.0)
MCHC: 34.9 g/dL (ref 30.0–36.0)
MCV: 97.8 fL (ref 78.0–100.0)
Monocytes Absolute: 0.7 10*3/uL (ref 0.1–1.0)
Monocytes Relative: 13 %
NEUTROS ABS: 2.7 10*3/uL (ref 1.7–7.7)
NEUTROS PCT: 53 %
Platelets: 117 10*3/uL — ABNORMAL LOW (ref 150–400)
RBC: 2.67 MIL/uL — ABNORMAL LOW (ref 3.87–5.11)
RDW: 19.6 % — ABNORMAL HIGH (ref 11.5–15.5)
WBC: 5.2 10*3/uL (ref 4.0–10.5)

## 2016-01-07 LAB — COMPREHENSIVE METABOLIC PANEL
ALBUMIN: 4 g/dL (ref 3.5–5.0)
ALK PHOS: 66 U/L (ref 38–126)
ALT: 31 U/L (ref 14–54)
AST: 21 U/L (ref 15–41)
Anion gap: 7 (ref 5–15)
BILIRUBIN TOTAL: 0.6 mg/dL (ref 0.3–1.2)
BUN: 7 mg/dL (ref 6–20)
CALCIUM: 9 mg/dL (ref 8.9–10.3)
CO2: 28 mmol/L (ref 22–32)
Chloride: 104 mmol/L (ref 101–111)
Creatinine, Ser: 0.66 mg/dL (ref 0.44–1.00)
GFR calc Af Amer: >60 mL/min (ref >60)
GFR calc non Af Amer: >60 mL/min (ref >60)
GLUCOSE: 92 mg/dL (ref 65–99)
Potassium: 3.9 mmol/L (ref 3.5–5.1)
Sodium: 139 mmol/L (ref 135–145)
TOTAL PROTEIN: 7.3 g/dL (ref 6.5–8.1)

## 2016-01-07 LAB — MAGNESIUM: Magnesium: 2.1 mg/dL (ref 1.7–2.4)

## 2016-01-09 ENCOUNTER — Other Ambulatory Visit: Payer: Self-pay | Admitting: Family Medicine

## 2016-01-09 MED ORDER — LEVOTHYROXINE SODIUM 50 MCG PO TABS: 50.0000 ug | ORAL_TABLET | Freq: Every day | ORAL | 3 refills | Status: DC

## 2016-01-09 MED FILL — LEVOTHYROXINE 50 MCG TABLET: 50 | 90 days supply | Qty: 90 | Fill #0

## 2016-01-10 DIAGNOSIS — C569 Malignant neoplasm of unspecified ovary: Secondary | ICD-10-CM | POA: Diagnosis not present

## 2016-01-10 DIAGNOSIS — Z5111 Encounter for antineoplastic chemotherapy: Secondary | ICD-10-CM | POA: Diagnosis not present

## 2016-01-10 DIAGNOSIS — R5383 Other fatigue: Secondary | ICD-10-CM | POA: Diagnosis not present

## 2016-01-14 ENCOUNTER — Other Ambulatory Visit (HOSPITAL_COMMUNITY)
Admission: RE | Admit: 2016-01-14 | Discharge: 2016-01-14 | Disposition: A | Discharge status: Home / Self Care | Payer: 59 | Source: Ambulatory Visit | Attending: Gynecology | Admitting: Gynecology

## 2016-01-14 DIAGNOSIS — C569 Malignant neoplasm of unspecified ovary: Secondary | ICD-10-CM | POA: Diagnosis not present

## 2016-01-14 LAB — CBC WITH DIFFERENTIAL/PLATELET
Basophils Absolute: 0 10*3/uL (ref 0.0–0.1)
Basophils Relative: 0 %
EOS ABS: 0 10*3/uL (ref 0.0–0.7)
Eosinophils Relative: 0 %
HCT: 28.5 % — ABNORMAL LOW (ref 36.0–46.0)
HEMOGLOBIN: 9.8 g/dL — AB (ref 12.0–15.0)
LYMPHS ABS: 1 10*3/uL (ref 0.7–4.0)
LYMPHS PCT: 39 %
MCH: 32.8 pg (ref 26.0–34.0)
MCHC: 34.4 g/dL (ref 30.0–36.0)
MCV: 95.3 fL (ref 78.0–100.0)
MONOS PCT: 1 %
Monocytes Absolute: 0 10*3/uL — ABNORMAL LOW (ref 0.1–1.0)
NEUTROS PCT: 60 %
Neutro Abs: 1.5 10*3/uL — ABNORMAL LOW (ref 1.7–7.7)
Platelets: 179 10*3/uL (ref 150–400)
RBC: 2.99 MIL/uL — AB (ref 3.87–5.11)
RDW: 18.4 % — ABNORMAL HIGH (ref 11.5–15.5)
WBC: 2.5 10*3/uL — AB (ref 4.0–10.5)

## 2016-01-21 ENCOUNTER — Other Ambulatory Visit (HOSPITAL_COMMUNITY)
Admission: RE | Admit: 2016-01-21 | Discharge: 2016-01-21 | Disposition: A | Discharge status: Home / Self Care | Payer: 59 | Source: Ambulatory Visit | Attending: Gynecology | Admitting: Gynecology

## 2016-01-21 DIAGNOSIS — C569 Malignant neoplasm of unspecified ovary: Secondary | ICD-10-CM | POA: Insufficient documentation

## 2016-01-21 LAB — CBC WITH DIFFERENTIAL/PLATELET
BASOS ABS: 0 10*3/uL (ref 0.0–0.1)
Basophils Relative: 0 %
EOS PCT: 0 %
Eosinophils Absolute: 0 10*3/uL (ref 0.0–0.7)
HEMATOCRIT: 21.9 % — AB (ref 36.0–46.0)
HEMOGLOBIN: 7.5 g/dL — AB (ref 12.0–15.0)
LYMPHS PCT: 67 %
Lymphs Abs: 1.5 10*3/uL (ref 0.7–4.0)
MCH: 31.8 pg (ref 26.0–34.0)
MCHC: 34.2 g/dL (ref 30.0–36.0)
MCV: 92.8 fL (ref 78.0–100.0)
MONOS PCT: 24 %
Monocytes Absolute: 0.6 10*3/uL (ref 0.1–1.0)
NEUTROS ABS: 0.2 10*3/uL — AB (ref 1.7–7.7)
Neutrophils Relative %: 9 %
Platelets: 57 10*3/uL — ABNORMAL LOW (ref 150–400)
RBC: 2.36 MIL/uL — ABNORMAL LOW (ref 3.87–5.11)
RDW: 17.3 % — ABNORMAL HIGH (ref 11.5–15.5)
WBC: 2.3 10*3/uL — ABNORMAL LOW (ref 4.0–10.5)

## 2016-01-28 ENCOUNTER — Other Ambulatory Visit (HOSPITAL_COMMUNITY)
Admit: 2016-01-28 | Discharge: 2016-01-28 | Disposition: A | Discharge status: Home / Self Care | Payer: 59 | Source: Ambulatory Visit | Attending: Gynecology | Admitting: Gynecology

## 2016-01-28 DIAGNOSIS — C569 Malignant neoplasm of unspecified ovary: Secondary | ICD-10-CM | POA: Diagnosis not present

## 2016-01-28 LAB — COMPREHENSIVE METABOLIC PANEL
ALBUMIN: 4.1 g/dL (ref 3.5–5.0)
ALK PHOS: 66 U/L (ref 38–126)
ALT: 25 U/L (ref 14–54)
ANION GAP: 11 (ref 5–15)
AST: 21 U/L (ref 15–41)
BUN: 10 mg/dL (ref 6–20)
CALCIUM: 9.4 mg/dL (ref 8.9–10.3)
CHLORIDE: 101 mmol/L (ref 101–111)
CO2: 28 mmol/L (ref 22–32)
CREATININE: 0.89 mg/dL (ref 0.44–1.00)
GFR calc Af Amer: >60 mL/min (ref >60)
GFR calc non Af Amer: >60 mL/min (ref >60)
GLUCOSE: 121 mg/dL — AB (ref 65–99)
Potassium: 3.5 mmol/L (ref 3.5–5.1)
SODIUM: 140 mmol/L (ref 135–145)
Total Bilirubin: 0.5 mg/dL (ref 0.3–1.2)
Total Protein: 7.3 g/dL (ref 6.5–8.1)

## 2016-01-28 LAB — CBC WITH DIFFERENTIAL/PLATELET
BASOS ABS: 0 10*3/uL (ref 0.0–0.1)
BASOS PCT: 0 %
EOS ABS: 0 10*3/uL (ref 0.0–0.7)
Eosinophils Relative: 0 %
HCT: 23.3 % — ABNORMAL LOW (ref 36.0–46.0)
HEMOGLOBIN: 7.8 g/dL — AB (ref 12.0–15.0)
Lymphocytes Relative: 34 %
Lymphs Abs: 1.5 10*3/uL (ref 0.7–4.0)
MCH: 32.8 pg (ref 26.0–34.0)
MCHC: 33.5 g/dL (ref 30.0–36.0)
MCV: 97.9 fL (ref 78.0–100.0)
Monocytes Absolute: 0.4 10*3/uL (ref 0.1–1.0)
Monocytes Relative: 10 %
NEUTROS PCT: 56 %
Neutro Abs: 2.5 10*3/uL (ref 1.7–7.7)
Platelets: 59 10*3/uL — ABNORMAL LOW (ref 150–400)
RBC: 2.38 MIL/uL — AB (ref 3.87–5.11)
RDW: 19 % — ABNORMAL HIGH (ref 11.5–15.5)
WBC: 4.5 10*3/uL (ref 4.0–10.5)

## 2016-01-28 LAB — MAGNESIUM: Magnesium: 1.6 mg/dL — ABNORMAL LOW (ref 1.7–2.4)

## 2016-01-29 MED FILL — DEXAMETHASONE 4 MG TABLET: 4 | 6 days supply | Qty: 60 | Fill #0

## 2016-01-31 DIAGNOSIS — T451X5A Adverse effect of antineoplastic and immunosuppressive drugs, initial encounter: Secondary | ICD-10-CM | POA: Diagnosis not present

## 2016-01-31 DIAGNOSIS — Z888 Allergy status to other drugs, medicaments and biological substances status: Secondary | ICD-10-CM | POA: Diagnosis not present

## 2016-01-31 DIAGNOSIS — E039 Hypothyroidism, unspecified: Secondary | ICD-10-CM | POA: Diagnosis not present

## 2016-01-31 DIAGNOSIS — D696 Thrombocytopenia, unspecified: Secondary | ICD-10-CM | POA: Diagnosis not present

## 2016-01-31 DIAGNOSIS — G629 Polyneuropathy, unspecified: Secondary | ICD-10-CM | POA: Diagnosis not present

## 2016-01-31 DIAGNOSIS — Z8249 Family history of ischemic heart disease and other diseases of the circulatory system: Secondary | ICD-10-CM | POA: Diagnosis not present

## 2016-01-31 DIAGNOSIS — D6481 Anemia due to antineoplastic chemotherapy: Secondary | ICD-10-CM | POA: Diagnosis not present

## 2016-01-31 DIAGNOSIS — C569 Malignant neoplasm of unspecified ovary: Secondary | ICD-10-CM | POA: Diagnosis not present

## 2016-01-31 DIAGNOSIS — K7689 Other specified diseases of liver: Secondary | ICD-10-CM | POA: Diagnosis not present

## 2016-01-31 DIAGNOSIS — I10 Essential (primary) hypertension: Secondary | ICD-10-CM | POA: Diagnosis not present

## 2016-02-04 ENCOUNTER — Other Ambulatory Visit (HOSPITAL_COMMUNITY)
Admission: RE | Admit: 2016-02-04 | Discharge: 2016-02-04 | Disposition: A | Discharge status: Home / Self Care | Payer: 59 | Source: Other Acute Inpatient Hospital | Attending: Gynecology | Admitting: Gynecology

## 2016-02-04 DIAGNOSIS — C569 Malignant neoplasm of unspecified ovary: Secondary | ICD-10-CM | POA: Insufficient documentation

## 2016-02-04 LAB — MAGNESIUM: MAGNESIUM: 1.7 mg/dL (ref 1.7–2.4)

## 2016-02-04 LAB — CBC WITH DIFFERENTIAL/PLATELET
Basophils Absolute: 0 10*3/uL (ref 0.0–0.1)
Basophils Relative: 0 %
EOS PCT: 0 %
Eosinophils Absolute: 0 10*3/uL (ref 0.0–0.7)
HCT: 31.7 % — ABNORMAL LOW (ref 36.0–46.0)
Hemoglobin: 10.9 g/dL — ABNORMAL LOW (ref 12.0–15.0)
LYMPHS ABS: 1.3 10*3/uL (ref 0.7–4.0)
Lymphocytes Relative: 28 %
MCH: 32.5 pg (ref 26.0–34.0)
MCHC: 34.4 g/dL (ref 30.0–36.0)
MCV: 94.6 fL (ref 78.0–100.0)
MONO ABS: 0.4 10*3/uL (ref 0.1–1.0)
Monocytes Relative: 9 %
Neutro Abs: 2.8 10*3/uL (ref 1.7–7.7)
Neutrophils Relative %: 63 %
PLATELETS: 115 10*3/uL — AB (ref 150–400)
RBC: 3.35 MIL/uL — ABNORMAL LOW (ref 3.87–5.11)
RDW: 18.9 % — AB (ref 11.5–15.5)
WBC: 4.4 10*3/uL (ref 4.0–10.5)

## 2016-02-04 LAB — COMPREHENSIVE METABOLIC PANEL
ALT: 20 U/L (ref 14–54)
AST: 18 U/L (ref 15–41)
Albumin: 4.1 g/dL (ref 3.5–5.0)
Alkaline Phosphatase: 64 U/L (ref 38–126)
Anion gap: 10 (ref 5–15)
BUN: 12 mg/dL (ref 6–20)
CO2: 26 mmol/L (ref 22–32)
CREATININE: 0.72 mg/dL (ref 0.44–1.00)
Calcium: 9.1 mg/dL (ref 8.9–10.3)
Chloride: 104 mmol/L (ref 101–111)
Glucose, Bld: 102 mg/dL — ABNORMAL HIGH (ref 65–99)
POTASSIUM: 3.9 mmol/L (ref 3.5–5.1)
Sodium: 140 mmol/L (ref 135–145)
Total Bilirubin: 0.6 mg/dL (ref 0.3–1.2)
Total Protein: 6.9 g/dL (ref 6.5–8.1)

## 2016-02-06 DIAGNOSIS — Z90710 Acquired absence of both cervix and uterus: Secondary | ICD-10-CM | POA: Diagnosis not present

## 2016-02-06 DIAGNOSIS — R911 Solitary pulmonary nodule: Secondary | ICD-10-CM | POA: Diagnosis not present

## 2016-02-06 DIAGNOSIS — R188 Other ascites: Secondary | ICD-10-CM | POA: Diagnosis not present

## 2016-02-06 DIAGNOSIS — R590 Localized enlarged lymph nodes: Secondary | ICD-10-CM | POA: Diagnosis not present

## 2016-02-06 DIAGNOSIS — K7689 Other specified diseases of liver: Secondary | ICD-10-CM | POA: Diagnosis not present

## 2016-02-06 DIAGNOSIS — R591 Generalized enlarged lymph nodes: Secondary | ICD-10-CM | POA: Diagnosis not present

## 2016-02-06 DIAGNOSIS — C569 Malignant neoplasm of unspecified ovary: Secondary | ICD-10-CM | POA: Diagnosis not present

## 2016-02-06 DIAGNOSIS — Z90722 Acquired absence of ovaries, bilateral: Secondary | ICD-10-CM | POA: Diagnosis not present

## 2016-02-11 ENCOUNTER — Other Ambulatory Visit (HOSPITAL_COMMUNITY)
Admission: RE | Admit: 2016-02-11 | Discharge: 2016-02-11 | Disposition: A | Discharge status: Home / Self Care | Payer: 59 | Source: Ambulatory Visit | Attending: Gynecology | Admitting: Gynecology

## 2016-02-11 DIAGNOSIS — C569 Malignant neoplasm of unspecified ovary: Secondary | ICD-10-CM | POA: Diagnosis not present

## 2016-02-11 LAB — CBC WITH DIFFERENTIAL/PLATELET
Basophils Absolute: 0 10*3/uL (ref 0.0–0.1)
Basophils Relative: 0 %
EOS PCT: 1 %
Eosinophils Absolute: 0 10*3/uL (ref 0.0–0.7)
HEMATOCRIT: 32.2 % — AB (ref 36.0–46.0)
Hemoglobin: 10.6 g/dL — ABNORMAL LOW (ref 12.0–15.0)
LYMPHS PCT: 29 %
Lymphs Abs: 1.4 10*3/uL (ref 0.7–4.0)
MCH: 31.2 pg (ref 26.0–34.0)
MCHC: 32.9 g/dL (ref 30.0–36.0)
MCV: 94.7 fL (ref 78.0–100.0)
MONO ABS: 0.4 10*3/uL (ref 0.1–1.0)
MONOS PCT: 9 %
NEUTROS ABS: 2.9 10*3/uL (ref 1.7–7.7)
Neutrophils Relative %: 61 %
PLATELETS: 323 10*3/uL (ref 150–400)
RBC: 3.4 MIL/uL — ABNORMAL LOW (ref 3.87–5.11)
RDW: 18.5 % — AB (ref 11.5–15.5)
WBC: 4.8 10*3/uL (ref 4.0–10.5)

## 2016-02-14 DIAGNOSIS — C569 Malignant neoplasm of unspecified ovary: Secondary | ICD-10-CM | POA: Diagnosis not present

## 2016-02-15 DIAGNOSIS — D649 Anemia, unspecified: Secondary | ICD-10-CM | POA: Diagnosis not present

## 2016-02-15 DIAGNOSIS — C561 Malignant neoplasm of right ovary: Secondary | ICD-10-CM | POA: Diagnosis not present

## 2016-02-15 DIAGNOSIS — I10 Essential (primary) hypertension: Secondary | ICD-10-CM | POA: Diagnosis not present

## 2016-02-15 DIAGNOSIS — R160 Hepatomegaly, not elsewhere classified: Secondary | ICD-10-CM | POA: Diagnosis not present

## 2016-02-15 DIAGNOSIS — K219 Gastro-esophageal reflux disease without esophagitis: Secondary | ICD-10-CM | POA: Diagnosis not present

## 2016-02-15 DIAGNOSIS — N838 Other noninflammatory disorders of ovary, fallopian tube and broad ligament: Secondary | ICD-10-CM | POA: Diagnosis not present

## 2016-02-15 DIAGNOSIS — E039 Hypothyroidism, unspecified: Secondary | ICD-10-CM | POA: Diagnosis not present

## 2016-02-15 DIAGNOSIS — R1900 Intra-abdominal and pelvic swelling, mass and lump, unspecified site: Secondary | ICD-10-CM | POA: Diagnosis not present

## 2016-02-15 DIAGNOSIS — C772 Secondary and unspecified malignant neoplasm of intra-abdominal lymph nodes: Secondary | ICD-10-CM | POA: Diagnosis not present

## 2016-02-15 DIAGNOSIS — C569 Malignant neoplasm of unspecified ovary: Secondary | ICD-10-CM | POA: Diagnosis not present

## 2016-02-19 ENCOUNTER — Encounter: Payer: Self-pay | Admitting: Radiation Oncology

## 2016-02-28 ENCOUNTER — Ambulatory Visit
Admission: RE | Admit: 2016-02-28 | Discharge: 2016-02-28 | Disposition: A | Discharge status: Home / Self Care | Payer: 59 | Source: Ambulatory Visit | Attending: Radiation Oncology | Admitting: Radiation Oncology

## 2016-02-28 ENCOUNTER — Other Ambulatory Visit: Payer: Self-pay | Admitting: Radiation Oncology

## 2016-02-28 DIAGNOSIS — C801 Malignant (primary) neoplasm, unspecified: Secondary | ICD-10-CM

## 2016-02-29 ENCOUNTER — Encounter: Payer: Self-pay | Admitting: Radiation Oncology

## 2016-03-04 NOTE — Progress Notes (Signed)
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 Persistent periaortic adenopathy following 6 cycles of carboplatin and Taxol chemotherapy.   Kayla Kelley presented with symptoms of: several months history of increasing abdominal girth which she attributed to weight gain.  Biopsies revealed:   09/11/15 A:Ovary and fallopian tube, right, salpingo-oophorectomy - High grade serous carcinoma of the right ovary, size 18.0 cm, without definite surface surface involvement or capsular rupture (stage pT1a) - Lymphovascular space invasion is present, including in the right fallopian tube - See synoptic report and comment  B: Uterus, cervix, left fallopian tube and ovary, hysterectomy and left salpingo-oophorectomy - Extensive lymphovascular space involvement by high grade serous carcinoma, including in the uterus and left ovary, without stromal invasion by carcinoma identified Additional pathologic findings: - Benign cervix with focal calcification - Inactive endometrium and unremarkable myometrium - Left ovary with physiologic changes - Left fallopian tube with small cystic Walthard cell rests and focal calcification  C: Pelvic lymph nodes, right, removal - Two of six lymph nodes positive for metastatic high grade serous carcinoma, size of largest metastasis 0.9 cm, with no extracapsular extension identified  D: Omentum, omentectomy - Fibroadipose tissue with no metastatic carcinoma identified  E: Paraaortic lymph nodes, removal - Lymph node tissue positive for metastatic high grade serous carcinoma, size of nodule 4.9 cm, positive for extracapsular extension (see comment)  Past/Anticipated interventions by Gyn/Onc surgery, if any:  09/11/15 - Resection(Initial) Ovarian/Tubal/Prima Pertonl Malig W/Bil S&O, Omntect; W/Tah/Ltd Para-Aortic Lymphadenect Ureterolysis, With Or Without Repositioning Of Ureter For Retroperitoneal Fibrosis  Past/Anticipated  interventions by medical oncology, if any: 6 cycles of carboplatin and Taxol in December 2017.  Weight changes, if any: no  Bowel/Bladder complaints, if any: no  Nausea/Vomiting, if any: no  Pain issues, if any:  no  SAFETY ISSUES:  Prior radiation? no  Pacemaker/ICD? no  Possible current pregnancy? no  Is the patient on methotrexate? no  Current Complaints / other details:  Recommendation is for radiation to enlarging retroperitoneal lymph nodes.  Patient works in the [employer] at [employer].  BP (!) 143/83 (BP Location: Right Arm, Patient Position: Sitting)   Pulse 88   Temp 98.2 F (36.8 C) (Oral)   Ht 5\' 4"  (1.626 m)   Wt 190 lb (86.2 kg)   LMP 05/05/2010   SpO2 99%   BMI 32.61 kg/m    Wt Readings from Last 3 Encounters:  03/05/16 190 lb (86.2 kg)  08/24/15 206 lb 9.6 oz (93.7 kg)  08/08/15 210 lb 11.2 oz (95.6 kg)

## 2016-03-05 ENCOUNTER — Encounter: Payer: Self-pay | Admitting: Radiation Oncology

## 2016-03-05 ENCOUNTER — Ambulatory Visit: Payer: 59

## 2016-03-05 ENCOUNTER — Ambulatory Visit
Admission: RE | Admit: 2016-03-05 | Discharge: 2016-03-05 | Disposition: A | Discharge status: Home / Self Care | Payer: 59 | Source: Ambulatory Visit | Attending: Radiation Oncology | Admitting: Radiation Oncology

## 2016-03-05 DIAGNOSIS — Z8249 Family history of ischemic heart disease and other diseases of the circulatory system: Secondary | ICD-10-CM | POA: Insufficient documentation

## 2016-03-05 DIAGNOSIS — E663 Overweight: Secondary | ICD-10-CM | POA: Diagnosis not present

## 2016-03-05 DIAGNOSIS — C561 Malignant neoplasm of right ovary: Secondary | ICD-10-CM | POA: Diagnosis not present

## 2016-03-05 DIAGNOSIS — C569 Malignant neoplasm of unspecified ovary: Secondary | ICD-10-CM | POA: Diagnosis not present

## 2016-03-05 DIAGNOSIS — E039 Hypothyroidism, unspecified: Secondary | ICD-10-CM | POA: Diagnosis not present

## 2016-03-05 DIAGNOSIS — Z90722 Acquired absence of ovaries, bilateral: Secondary | ICD-10-CM | POA: Diagnosis not present

## 2016-03-05 DIAGNOSIS — Z90710 Acquired absence of both cervix and uterus: Secondary | ICD-10-CM | POA: Insufficient documentation

## 2016-03-05 DIAGNOSIS — K219 Gastro-esophageal reflux disease without esophagitis: Secondary | ICD-10-CM | POA: Insufficient documentation

## 2016-03-05 DIAGNOSIS — Z510 Encounter for antineoplastic radiation therapy: Secondary | ICD-10-CM | POA: Diagnosis not present

## 2016-03-05 DIAGNOSIS — G4730 Sleep apnea, unspecified: Secondary | ICD-10-CM | POA: Diagnosis not present

## 2016-03-05 DIAGNOSIS — I10 Essential (primary) hypertension: Secondary | ICD-10-CM | POA: Diagnosis not present

## 2016-03-05 DIAGNOSIS — G47419 Narcolepsy without cataplexy: Secondary | ICD-10-CM | POA: Insufficient documentation

## 2016-03-05 DIAGNOSIS — Z9221 Personal history of antineoplastic chemotherapy: Secondary | ICD-10-CM | POA: Diagnosis not present

## 2016-03-05 DIAGNOSIS — Z880 Allergy status to penicillin: Secondary | ICD-10-CM | POA: Insufficient documentation

## 2016-03-05 DIAGNOSIS — Z833 Family history of diabetes mellitus: Secondary | ICD-10-CM | POA: Insufficient documentation

## 2016-03-05 DIAGNOSIS — C778 Secondary and unspecified malignant neoplasm of lymph nodes of multiple regions: Secondary | ICD-10-CM | POA: Diagnosis not present

## 2016-03-05 DIAGNOSIS — Z6832 Body mass index (BMI) 32.0-32.9, adult: Secondary | ICD-10-CM | POA: Insufficient documentation

## 2016-03-05 DIAGNOSIS — Z79899 Other long term (current) drug therapy: Secondary | ICD-10-CM | POA: Diagnosis not present

## 2016-03-05 HISTORY — DX: Malignant neoplasm of unspecified ovary: C56.9

## 2016-03-05 NOTE — Progress Notes (Signed)
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 female who had a history of increasing abdominal girth which she attributed to weight gain for several months. In August 2017, the patient underwent exploratory laparotomy and radical debulking of stage IIIa ovarian cancer. Surgery on 09/11/15 included a total abdominal hysterectomy, bilateral salpingo-oophorectomy, extensive lysis of adhesions and ureteral lysis of the right ureter, right pelvic lymphadenectomy with right pelvic lymphadenectomy, and omentectomy. Surgeries revealed an 18 cm high grade serous carcinoma of the right ovary, lymphovascular space invasion including the right fallopian tube, uterus, and left ovary, two of six pelvic lymph nodes were positive for high grade serous carcinoma, and a 4.9 cm nodule of the para aortic lymph node positive for extracapsular extension. Following surgery, the only residual disease were small lymph nodes at the high periaortic region that were unresectable. She now presents with rising CA-125 (94.7) and evidence of enlarged, persistent retroperitoneal lymph nodes  Patient was treated with 6 cycles of Carboplatin and Taxol starting on 09/27/15 and the last treatment was 01/10/16.  Patient denies weight changes, bowel/bladder complaints, nausea/vomiting, or pain at this time. She denies headaches, dizziness, or double vision at this time. Patient notes neuropathy from chemotherapy is resolving.  PREVIOUS RADIATION THERAPY: No  PAST MEDICAL HISTORY:  has a past medical history of Anemia; Benign essential hypertension; Fatigue; GERD (gastroesophageal reflux  disease); Hidradenitis suppurativa; Hypothyroidism; Lumbosacral strain; Muscle spasm of back; Narcolepsy without cataplexy(347.00); Ovarian cancer ([place]); Ovarian cancer ([place]); Overweight(278.02); PMS (premenstrual syndrome); Routine physical examination; Sciatica; Sleep apnea; Tachycardia; and UTI (urinary tract infection).    PAST SURGICAL HISTORY: Past Surgical History:  Procedure Laterality Date  . BILATERAL SALPINGECTOMY  09/11/2015   At [facility] by Dr. Fermin Schwab - complete procedure: Resection(Initial) Ovarian/Tubal/Prima Pertonl Malig W/Bil S&O, Omntect; W/Tah/Ltd Para-Aortic Lymphadenect    FAMILY HISTORY: family history includes Breast cancer in her paternal aunt; Diabetes in her father, mother, paternal aunt, paternal grandfather, and paternal grandmother; Heart disease (age of onset: 54) in her mother; Lung cancer in her father.  SOCIAL HISTORY:  reports that she has never smoked. She has never used smokeless tobacco. She reports that she does not drink alcohol or use drugs.  ALLERGIES: Ampicillin  MEDICATIONS:  Current Outpatient Prescriptions  Medication Sig Dispense Refill  . acetaminophen (TYLENOL) 325 MG tablet Take 650 mg by mouth.    . ALPRAZolam (XANAX) 0.25 MG tablet Take 1 tablet (0.25 mg total) by mouth 2 (two) times daily as needed for anxiety. 30 tablet 0  . ibuprofen (ADVIL,MOTRIN) 600 MG tablet Take 600 mg by mouth.    . levothyroxine (SYNTHROID, LEVOTHROID) 50 MCG tablet Take 1 tablet (50 mcg total) by mouth daily. 90 tablet 3   No current facility-administered medications for this encounter.     REVIEW OF SYSTEMS:  A 15 point review of systems is documented in the electronic medical record. This was obtained by the nursing staff. However, I reviewed this with the patient to discuss relevant findings and make appropriate changes.    PHYSICAL EXAM:  height is 5\' 4"  (1.626 m) and weight is 190 lb (86.2 kg). Her oral temperature is 98.2 F (36.8 C).  Her blood  pressure is 143/83 (abnormal) and her pulse is 88. Her oxygen saturation is 99%.   General: Alert and oriented, in no acute distress HEENT: Head is normocephalic. Extraocular movements are intact. Oropharynx is clear. Neck: Neck is supple, no palpable cervical or supraclavicular lymphadenopathy. Heart: Regular in rate and rhythm with no murmurs, rubs, or gallops. Chest: Clear to auscultation bilaterally, with no rhonchi, wheezes, or rales. Abdomen: Soft, nontender, nondistended, with no rigidity or guarding. Long vertical scar in abdomen and upper pelvis region from laparotomy.  Extremities: No cyanosis or edema. Lymphatics: see Neck Exam Skin: No concerning lesions. Musculoskeletal: symmetric strength and muscle tone throughout. Neurologic: Cranial nerves II through XII are grossly intact. No obvious focalities. Speech is fluent. Coordination is intact. Psychiatric: Judgment and insight are intact. Affect is appropriate.   ECOG = 1  LABORATORY DATA:  Lab Results  Component Value Date   WBC 4.8 02/11/2016   HGB 10.6 (L) 02/11/2016   HCT 32.2 (L) 02/11/2016   MCV 94.7 02/11/2016   PLT 323 02/11/2016   NEUTROABS 2.9 02/11/2016   Lab Results  Component Value Date   NA 140 02/04/2016   K 3.9 02/04/2016   CL 104 02/04/2016   CO2 26 02/04/2016   GLUCOSE 102 (H) 02/04/2016   CREATININE 0.72 02/04/2016   CALCIUM 9.1 02/04/2016      RADIOGRAPHY: No results found.    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 Patient has completed her chemotherapy. Recent CT imaging appears to demonstrate residual disease in the periaortic/upper iliac region.The patient's CA-125 is also be increasing. In light of these findings, I would agree with Dr. Mora Bellman recommendation of external radiation to the periarotic and  Iliac nodal regions. I discussed the course of treatment, side effects, and potential toxicities with the patient. She appears to understand and wishes to  proceed with treatment. A consent form was signed and a copy was placed in the patient's chart.  PLAN: I will discuss with Dr. Fermin Schwab if PET scan may be of benefit in evaluating the patient prior to her radiation therapy. CT simulation and treatment planning will be scheduled for next week or after PET scan. Anticipate treatment to begin one week following planning with anticipated 5-6 weeks of radiation therapy.  I spent 60 minutes minutes face to face with the patient and more than 50% of that time was spent in counseling and/or coordination of care.   ------------------------------------------------  Blair Promise, PhD, MD  This document serves as a record of services personally performed by Gery Pray, MD. It was created on his behalf by Bethann Humble, a trained medical scribe. The creation of this record is based on the scribe's personal observations and the provider's statements to them. This document has been checked and approved by the attending provider.

## 2016-03-06 NOTE — Addendum Note (Signed)
Encounter addended by: Gery Pray, MD on: 03/06/2016  6:40 PM<BR>    Actions taken: Problem List reviewed, Order list changed, Diagnosis association updated

## 2016-03-06 NOTE — Addendum Note (Signed)
Encounter addended by: Jacqulyn Liner, RN on: 03/06/2016  2:33 PM<BR>    Actions taken: Charge Capture section accepted

## 2016-03-07 ENCOUNTER — Telehealth: Payer: Self-pay | Admitting: Oncology

## 2016-03-07 ENCOUNTER — Telehealth: Payer: Self-pay | Admitting: *Deleted

## 2016-03-07 NOTE — Telephone Encounter (Signed)
Called Kayla Kelley and advised her that Dr. Fermin Schwab has approved her having a PET scan and the Dr. Sondra Come has ordered it.  Also discussed that we would call her once it is scheduled.  Kayla Kelley verbalized understanding and agreement.

## 2016-03-07 NOTE — Telephone Encounter (Signed)
CALLED PATIENT TO INFORM OF PET SCAN ON 03-17-16- ARRIVAL TIME - 1:30 PM @ [facility], PATIENT TO BE NPO - 6 HRS. PRIOR TO TEST, PT. TO HAVE SIPS OF WATER ONLY AFTER 8 AM, TEST TO BE DONE @ [facility], LVM FOR A RETURN CALL0

## 2016-03-14 ENCOUNTER — Ambulatory Visit (HOSPITAL_COMMUNITY): Payer: 59

## 2016-03-17 ENCOUNTER — Ambulatory Visit (HOSPITAL_COMMUNITY): Payer: 59

## 2016-03-19 ENCOUNTER — Encounter (HOSPITAL_COMMUNITY): Payer: Self-pay | Admitting: Radiology

## 2016-03-19 ENCOUNTER — Encounter (HOSPITAL_COMMUNITY)
Admission: RE | Admit: 2016-03-19 | Discharge: 2016-03-19 | Disposition: A | Discharge status: Home / Self Care | Payer: 59 | Source: Ambulatory Visit | Attending: Radiation Oncology | Admitting: Radiation Oncology

## 2016-03-19 DIAGNOSIS — C569 Malignant neoplasm of unspecified ovary: Secondary | ICD-10-CM | POA: Insufficient documentation

## 2016-03-19 LAB — GLUCOSE, CAPILLARY: GLUCOSE-CAPILLARY: 108 mg/dL — AB (ref 65–99)

## 2016-03-19 MED ORDER — FLUDEOXYGLUCOSE F - 18 (FDG) INJECTION: 9.2000 | Freq: Once | INTRAVENOUS | Status: DC | PRN

## 2016-03-25 ENCOUNTER — Telehealth: Payer: Self-pay | Admitting: *Deleted

## 2016-03-25 NOTE — Progress Notes (Signed)
Has armband been applied?  Yes.    Does patient have an allergy to IV contrast dye?: No.   Has patient ever received premedication for IV contrast dye?: No.   Does patient take metformin?: No.  If patient does take metformin when was the last dose: n/a  Date of lab work: March 26, 2016 BUN: 8.1  CR: 0.8   IV site: right chest power port  Has IV site been added to flowsheet?  Yes.      BP (!) 142/83 (BP Location: Left Arm, Patient Position: Sitting)   Pulse 96   Temp 98.3 F (36.8 C) (Oral)   Ht 5\' 4"  (1.626 m)   Wt 187 lb 6.4 oz (85 kg)   LMP 05/05/2010   SpO2 100%   BMI 32.17 kg/m    Wt Readings from Last 3 Encounters:  03/26/16 187 lb 6.4 oz (85 kg)  03/05/16 190 lb (86.2 kg)  08/24/15 206 lb 9.6 oz (93.7 kg)

## 2016-03-25 NOTE — Telephone Encounter (Signed)
CALLED PATIENT TO INFORM OF LAB APPT. FOR 03-26-16 @ 12:15 PM, LVM FOR A RETURN CALL

## 2016-03-26 ENCOUNTER — Ambulatory Visit
Admission: RE | Admit: 2016-03-26 | Discharge: 2016-03-26 | Disposition: A | Discharge status: Home / Self Care | Payer: 59 | Source: Ambulatory Visit | Attending: Radiation Oncology | Admitting: Radiation Oncology

## 2016-03-26 VITALS — BP 142/83 | HR 96 | Temp 98.3°F | Ht 64.0 in | Wt 187.4 lb

## 2016-03-26 DIAGNOSIS — G4730 Sleep apnea, unspecified: Secondary | ICD-10-CM | POA: Diagnosis not present

## 2016-03-26 DIAGNOSIS — K219 Gastro-esophageal reflux disease without esophagitis: Secondary | ICD-10-CM | POA: Diagnosis not present

## 2016-03-26 DIAGNOSIS — C569 Malignant neoplasm of unspecified ovary: Secondary | ICD-10-CM

## 2016-03-26 DIAGNOSIS — E663 Overweight: Secondary | ICD-10-CM | POA: Diagnosis not present

## 2016-03-26 DIAGNOSIS — C561 Malignant neoplasm of right ovary: Secondary | ICD-10-CM | POA: Diagnosis not present

## 2016-03-26 DIAGNOSIS — I10 Essential (primary) hypertension: Secondary | ICD-10-CM | POA: Diagnosis not present

## 2016-03-26 DIAGNOSIS — E039 Hypothyroidism, unspecified: Secondary | ICD-10-CM | POA: Diagnosis not present

## 2016-03-26 DIAGNOSIS — G47419 Narcolepsy without cataplexy: Secondary | ICD-10-CM | POA: Diagnosis not present

## 2016-03-26 DIAGNOSIS — Z510 Encounter for antineoplastic radiation therapy: Secondary | ICD-10-CM | POA: Diagnosis not present

## 2016-03-26 DIAGNOSIS — Z6832 Body mass index (BMI) 32.0-32.9, adult: Secondary | ICD-10-CM | POA: Diagnosis not present

## 2016-03-26 LAB — BUN AND CREATININE (CC13)
BUN: 8.1 mg/dL (ref 7.0–26.0)
Creatinine: 0.8 mg/dL (ref 0.6–1.1)
EGFR: 90 mL/min/{1.73_m2} — ABNORMAL LOW (ref >90)

## 2016-03-26 MED ORDER — LORAZEPAM 0.5 MG PO TABS
0.5000 mg | ORAL_TABLET | Freq: Once | ORAL | Status: AC
  Administered 2016-03-26: 0.5 mg via ORAL
  Filled 2016-03-26: qty 1

## 2016-03-26 MED ORDER — HEPARIN SOD (PORK) LOCK FLUSH 100 UNIT/ML IV SOLN
500.0000 [IU] | Freq: Once | INTRAVENOUS | Status: AC
  Administered 2016-03-26: 500 [IU] via INTRAVENOUS

## 2016-03-26 MED ORDER — SODIUM CHLORIDE 0.9 % IJ SOLN
10.0000 mL | Freq: Once | INTRAMUSCULAR | Status: AC
  Administered 2016-03-26: 10 mL via INTRAVENOUS

## 2016-03-26 NOTE — Progress Notes (Signed)
Port flushed with 10 cc normal saline and heparin.  Deaccessed port.  Applied bandaid.

## 2016-03-31 NOTE — Progress Notes (Signed)
  Radiation Oncology         (336) [phone] ________________________________  Name: Kayla Kelley MRN: [id number]  Date: 03/26/2016  DOB: [birth year]  SIMULATION AND TREATMENT PLANNING NOTE    ICD-9-CM ICD-10-CM   1. 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  Identity was confirmed.  All relevant records and images related to the planned course of therapy were reviewed.  The patient freely provided informed written consent to proceed with treatment after reviewing the details related to the planned course of therapy. The consent form was witnessed and verified by the simulation staff.  Then, the patient was set-up in a stable reproducible  supine position for radiation therapy.  CT images were obtained.  Surface markings were placed.  The CT images were loaded into the planning software.  Then the target and avoidance structures were contoured.  Treatment planning then occurred.  The radiation prescription was entered and confirmed.  Then, I designed and supervised the construction of a total of 2 medically necessary complex treatment devices.  I have requested : Intensity Modulated Radiotherapy (IMRT) is medically necessary for this case for the following reason:  Small bowel sparing..  I have ordered:dose calc.  PLAN:  The patient will receive 45 Gy in 25 fractions with a simultaneous integrated boost to 55 Gy.  -----------------------------------  Blair Promise, PhD, MD

## 2016-04-03 DIAGNOSIS — G4730 Sleep apnea, unspecified: Secondary | ICD-10-CM | POA: Diagnosis not present

## 2016-04-03 DIAGNOSIS — E039 Hypothyroidism, unspecified: Secondary | ICD-10-CM | POA: Diagnosis not present

## 2016-04-03 DIAGNOSIS — C561 Malignant neoplasm of right ovary: Secondary | ICD-10-CM | POA: Diagnosis not present

## 2016-04-03 DIAGNOSIS — E663 Overweight: Secondary | ICD-10-CM | POA: Diagnosis not present

## 2016-04-03 DIAGNOSIS — K219 Gastro-esophageal reflux disease without esophagitis: Secondary | ICD-10-CM | POA: Diagnosis not present

## 2016-04-03 DIAGNOSIS — Z6832 Body mass index (BMI) 32.0-32.9, adult: Secondary | ICD-10-CM | POA: Diagnosis not present

## 2016-04-03 DIAGNOSIS — I10 Essential (primary) hypertension: Secondary | ICD-10-CM | POA: Diagnosis not present

## 2016-04-03 DIAGNOSIS — C569 Malignant neoplasm of unspecified ovary: Secondary | ICD-10-CM | POA: Diagnosis not present

## 2016-04-03 DIAGNOSIS — G47419 Narcolepsy without cataplexy: Secondary | ICD-10-CM | POA: Diagnosis not present

## 2016-04-03 DIAGNOSIS — Z510 Encounter for antineoplastic radiation therapy: Secondary | ICD-10-CM | POA: Diagnosis not present

## 2016-04-07 ENCOUNTER — Ambulatory Visit
Admission: RE | Admit: 2016-04-07 | Discharge: 2016-04-07 | Disposition: A | Discharge status: Home / Self Care | Payer: 59 | Source: Ambulatory Visit | Attending: Radiation Oncology | Admitting: Radiation Oncology

## 2016-04-07 DIAGNOSIS — I10 Essential (primary) hypertension: Secondary | ICD-10-CM | POA: Diagnosis not present

## 2016-04-07 DIAGNOSIS — C569 Malignant neoplasm of unspecified ovary: Secondary | ICD-10-CM | POA: Diagnosis not present

## 2016-04-07 DIAGNOSIS — G47419 Narcolepsy without cataplexy: Secondary | ICD-10-CM | POA: Diagnosis not present

## 2016-04-07 DIAGNOSIS — G4730 Sleep apnea, unspecified: Secondary | ICD-10-CM | POA: Diagnosis not present

## 2016-04-07 DIAGNOSIS — C561 Malignant neoplasm of right ovary: Secondary | ICD-10-CM | POA: Diagnosis not present

## 2016-04-07 DIAGNOSIS — Z6832 Body mass index (BMI) 32.0-32.9, adult: Secondary | ICD-10-CM | POA: Diagnosis not present

## 2016-04-07 DIAGNOSIS — Z510 Encounter for antineoplastic radiation therapy: Secondary | ICD-10-CM | POA: Diagnosis not present

## 2016-04-07 DIAGNOSIS — E039 Hypothyroidism, unspecified: Secondary | ICD-10-CM | POA: Diagnosis not present

## 2016-04-07 DIAGNOSIS — K219 Gastro-esophageal reflux disease without esophagitis: Secondary | ICD-10-CM | POA: Diagnosis not present

## 2016-04-07 DIAGNOSIS — E663 Overweight: Secondary | ICD-10-CM | POA: Diagnosis not present

## 2016-04-08 ENCOUNTER — Ambulatory Visit: Payer: 59 | Admitting: Radiation Oncology

## 2016-04-08 ENCOUNTER — Ambulatory Visit
Admission: RE | Admit: 2016-04-08 | Discharge: 2016-04-08 | Disposition: A | Discharge status: Home / Self Care | Payer: 59 | Source: Ambulatory Visit | Attending: Radiation Oncology | Admitting: Radiation Oncology

## 2016-04-08 ENCOUNTER — Encounter: Payer: Self-pay | Admitting: Radiation Oncology

## 2016-04-08 ENCOUNTER — Inpatient Hospital Stay
Admission: RE | Admit: 2016-04-08 | Discharge: 2016-04-08 | Disposition: A | Discharge status: Home / Self Care | Payer: 59 | Source: Ambulatory Visit | Attending: Radiation Oncology | Admitting: Radiation Oncology

## 2016-04-08 VITALS — BP 143/79 | HR 81 | Temp 98.5°F | Resp 20 | Wt 187.2 lb

## 2016-04-08 DIAGNOSIS — C561 Malignant neoplasm of right ovary: Secondary | ICD-10-CM | POA: Diagnosis not present

## 2016-04-08 DIAGNOSIS — E039 Hypothyroidism, unspecified: Secondary | ICD-10-CM | POA: Diagnosis not present

## 2016-04-08 DIAGNOSIS — Z510 Encounter for antineoplastic radiation therapy: Secondary | ICD-10-CM | POA: Diagnosis not present

## 2016-04-08 DIAGNOSIS — I10 Essential (primary) hypertension: Secondary | ICD-10-CM | POA: Diagnosis not present

## 2016-04-08 DIAGNOSIS — G4730 Sleep apnea, unspecified: Secondary | ICD-10-CM | POA: Diagnosis not present

## 2016-04-08 DIAGNOSIS — K219 Gastro-esophageal reflux disease without esophagitis: Secondary | ICD-10-CM | POA: Diagnosis not present

## 2016-04-08 DIAGNOSIS — C569 Malignant neoplasm of unspecified ovary: Secondary | ICD-10-CM

## 2016-04-08 DIAGNOSIS — Z6832 Body mass index (BMI) 32.0-32.9, adult: Secondary | ICD-10-CM | POA: Diagnosis not present

## 2016-04-08 DIAGNOSIS — E663 Overweight: Secondary | ICD-10-CM | POA: Diagnosis not present

## 2016-04-08 DIAGNOSIS — G47419 Narcolepsy without cataplexy: Secondary | ICD-10-CM | POA: Diagnosis not present

## 2016-04-08 MED ORDER — RADIAPLEXRX EX GEL
Freq: Once | CUTANEOUS | Status: AC
  Administered 2016-04-08: 11:00:00 via TOPICAL

## 2016-04-08 NOTE — Progress Notes (Signed)
Pt here for patient teaching.  Pt given Radiation and You booklet and Radiaplex gel.  Reviewed areas of pertinence such as diarrhea, fatigue, nausea and vomiting and skin changes . Pt able to give teach back of to pat skin, use unscented/gentle soap, use baby wipes, have Imodium on hand, drink plenty of water and sitz bath,apply Radiaplex bid and avoid applying anything to skin within 4 hours of treatment. Pt demonstrated understanding and verbalizes understanding of information given and will contact nursing with any questions or concerns.

## 2016-04-08 NOTE — Progress Notes (Signed)
Kayla Kelley completed 2nd fraction to para arotics.  Denies any pain and fatigue.  Does report a high level of anxiety but so far has not taken anything to help with it.  No skin irritation.    Education completed with patient by Elmo Putt RN   Vitals:   04/08/16 1001  BP: (!) 143/79  Pulse: 81  Resp: 20  Temp: 98.5 F (36.9 C)  TempSrc: Oral  SpO2: 100%  Weight: 187 lb 3.2 oz (84.9 kg)    Wt Readings from Last 3 Encounters:  04/08/16 187 lb 3.2 oz (84.9 kg)  03/26/16 187 lb 6.4 oz (85 kg)  03/05/16 190 lb (86.2 kg)

## 2016-04-08 NOTE — Progress Notes (Signed)
  Radiation Oncology         (336) [phone] ________________________________  Name: Kayla Kelley MRN: [id number]  Date: 04/08/2016  DOB: February 19, 1965  Weekly Radiation Therapy Management    ICD-9-CM ICD-10-CM   1. Epithelial ovarian cancer, FIGO stage IIIA (HCC) 183.0 C56.9      Current Dose: 4.4 Gy     Planned Dose:  55 Gy  Narrative . . . . . . . . The patient presents for routine under treatment assessment.                                    The patient completed her 2nd fraction to the para-aortic And left pelvic nodal areas. Denies any pain or nausea. Does report a high level of anxiety, but so far has not taken anything to help with it. The patient has some fatigue. The nurse noted no skin irritation. Education completed with patient by Elmo Putt, RN.                                  Set-up films were reviewed.                                 The chart was checked. Physical Findings. . .  weight is 187 lb 3.2 oz (84.9 kg). Her oral temperature is 98.5 F (36.9 C). Her blood pressure is 143/79 (abnormal) and her pulse is 81. Her respiration is 20 and oxygen saturation is 100%.   Lungs are clear to auscultation bilaterally. Heart has regular rate and rhythm. Abdomen soft, non-tender, normal bowel sounds. Impression . . . . . . . The patient is tolerating radiation. Plan . . . . . . . . . . . . Continue treatment as planned. ________________________________   Blair Promise, PhD, MD  This document serves as a record of services personally performed by Gery Pray, MD. It was created on his behalf by Darcus Austin, a trained medical scribe. The creation of this record is based on the scribe's personal observations and the provider's statements to them. This document has been checked and approved by the attending provider.

## 2016-04-09 ENCOUNTER — Ambulatory Visit
Admission: RE | Admit: 2016-04-09 | Discharge: 2016-04-09 | Disposition: A | Discharge status: Home / Self Care | Payer: 59 | Source: Ambulatory Visit | Attending: Radiation Oncology | Admitting: Radiation Oncology

## 2016-04-09 DIAGNOSIS — E039 Hypothyroidism, unspecified: Secondary | ICD-10-CM | POA: Diagnosis not present

## 2016-04-09 DIAGNOSIS — G4730 Sleep apnea, unspecified: Secondary | ICD-10-CM | POA: Diagnosis not present

## 2016-04-09 DIAGNOSIS — E663 Overweight: Secondary | ICD-10-CM | POA: Diagnosis not present

## 2016-04-09 DIAGNOSIS — Z510 Encounter for antineoplastic radiation therapy: Secondary | ICD-10-CM | POA: Diagnosis not present

## 2016-04-09 DIAGNOSIS — C569 Malignant neoplasm of unspecified ovary: Secondary | ICD-10-CM | POA: Diagnosis not present

## 2016-04-09 DIAGNOSIS — Z6832 Body mass index (BMI) 32.0-32.9, adult: Secondary | ICD-10-CM | POA: Diagnosis not present

## 2016-04-09 DIAGNOSIS — K219 Gastro-esophageal reflux disease without esophagitis: Secondary | ICD-10-CM | POA: Diagnosis not present

## 2016-04-09 DIAGNOSIS — I10 Essential (primary) hypertension: Secondary | ICD-10-CM | POA: Diagnosis not present

## 2016-04-09 DIAGNOSIS — G47419 Narcolepsy without cataplexy: Secondary | ICD-10-CM | POA: Diagnosis not present

## 2016-04-09 DIAGNOSIS — C561 Malignant neoplasm of right ovary: Secondary | ICD-10-CM | POA: Diagnosis not present

## 2016-04-10 ENCOUNTER — Ambulatory Visit
Admission: RE | Admit: 2016-04-10 | Discharge: 2016-04-10 | Disposition: A | Discharge status: Home / Self Care | Payer: 59 | Source: Ambulatory Visit | Attending: Radiation Oncology | Admitting: Radiation Oncology

## 2016-04-10 ENCOUNTER — Encounter: Payer: Self-pay | Admitting: Oncology

## 2016-04-10 ENCOUNTER — Encounter: Payer: Self-pay | Admitting: Radiation Oncology

## 2016-04-10 DIAGNOSIS — Z510 Encounter for antineoplastic radiation therapy: Secondary | ICD-10-CM | POA: Diagnosis not present

## 2016-04-10 DIAGNOSIS — K219 Gastro-esophageal reflux disease without esophagitis: Secondary | ICD-10-CM | POA: Diagnosis not present

## 2016-04-10 DIAGNOSIS — G4730 Sleep apnea, unspecified: Secondary | ICD-10-CM | POA: Diagnosis not present

## 2016-04-10 DIAGNOSIS — C561 Malignant neoplasm of right ovary: Secondary | ICD-10-CM | POA: Diagnosis not present

## 2016-04-10 DIAGNOSIS — Z6832 Body mass index (BMI) 32.0-32.9, adult: Secondary | ICD-10-CM | POA: Diagnosis not present

## 2016-04-10 DIAGNOSIS — E663 Overweight: Secondary | ICD-10-CM | POA: Diagnosis not present

## 2016-04-10 DIAGNOSIS — C569 Malignant neoplasm of unspecified ovary: Secondary | ICD-10-CM | POA: Diagnosis not present

## 2016-04-10 DIAGNOSIS — G47419 Narcolepsy without cataplexy: Secondary | ICD-10-CM | POA: Diagnosis not present

## 2016-04-10 DIAGNOSIS — E039 Hypothyroidism, unspecified: Secondary | ICD-10-CM | POA: Diagnosis not present

## 2016-04-10 DIAGNOSIS — I10 Essential (primary) hypertension: Secondary | ICD-10-CM | POA: Diagnosis not present

## 2016-04-11 ENCOUNTER — Ambulatory Visit
Admission: RE | Admit: 2016-04-11 | Discharge: 2016-04-11 | Disposition: A | Discharge status: Home / Self Care | Payer: 59 | Source: Ambulatory Visit | Attending: Radiation Oncology | Admitting: Radiation Oncology

## 2016-04-11 DIAGNOSIS — E663 Overweight: Secondary | ICD-10-CM | POA: Diagnosis not present

## 2016-04-11 DIAGNOSIS — I10 Essential (primary) hypertension: Secondary | ICD-10-CM | POA: Diagnosis not present

## 2016-04-11 DIAGNOSIS — Z6832 Body mass index (BMI) 32.0-32.9, adult: Secondary | ICD-10-CM | POA: Diagnosis not present

## 2016-04-11 DIAGNOSIS — C569 Malignant neoplasm of unspecified ovary: Secondary | ICD-10-CM | POA: Diagnosis not present

## 2016-04-11 DIAGNOSIS — K219 Gastro-esophageal reflux disease without esophagitis: Secondary | ICD-10-CM | POA: Diagnosis not present

## 2016-04-11 DIAGNOSIS — E039 Hypothyroidism, unspecified: Secondary | ICD-10-CM | POA: Diagnosis not present

## 2016-04-11 DIAGNOSIS — G4730 Sleep apnea, unspecified: Secondary | ICD-10-CM | POA: Diagnosis not present

## 2016-04-11 DIAGNOSIS — G47419 Narcolepsy without cataplexy: Secondary | ICD-10-CM | POA: Diagnosis not present

## 2016-04-11 DIAGNOSIS — C561 Malignant neoplasm of right ovary: Secondary | ICD-10-CM | POA: Diagnosis not present

## 2016-04-11 DIAGNOSIS — Z510 Encounter for antineoplastic radiation therapy: Secondary | ICD-10-CM | POA: Diagnosis not present

## 2016-04-11 NOTE — Progress Notes (Signed)
Patient reports she is feeling better today.  She thinks the fatigue and aching she had yesterday was due to working the previous night.  She had a slightly elevated temp on arrival of 99.2.  When rechecked, after treatment, her temp was 98.2.  Advised her to check her temperature over the weekend and to call the doctor on call if it is over 101 degrees.

## 2016-04-13 ENCOUNTER — Encounter: Payer: Self-pay | Admitting: Family Medicine

## 2016-04-14 ENCOUNTER — Ambulatory Visit
Admission: RE | Admit: 2016-04-14 | Discharge: 2016-04-14 | Disposition: A | Discharge status: Home / Self Care | Payer: 59 | Source: Ambulatory Visit | Attending: Radiation Oncology | Admitting: Radiation Oncology

## 2016-04-14 ENCOUNTER — Other Ambulatory Visit: Payer: Self-pay | Admitting: *Deleted

## 2016-04-14 ENCOUNTER — Encounter: Payer: Self-pay | Admitting: *Deleted

## 2016-04-14 DIAGNOSIS — C561 Malignant neoplasm of right ovary: Secondary | ICD-10-CM | POA: Diagnosis not present

## 2016-04-14 DIAGNOSIS — G4730 Sleep apnea, unspecified: Secondary | ICD-10-CM | POA: Diagnosis not present

## 2016-04-14 DIAGNOSIS — G47419 Narcolepsy without cataplexy: Secondary | ICD-10-CM | POA: Diagnosis not present

## 2016-04-14 DIAGNOSIS — Z510 Encounter for antineoplastic radiation therapy: Secondary | ICD-10-CM | POA: Diagnosis not present

## 2016-04-14 DIAGNOSIS — E039 Hypothyroidism, unspecified: Secondary | ICD-10-CM | POA: Diagnosis not present

## 2016-04-14 DIAGNOSIS — K219 Gastro-esophageal reflux disease without esophagitis: Secondary | ICD-10-CM | POA: Diagnosis not present

## 2016-04-14 DIAGNOSIS — E663 Overweight: Secondary | ICD-10-CM | POA: Diagnosis not present

## 2016-04-14 DIAGNOSIS — I10 Essential (primary) hypertension: Secondary | ICD-10-CM | POA: Diagnosis not present

## 2016-04-14 DIAGNOSIS — Z6832 Body mass index (BMI) 32.0-32.9, adult: Secondary | ICD-10-CM | POA: Diagnosis not present

## 2016-04-14 DIAGNOSIS — C569 Malignant neoplasm of unspecified ovary: Secondary | ICD-10-CM | POA: Diagnosis not present

## 2016-04-14 MED ORDER — LEVOTHYROXINE SODIUM 50 MCG PO TABS: 50.0000 ug | ORAL_TABLET | Freq: Every day | ORAL | 3 refills | Status: DC

## 2016-04-14 MED FILL — LEVOTHYROXINE 50 MCG TABLET: 50 | 90 days supply | Qty: 90 | Fill #0

## 2016-04-15 ENCOUNTER — Ambulatory Visit
Admission: RE | Admit: 2016-04-15 | Discharge: 2016-04-15 | Disposition: A | Discharge status: Home / Self Care | Payer: 59 | Source: Ambulatory Visit | Attending: Radiation Oncology | Admitting: Radiation Oncology

## 2016-04-15 ENCOUNTER — Encounter: Payer: Self-pay | Admitting: Radiation Oncology

## 2016-04-15 VITALS — BP 126/79 | HR 100 | Temp 98.4°F | Resp 18 | Ht 64.0 in | Wt 187.4 lb

## 2016-04-15 DIAGNOSIS — E039 Hypothyroidism, unspecified: Secondary | ICD-10-CM | POA: Diagnosis not present

## 2016-04-15 DIAGNOSIS — C561 Malignant neoplasm of right ovary: Secondary | ICD-10-CM | POA: Diagnosis not present

## 2016-04-15 DIAGNOSIS — G4730 Sleep apnea, unspecified: Secondary | ICD-10-CM | POA: Diagnosis not present

## 2016-04-15 DIAGNOSIS — Z510 Encounter for antineoplastic radiation therapy: Secondary | ICD-10-CM | POA: Diagnosis not present

## 2016-04-15 DIAGNOSIS — I10 Essential (primary) hypertension: Secondary | ICD-10-CM | POA: Diagnosis not present

## 2016-04-15 DIAGNOSIS — E663 Overweight: Secondary | ICD-10-CM | POA: Diagnosis not present

## 2016-04-15 DIAGNOSIS — C569 Malignant neoplasm of unspecified ovary: Secondary | ICD-10-CM

## 2016-04-15 DIAGNOSIS — K219 Gastro-esophageal reflux disease without esophagitis: Secondary | ICD-10-CM | POA: Diagnosis not present

## 2016-04-15 DIAGNOSIS — Z6832 Body mass index (BMI) 32.0-32.9, adult: Secondary | ICD-10-CM | POA: Diagnosis not present

## 2016-04-15 DIAGNOSIS — G47419 Narcolepsy without cataplexy: Secondary | ICD-10-CM | POA: Diagnosis not present

## 2016-04-15 NOTE — Progress Notes (Signed)
  Radiation Oncology         (336) [phone] ________________________________  Name: Kayla Kelley MRN: [id number]  Date: 04/15/2016  DOB: 01-17-1966  Weekly Radiation Therapy Management    ICD-9-CM ICD-10-CM   1. Epithelial ovarian cancer, FIGO stage IIIA (HCC) 183.0 C56.9      Current Dose: 15.4 Gy     Planned Dose:  55 Gy  Narrative . . . . . . . . The patient presents for routine under treatment assessment.                                    The patient has completed 7 fractions to the para-aortic and left pelvic nodal areas. She denies pain. The patient reports fatigue. She reports a lower anxiety level recently. Her appetite is improved and she is eating more regularly. She denies nausea or diarrhea. The patient is using Radiaplex as directed. The patient reports she is working 2 days a week at this time.                                  Set-up films were reviewed.                                 The chart was checked. Physical Findings. . .  height is 5\' 4"  (1.626 m) and weight is 187 lb 6.4 oz (85 kg). Her oral temperature is 98.4 F (36.9 C). Her blood pressure is 126/79 and her pulse is 100. Her respiration is 18 and oxygen saturation is 100%.   Lungs are clear to auscultation bilaterally. Heart has regular rate and rhythm. Abdomen soft, non-tender, normal bowel sounds.  Impression . . . . . . . The patient is tolerating radiation. Plan . . . . . . . . . . . . Continue treatment as planned. ________________________________   Blair Promise, PhD, MD  This document serves as a record of services personally performed by Gery Pray, MD. It was created on his behalf by Maryla Morrow, a trained medical scribe. The creation of this record is based on the scribe's personal observations and the provider's statements to them. This document has been checked and approved by the attending provider.

## 2016-04-15 NOTE — Progress Notes (Signed)
Kayla Kelley completed 7th fraction to para arotics.  Denies any pain.  Reports  fatigue.  Rports having a lower anxiety level.  No skin irritation, using Radiaplex gel bid.  Appetite is much better eating regular. Wt Readings from Last 3 Encounters:  04/15/16 187 lb 6.4 oz (85 kg)  04/08/16 187 lb 3.2 oz (84.9 kg)  03/26/16 187 lb 6.4 oz (85 kg)  BP 126/79   Pulse 100   Temp 98.4 F (36.9 C) (Oral)   Resp 18   Ht 5\' 4"  (1.626 m)   Wt 187 lb 6.4 oz (85 kg)   LMP 05/05/2010   SpO2 100%   BMI 32.17 kg/m

## 2016-04-16 ENCOUNTER — Ambulatory Visit
Admission: RE | Admit: 2016-04-16 | Discharge: 2016-04-16 | Disposition: A | Discharge status: Home / Self Care | Payer: 59 | Source: Ambulatory Visit | Attending: Radiation Oncology | Admitting: Radiation Oncology

## 2016-04-16 DIAGNOSIS — G47419 Narcolepsy without cataplexy: Secondary | ICD-10-CM | POA: Diagnosis not present

## 2016-04-16 DIAGNOSIS — K219 Gastro-esophageal reflux disease without esophagitis: Secondary | ICD-10-CM | POA: Diagnosis not present

## 2016-04-16 DIAGNOSIS — Z6832 Body mass index (BMI) 32.0-32.9, adult: Secondary | ICD-10-CM | POA: Diagnosis not present

## 2016-04-16 DIAGNOSIS — C561 Malignant neoplasm of right ovary: Secondary | ICD-10-CM | POA: Diagnosis not present

## 2016-04-16 DIAGNOSIS — E663 Overweight: Secondary | ICD-10-CM | POA: Diagnosis not present

## 2016-04-16 DIAGNOSIS — G4730 Sleep apnea, unspecified: Secondary | ICD-10-CM | POA: Diagnosis not present

## 2016-04-16 DIAGNOSIS — I10 Essential (primary) hypertension: Secondary | ICD-10-CM | POA: Diagnosis not present

## 2016-04-16 DIAGNOSIS — Z510 Encounter for antineoplastic radiation therapy: Secondary | ICD-10-CM | POA: Diagnosis not present

## 2016-04-16 DIAGNOSIS — E039 Hypothyroidism, unspecified: Secondary | ICD-10-CM | POA: Diagnosis not present

## 2016-04-16 DIAGNOSIS — C569 Malignant neoplasm of unspecified ovary: Secondary | ICD-10-CM | POA: Diagnosis not present

## 2016-04-17 ENCOUNTER — Ambulatory Visit
Admission: RE | Admit: 2016-04-17 | Discharge: 2016-04-17 | Disposition: A | Discharge status: Home / Self Care | Payer: 59 | Source: Ambulatory Visit | Attending: Radiation Oncology | Admitting: Radiation Oncology

## 2016-04-17 DIAGNOSIS — Z6832 Body mass index (BMI) 32.0-32.9, adult: Secondary | ICD-10-CM | POA: Diagnosis not present

## 2016-04-17 DIAGNOSIS — I10 Essential (primary) hypertension: Secondary | ICD-10-CM | POA: Diagnosis not present

## 2016-04-17 DIAGNOSIS — G47419 Narcolepsy without cataplexy: Secondary | ICD-10-CM | POA: Diagnosis not present

## 2016-04-17 DIAGNOSIS — G4730 Sleep apnea, unspecified: Secondary | ICD-10-CM | POA: Diagnosis not present

## 2016-04-17 DIAGNOSIS — C569 Malignant neoplasm of unspecified ovary: Secondary | ICD-10-CM | POA: Diagnosis not present

## 2016-04-17 DIAGNOSIS — C561 Malignant neoplasm of right ovary: Secondary | ICD-10-CM | POA: Diagnosis not present

## 2016-04-17 DIAGNOSIS — Z510 Encounter for antineoplastic radiation therapy: Secondary | ICD-10-CM | POA: Diagnosis not present

## 2016-04-17 DIAGNOSIS — K219 Gastro-esophageal reflux disease without esophagitis: Secondary | ICD-10-CM | POA: Diagnosis not present

## 2016-04-17 DIAGNOSIS — E039 Hypothyroidism, unspecified: Secondary | ICD-10-CM | POA: Diagnosis not present

## 2016-04-17 DIAGNOSIS — E663 Overweight: Secondary | ICD-10-CM | POA: Diagnosis not present

## 2016-04-18 ENCOUNTER — Ambulatory Visit
Admission: RE | Admit: 2016-04-18 | Discharge: 2016-04-18 | Disposition: A | Discharge status: Home / Self Care | Payer: 59 | Source: Ambulatory Visit | Attending: Radiation Oncology | Admitting: Radiation Oncology

## 2016-04-18 DIAGNOSIS — K219 Gastro-esophageal reflux disease without esophagitis: Secondary | ICD-10-CM | POA: Diagnosis not present

## 2016-04-18 DIAGNOSIS — G4730 Sleep apnea, unspecified: Secondary | ICD-10-CM | POA: Diagnosis not present

## 2016-04-18 DIAGNOSIS — C561 Malignant neoplasm of right ovary: Secondary | ICD-10-CM | POA: Diagnosis not present

## 2016-04-18 DIAGNOSIS — E663 Overweight: Secondary | ICD-10-CM | POA: Diagnosis not present

## 2016-04-18 DIAGNOSIS — E039 Hypothyroidism, unspecified: Secondary | ICD-10-CM | POA: Diagnosis not present

## 2016-04-18 DIAGNOSIS — I10 Essential (primary) hypertension: Secondary | ICD-10-CM | POA: Diagnosis not present

## 2016-04-18 DIAGNOSIS — G47419 Narcolepsy without cataplexy: Secondary | ICD-10-CM | POA: Diagnosis not present

## 2016-04-18 DIAGNOSIS — Z6832 Body mass index (BMI) 32.0-32.9, adult: Secondary | ICD-10-CM | POA: Diagnosis not present

## 2016-04-18 DIAGNOSIS — Z510 Encounter for antineoplastic radiation therapy: Secondary | ICD-10-CM | POA: Diagnosis not present

## 2016-04-18 DIAGNOSIS — C569 Malignant neoplasm of unspecified ovary: Secondary | ICD-10-CM | POA: Diagnosis not present

## 2016-04-21 ENCOUNTER — Ambulatory Visit
Admission: RE | Admit: 2016-04-21 | Discharge: 2016-04-21 | Disposition: A | Discharge status: Home / Self Care | Payer: 59 | Source: Ambulatory Visit | Attending: Radiation Oncology | Admitting: Radiation Oncology

## 2016-04-21 DIAGNOSIS — C561 Malignant neoplasm of right ovary: Secondary | ICD-10-CM | POA: Diagnosis not present

## 2016-04-21 DIAGNOSIS — G47419 Narcolepsy without cataplexy: Secondary | ICD-10-CM | POA: Diagnosis not present

## 2016-04-21 DIAGNOSIS — G4730 Sleep apnea, unspecified: Secondary | ICD-10-CM | POA: Diagnosis not present

## 2016-04-21 DIAGNOSIS — E039 Hypothyroidism, unspecified: Secondary | ICD-10-CM | POA: Diagnosis not present

## 2016-04-21 DIAGNOSIS — Z6832 Body mass index (BMI) 32.0-32.9, adult: Secondary | ICD-10-CM | POA: Diagnosis not present

## 2016-04-21 DIAGNOSIS — Z510 Encounter for antineoplastic radiation therapy: Secondary | ICD-10-CM | POA: Diagnosis not present

## 2016-04-21 DIAGNOSIS — I10 Essential (primary) hypertension: Secondary | ICD-10-CM | POA: Diagnosis not present

## 2016-04-21 DIAGNOSIS — C569 Malignant neoplasm of unspecified ovary: Secondary | ICD-10-CM | POA: Diagnosis not present

## 2016-04-21 DIAGNOSIS — E663 Overweight: Secondary | ICD-10-CM | POA: Diagnosis not present

## 2016-04-21 DIAGNOSIS — K219 Gastro-esophageal reflux disease without esophagitis: Secondary | ICD-10-CM | POA: Diagnosis not present

## 2016-04-22 ENCOUNTER — Encounter: Payer: Self-pay | Admitting: Radiation Oncology

## 2016-04-22 ENCOUNTER — Ambulatory Visit
Admission: RE | Admit: 2016-04-22 | Discharge: 2016-04-22 | Disposition: A | Discharge status: Home / Self Care | Payer: 59 | Source: Ambulatory Visit | Attending: Radiation Oncology | Admitting: Radiation Oncology

## 2016-04-22 VITALS — BP 130/74 | HR 92 | Temp 98.4°F | Ht 64.0 in | Wt 187.6 lb

## 2016-04-22 DIAGNOSIS — Z6832 Body mass index (BMI) 32.0-32.9, adult: Secondary | ICD-10-CM | POA: Diagnosis not present

## 2016-04-22 DIAGNOSIS — C569 Malignant neoplasm of unspecified ovary: Secondary | ICD-10-CM | POA: Diagnosis not present

## 2016-04-22 DIAGNOSIS — I10 Essential (primary) hypertension: Secondary | ICD-10-CM | POA: Diagnosis not present

## 2016-04-22 DIAGNOSIS — C561 Malignant neoplasm of right ovary: Secondary | ICD-10-CM | POA: Diagnosis not present

## 2016-04-22 DIAGNOSIS — E039 Hypothyroidism, unspecified: Secondary | ICD-10-CM | POA: Diagnosis not present

## 2016-04-22 DIAGNOSIS — G47419 Narcolepsy without cataplexy: Secondary | ICD-10-CM | POA: Diagnosis not present

## 2016-04-22 DIAGNOSIS — K219 Gastro-esophageal reflux disease without esophagitis: Secondary | ICD-10-CM | POA: Diagnosis not present

## 2016-04-22 DIAGNOSIS — G4730 Sleep apnea, unspecified: Secondary | ICD-10-CM | POA: Diagnosis not present

## 2016-04-22 DIAGNOSIS — Z510 Encounter for antineoplastic radiation therapy: Secondary | ICD-10-CM | POA: Diagnosis not present

## 2016-04-22 DIAGNOSIS — E663 Overweight: Secondary | ICD-10-CM | POA: Diagnosis not present

## 2016-04-22 NOTE — Progress Notes (Signed)
  Radiation Oncology         [phone]) [phone] ________________________________  Name: Kayla Kelley MRN: [id number]  Date: 04/22/2016  DOB: [birth year]  Weekly Radiation Therapy Management    ICD-9-CM ICD-10-CM   1. Epithelial ovarian cancer, FIGO stage IIIA (HCC) 183.0 C56.9      Current Dose: 26.4 Gy      Planned Dose:  55 Gy  Narrative . . . . . . . . The patient presents for routine under treatment assessment.                                    The patient has completed 12 fractions to the para-aortic and left pelvic nodal areas. She denies pain. She reports some fatigue, and questions if she should take some time off. She reports an ongoing cold with runny nose and cough. The patient denies nausea, though she reports some appetite change. She describes craving bread and foregoing salads which she used to frequently eat. She reports diarrhea 1-2 x daily, and has not taken Imodium for this.                                  Set-up films were reviewed.                                 The chart was checked. Physical Findings. . .  height is 5\' 4"  (1.626 m) and weight is 187 lb 9.6 oz (85.1 kg). Her oral temperature is 98.4 F (36.9 C). Her blood pressure is 130/74 and her pulse is 92. Her oxygen saturation is 100%.   Lungs are clear to auscultation bilaterally. Heart has regular rate and rhythm. Abdomen soft, non tender, normal bowel sounds. Impression . . . . . . . The patient is tolerating radiation. Plan . . . . . . . . . . . . Continue treatment as planned. The patient will start taking Imodium as needed. I advised the patient that she could reduce her hours at work if needed due to fatigue. I will be happy to provide documentation as needed for her employer.  ________________________________   Blair Promise, PhD, MD  This document serves as a record of services personally performed by Gery Pray, MD. It was created on his behalf by Maryla Morrow, a trained medical scribe. The  creation of this record is based on the scribe's personal observations and the provider's statements to them. This document has been checked and approved by the attending provider.

## 2016-04-22 NOTE — Progress Notes (Signed)
Sahaana Weitman has completed 12 fractions to her periaortic's.  She denies having pain or nausea.  She reports her appetite has changed.  She reports having diarrhea 1-2 times per day.  She has not started taking Imodium.  She reports having a cold with a runny nose and cough.  She reports feeling fatigued.  BP 130/74 (BP Location: Right Arm, Patient Position: Sitting)   Pulse 92   Temp 98.4 F (36.9 C) (Oral)   Ht 5\' 4"  (1.626 m)   Wt 187 lb 9.6 oz (85.1 kg)   LMP 05/05/2010   SpO2 100%   BMI 32.20 kg/m    Wt Readings from Last 3 Encounters:  04/22/16 187 lb 9.6 oz (85.1 kg)  04/15/16 187 lb 6.4 oz (85 kg)  04/08/16 187 lb 3.2 oz (84.9 kg)

## 2016-04-23 ENCOUNTER — Ambulatory Visit: Payer: 59

## 2016-04-24 ENCOUNTER — Ambulatory Visit
Admission: RE | Admit: 2016-04-24 | Discharge: 2016-04-24 | Disposition: A | Discharge status: Home / Self Care | Payer: 59 | Source: Ambulatory Visit | Attending: Radiation Oncology | Admitting: Radiation Oncology

## 2016-04-24 DIAGNOSIS — E663 Overweight: Secondary | ICD-10-CM | POA: Diagnosis not present

## 2016-04-24 DIAGNOSIS — Z6832 Body mass index (BMI) 32.0-32.9, adult: Secondary | ICD-10-CM | POA: Diagnosis not present

## 2016-04-24 DIAGNOSIS — G4730 Sleep apnea, unspecified: Secondary | ICD-10-CM | POA: Diagnosis not present

## 2016-04-24 DIAGNOSIS — I10 Essential (primary) hypertension: Secondary | ICD-10-CM | POA: Diagnosis not present

## 2016-04-24 DIAGNOSIS — C561 Malignant neoplasm of right ovary: Secondary | ICD-10-CM | POA: Diagnosis not present

## 2016-04-24 DIAGNOSIS — G47419 Narcolepsy without cataplexy: Secondary | ICD-10-CM | POA: Diagnosis not present

## 2016-04-24 DIAGNOSIS — C569 Malignant neoplasm of unspecified ovary: Secondary | ICD-10-CM | POA: Diagnosis not present

## 2016-04-24 DIAGNOSIS — K219 Gastro-esophageal reflux disease without esophagitis: Secondary | ICD-10-CM | POA: Diagnosis not present

## 2016-04-24 DIAGNOSIS — Z510 Encounter for antineoplastic radiation therapy: Secondary | ICD-10-CM | POA: Diagnosis not present

## 2016-04-24 DIAGNOSIS — E039 Hypothyroidism, unspecified: Secondary | ICD-10-CM | POA: Diagnosis not present

## 2016-04-25 ENCOUNTER — Ambulatory Visit
Admission: RE | Admit: 2016-04-25 | Discharge: 2016-04-25 | Disposition: A | Discharge status: Home / Self Care | Payer: 59 | Source: Ambulatory Visit | Attending: Radiation Oncology | Admitting: Radiation Oncology

## 2016-04-25 DIAGNOSIS — G47419 Narcolepsy without cataplexy: Secondary | ICD-10-CM | POA: Diagnosis not present

## 2016-04-25 DIAGNOSIS — I10 Essential (primary) hypertension: Secondary | ICD-10-CM | POA: Diagnosis not present

## 2016-04-25 DIAGNOSIS — G4730 Sleep apnea, unspecified: Secondary | ICD-10-CM | POA: Diagnosis not present

## 2016-04-25 DIAGNOSIS — K219 Gastro-esophageal reflux disease without esophagitis: Secondary | ICD-10-CM | POA: Diagnosis not present

## 2016-04-25 DIAGNOSIS — E663 Overweight: Secondary | ICD-10-CM | POA: Diagnosis not present

## 2016-04-25 DIAGNOSIS — C569 Malignant neoplasm of unspecified ovary: Secondary | ICD-10-CM | POA: Diagnosis not present

## 2016-04-25 DIAGNOSIS — Z6832 Body mass index (BMI) 32.0-32.9, adult: Secondary | ICD-10-CM | POA: Diagnosis not present

## 2016-04-25 DIAGNOSIS — Z510 Encounter for antineoplastic radiation therapy: Secondary | ICD-10-CM | POA: Diagnosis not present

## 2016-04-25 DIAGNOSIS — E039 Hypothyroidism, unspecified: Secondary | ICD-10-CM | POA: Diagnosis not present

## 2016-04-25 DIAGNOSIS — C561 Malignant neoplasm of right ovary: Secondary | ICD-10-CM | POA: Diagnosis not present

## 2016-04-28 ENCOUNTER — Ambulatory Visit
Admission: RE | Admit: 2016-04-28 | Discharge: 2016-04-28 | Disposition: A | Discharge status: Home / Self Care | Payer: 59 | Source: Ambulatory Visit | Attending: Radiation Oncology | Admitting: Radiation Oncology

## 2016-04-28 DIAGNOSIS — K219 Gastro-esophageal reflux disease without esophagitis: Secondary | ICD-10-CM | POA: Diagnosis not present

## 2016-04-28 DIAGNOSIS — Z6832 Body mass index (BMI) 32.0-32.9, adult: Secondary | ICD-10-CM | POA: Diagnosis not present

## 2016-04-28 DIAGNOSIS — I10 Essential (primary) hypertension: Secondary | ICD-10-CM | POA: Diagnosis not present

## 2016-04-28 DIAGNOSIS — C561 Malignant neoplasm of right ovary: Secondary | ICD-10-CM | POA: Diagnosis not present

## 2016-04-28 DIAGNOSIS — G47419 Narcolepsy without cataplexy: Secondary | ICD-10-CM | POA: Diagnosis not present

## 2016-04-28 DIAGNOSIS — Z510 Encounter for antineoplastic radiation therapy: Secondary | ICD-10-CM | POA: Diagnosis not present

## 2016-04-28 DIAGNOSIS — E663 Overweight: Secondary | ICD-10-CM | POA: Diagnosis not present

## 2016-04-28 DIAGNOSIS — C569 Malignant neoplasm of unspecified ovary: Secondary | ICD-10-CM | POA: Diagnosis not present

## 2016-04-28 DIAGNOSIS — G4730 Sleep apnea, unspecified: Secondary | ICD-10-CM | POA: Diagnosis not present

## 2016-04-28 DIAGNOSIS — E039 Hypothyroidism, unspecified: Secondary | ICD-10-CM | POA: Diagnosis not present

## 2016-04-29 ENCOUNTER — Ambulatory Visit
Admission: RE | Admit: 2016-04-29 | Discharge: 2016-04-29 | Disposition: A | Discharge status: Home / Self Care | Payer: 59 | Source: Ambulatory Visit | Attending: Radiation Oncology | Admitting: Radiation Oncology

## 2016-04-29 ENCOUNTER — Encounter: Payer: Self-pay | Admitting: Oncology

## 2016-04-29 ENCOUNTER — Encounter: Payer: Self-pay | Admitting: Radiation Oncology

## 2016-04-29 VITALS — BP 127/79 | HR 102 | Temp 98.2°F | Ht 64.0 in | Wt 190.0 lb

## 2016-04-29 DIAGNOSIS — Z6832 Body mass index (BMI) 32.0-32.9, adult: Secondary | ICD-10-CM | POA: Diagnosis not present

## 2016-04-29 DIAGNOSIS — K219 Gastro-esophageal reflux disease without esophagitis: Secondary | ICD-10-CM | POA: Diagnosis not present

## 2016-04-29 DIAGNOSIS — E039 Hypothyroidism, unspecified: Secondary | ICD-10-CM | POA: Diagnosis not present

## 2016-04-29 DIAGNOSIS — E663 Overweight: Secondary | ICD-10-CM | POA: Diagnosis not present

## 2016-04-29 DIAGNOSIS — C561 Malignant neoplasm of right ovary: Secondary | ICD-10-CM | POA: Diagnosis not present

## 2016-04-29 DIAGNOSIS — Z510 Encounter for antineoplastic radiation therapy: Secondary | ICD-10-CM | POA: Diagnosis not present

## 2016-04-29 DIAGNOSIS — G4730 Sleep apnea, unspecified: Secondary | ICD-10-CM | POA: Diagnosis not present

## 2016-04-29 DIAGNOSIS — C569 Malignant neoplasm of unspecified ovary: Secondary | ICD-10-CM | POA: Diagnosis not present

## 2016-04-29 DIAGNOSIS — G47419 Narcolepsy without cataplexy: Secondary | ICD-10-CM | POA: Diagnosis not present

## 2016-04-29 DIAGNOSIS — I10 Essential (primary) hypertension: Secondary | ICD-10-CM | POA: Diagnosis not present

## 2016-04-29 NOTE — Progress Notes (Signed)
  Radiation Oncology         (336) [phone] ________________________________  Name: Kayla Kelley MRN: [id number]  Date: 04/29/2016  DOB: September 06, 1965   Weekly Radiation Therapy Management    ICD-9-CM ICD-10-CM   1. Epithelial ovarian cancer, FIGO stage IIIA (HCC) 183.0 C56.9      Current Dose: 35.4 Gy      Planned Dose:  55 Gy  Narrative . . . . . . . . The patient presents for routine under treatment assessment.                                    Kayla Kelley has completed 14 fractions to her paraaortic region.  She denies having pain.  She does report having a cold with a runny nose/cough.  She reports having a few waves of nausea, but has not had to take any antiemetics yet.  She reports having diarrhea once a day.  She reports having some hyperpigmentation on her abdomen.  She is using radiaplex bid.  She reports having fatigue and would like to only work 2 night a week.                                  Set-up films were reviewed.                                 The chart was checked. Physical Findings. . .  height is 5\' 4"  (1.626 m) and weight is 190 lb (86.2 kg). Her oral temperature is 98.2 F (36.8 C). Her blood pressure is 127/79 and her pulse is 102 (abnormal). Her oxygen saturation is 100%.   Lungs are clear to auscultation bilaterally. Heart has regular rate and rhythm. Abdomen soft, non tender, normal bowel sounds. Impression . . . . . . . The patient is tolerating radiation. Plan . . . . . . . . . . . . Continue treatment as planned. _______________________________   Blair Promise, PhD, MD  This document serves as a record of services personally performed by Gery Pray, MD. It was created on his behalf by Darcus Austin, a trained medical scribe. The creation of this record is based on the scribe's personal observations and the provider's statements to them. This document has been checked and approved by the attending provider.

## 2016-04-29 NOTE — Progress Notes (Signed)
Kayla Kelley has completed 14 fractions to her paraaortic.  She denies having pain.  She does report having a cold with a runny nose/cough.  She reports having a few waves of nausea but has not had to take any antiemetics yet.  She reports having diarrhea once a day.  She reports having some hyperpigmentation on her abdomen.  She is using radiaplex bid.  She reports having fatigue and would like to only work 2 night a week.  BP 127/79 (BP Location: Right Arm, Patient Position: Sitting)   Pulse (!) 102   Temp 98.2 F (36.8 C) (Oral)   Ht 5\' 4"  (1.626 m)   Wt 190 lb (86.2 kg)   LMP 05/05/2010   SpO2 100%   BMI 32.61 kg/m

## 2016-04-30 ENCOUNTER — Ambulatory Visit
Admission: RE | Admit: 2016-04-30 | Discharge: 2016-04-30 | Disposition: A | Discharge status: Home / Self Care | Payer: 59 | Source: Ambulatory Visit | Attending: Radiation Oncology | Admitting: Radiation Oncology

## 2016-04-30 DIAGNOSIS — G4730 Sleep apnea, unspecified: Secondary | ICD-10-CM | POA: Diagnosis not present

## 2016-04-30 DIAGNOSIS — I10 Essential (primary) hypertension: Secondary | ICD-10-CM | POA: Diagnosis not present

## 2016-04-30 DIAGNOSIS — E039 Hypothyroidism, unspecified: Secondary | ICD-10-CM | POA: Diagnosis not present

## 2016-04-30 DIAGNOSIS — C561 Malignant neoplasm of right ovary: Secondary | ICD-10-CM | POA: Diagnosis not present

## 2016-04-30 DIAGNOSIS — C569 Malignant neoplasm of unspecified ovary: Secondary | ICD-10-CM | POA: Diagnosis not present

## 2016-04-30 DIAGNOSIS — E663 Overweight: Secondary | ICD-10-CM | POA: Diagnosis not present

## 2016-04-30 DIAGNOSIS — Z510 Encounter for antineoplastic radiation therapy: Secondary | ICD-10-CM | POA: Diagnosis not present

## 2016-04-30 DIAGNOSIS — K219 Gastro-esophageal reflux disease without esophagitis: Secondary | ICD-10-CM | POA: Diagnosis not present

## 2016-04-30 DIAGNOSIS — Z6832 Body mass index (BMI) 32.0-32.9, adult: Secondary | ICD-10-CM | POA: Diagnosis not present

## 2016-04-30 DIAGNOSIS — G47419 Narcolepsy without cataplexy: Secondary | ICD-10-CM | POA: Diagnosis not present

## 2016-05-01 ENCOUNTER — Ambulatory Visit
Admission: RE | Admit: 2016-05-01 | Discharge: 2016-05-01 | Disposition: A | Discharge status: Home / Self Care | Payer: 59 | Source: Ambulatory Visit | Attending: Radiation Oncology | Admitting: Radiation Oncology

## 2016-05-01 DIAGNOSIS — E039 Hypothyroidism, unspecified: Secondary | ICD-10-CM | POA: Diagnosis not present

## 2016-05-01 DIAGNOSIS — I10 Essential (primary) hypertension: Secondary | ICD-10-CM | POA: Diagnosis not present

## 2016-05-01 DIAGNOSIS — C569 Malignant neoplasm of unspecified ovary: Secondary | ICD-10-CM | POA: Diagnosis not present

## 2016-05-01 DIAGNOSIS — G4730 Sleep apnea, unspecified: Secondary | ICD-10-CM | POA: Diagnosis not present

## 2016-05-01 DIAGNOSIS — G47419 Narcolepsy without cataplexy: Secondary | ICD-10-CM | POA: Diagnosis not present

## 2016-05-01 DIAGNOSIS — Z510 Encounter for antineoplastic radiation therapy: Secondary | ICD-10-CM | POA: Diagnosis not present

## 2016-05-01 DIAGNOSIS — Z6832 Body mass index (BMI) 32.0-32.9, adult: Secondary | ICD-10-CM | POA: Diagnosis not present

## 2016-05-01 DIAGNOSIS — E663 Overweight: Secondary | ICD-10-CM | POA: Diagnosis not present

## 2016-05-01 DIAGNOSIS — K219 Gastro-esophageal reflux disease without esophagitis: Secondary | ICD-10-CM | POA: Diagnosis not present

## 2016-05-01 DIAGNOSIS — C561 Malignant neoplasm of right ovary: Secondary | ICD-10-CM | POA: Diagnosis not present

## 2016-05-02 ENCOUNTER — Ambulatory Visit
Admission: RE | Admit: 2016-05-02 | Discharge: 2016-05-02 | Disposition: A | Discharge status: Home / Self Care | Payer: 59 | Source: Ambulatory Visit | Attending: Radiation Oncology | Admitting: Radiation Oncology

## 2016-05-02 DIAGNOSIS — C561 Malignant neoplasm of right ovary: Secondary | ICD-10-CM | POA: Diagnosis not present

## 2016-05-02 DIAGNOSIS — I10 Essential (primary) hypertension: Secondary | ICD-10-CM | POA: Diagnosis not present

## 2016-05-02 DIAGNOSIS — G47419 Narcolepsy without cataplexy: Secondary | ICD-10-CM | POA: Diagnosis not present

## 2016-05-02 DIAGNOSIS — K219 Gastro-esophageal reflux disease without esophagitis: Secondary | ICD-10-CM | POA: Diagnosis not present

## 2016-05-02 DIAGNOSIS — G4730 Sleep apnea, unspecified: Secondary | ICD-10-CM | POA: Diagnosis not present

## 2016-05-02 DIAGNOSIS — Z6832 Body mass index (BMI) 32.0-32.9, adult: Secondary | ICD-10-CM | POA: Diagnosis not present

## 2016-05-02 DIAGNOSIS — Z510 Encounter for antineoplastic radiation therapy: Secondary | ICD-10-CM | POA: Diagnosis not present

## 2016-05-02 DIAGNOSIS — E039 Hypothyroidism, unspecified: Secondary | ICD-10-CM | POA: Diagnosis not present

## 2016-05-02 DIAGNOSIS — E663 Overweight: Secondary | ICD-10-CM | POA: Diagnosis not present

## 2016-05-02 DIAGNOSIS — C569 Malignant neoplasm of unspecified ovary: Secondary | ICD-10-CM | POA: Diagnosis not present

## 2016-05-05 ENCOUNTER — Ambulatory Visit
Admission: RE | Admit: 2016-05-05 | Discharge: 2016-05-05 | Disposition: A | Discharge status: Home / Self Care | Payer: 59 | Source: Ambulatory Visit | Attending: Radiation Oncology | Admitting: Radiation Oncology

## 2016-05-05 DIAGNOSIS — G47419 Narcolepsy without cataplexy: Secondary | ICD-10-CM | POA: Diagnosis not present

## 2016-05-05 DIAGNOSIS — E039 Hypothyroidism, unspecified: Secondary | ICD-10-CM | POA: Diagnosis not present

## 2016-05-05 DIAGNOSIS — Z510 Encounter for antineoplastic radiation therapy: Secondary | ICD-10-CM | POA: Diagnosis not present

## 2016-05-05 DIAGNOSIS — G4730 Sleep apnea, unspecified: Secondary | ICD-10-CM | POA: Diagnosis not present

## 2016-05-05 DIAGNOSIS — C569 Malignant neoplasm of unspecified ovary: Secondary | ICD-10-CM | POA: Diagnosis not present

## 2016-05-05 DIAGNOSIS — I10 Essential (primary) hypertension: Secondary | ICD-10-CM | POA: Diagnosis not present

## 2016-05-05 DIAGNOSIS — Z6832 Body mass index (BMI) 32.0-32.9, adult: Secondary | ICD-10-CM | POA: Diagnosis not present

## 2016-05-05 DIAGNOSIS — C561 Malignant neoplasm of right ovary: Secondary | ICD-10-CM | POA: Diagnosis not present

## 2016-05-05 DIAGNOSIS — E663 Overweight: Secondary | ICD-10-CM | POA: Diagnosis not present

## 2016-05-05 DIAGNOSIS — K219 Gastro-esophageal reflux disease without esophagitis: Secondary | ICD-10-CM | POA: Diagnosis not present

## 2016-05-06 ENCOUNTER — Encounter: Payer: Self-pay | Admitting: Radiation Oncology

## 2016-05-06 ENCOUNTER — Ambulatory Visit
Admission: RE | Admit: 2016-05-06 | Discharge: 2016-05-06 | Disposition: A | Discharge status: Home / Self Care | Payer: 59 | Source: Ambulatory Visit | Attending: Radiation Oncology | Admitting: Radiation Oncology

## 2016-05-06 VITALS — BP 133/78 | HR 98 | Temp 98.4°F | Resp 18 | Wt 189.2 lb

## 2016-05-06 DIAGNOSIS — G4730 Sleep apnea, unspecified: Secondary | ICD-10-CM | POA: Diagnosis not present

## 2016-05-06 DIAGNOSIS — K219 Gastro-esophageal reflux disease without esophagitis: Secondary | ICD-10-CM | POA: Diagnosis not present

## 2016-05-06 DIAGNOSIS — Z510 Encounter for antineoplastic radiation therapy: Secondary | ICD-10-CM | POA: Diagnosis not present

## 2016-05-06 DIAGNOSIS — C569 Malignant neoplasm of unspecified ovary: Secondary | ICD-10-CM

## 2016-05-06 DIAGNOSIS — Z6832 Body mass index (BMI) 32.0-32.9, adult: Secondary | ICD-10-CM | POA: Diagnosis not present

## 2016-05-06 DIAGNOSIS — G47419 Narcolepsy without cataplexy: Secondary | ICD-10-CM | POA: Diagnosis not present

## 2016-05-06 DIAGNOSIS — I10 Essential (primary) hypertension: Secondary | ICD-10-CM | POA: Diagnosis not present

## 2016-05-06 DIAGNOSIS — E663 Overweight: Secondary | ICD-10-CM | POA: Diagnosis not present

## 2016-05-06 DIAGNOSIS — E039 Hypothyroidism, unspecified: Secondary | ICD-10-CM | POA: Diagnosis not present

## 2016-05-06 DIAGNOSIS — C561 Malignant neoplasm of right ovary: Secondary | ICD-10-CM | POA: Diagnosis not present

## 2016-05-06 NOTE — Progress Notes (Signed)
  Radiation Oncology         (336) [phone] ________________________________  Name: Kayla Kelley MRN: [id number]  Date: 05/06/2016  DOB: [birth year]   Weekly Radiation Therapy Management    ICD-9-CM ICD-10-CM   1. Epithelial ovarian cancer, FIGO stage IIIA (HCC) 183.0 C56.9      Current Dose: 46.2 Gy      Planned Dose:  55 Gy  Narrative . . . . . . . . The patient presents for routine under treatment assessment.                                    The patient has completed 21/25 treatments to her abdomen. Denies skin changes. She has slight nausea at times and had diarrhea yesterday for which she took [place]. She has a lot of gas. She has neuropathy in her hands and toes. She has a good appetite. She denies pain in her pelvis or back.                                  Set-up films were reviewed.                                 The chart was checked. Physical Findings. . .  weight is 189 lb 3.2 oz (85.8 kg). Her oral temperature is 98.4 F (36.9 C). Her blood pressure is 133/78 and her pulse is 98. Her respiration is 18.   Lungs are clear to auscultation bilaterally. Heart has regular rate and rhythm. Abdomen soft, non tender, normal bowel sounds. Impression . . . . . . . The patient is tolerating radiation. Plan . . . . . . . . . . . . Continue treatment as planned. A 1 month f/u appointment card was given. The patient can use Gas-X for her abdominal bloating/gas. _______________________________   Blair Promise, PhD, MD  This document serves as a record of services personally performed by Gery Pray, MD. It was created on his behalf by Darcus Austin, a trained medical scribe. The creation of this record is based on the scribe's personal observations and the provider's statements to them. This document has been checked and approved by the attending provider.

## 2016-05-06 NOTE — Progress Notes (Addendum)
Weekly rad txs abdomen  21/25 completed, no skin changes, slight nausea  At times, had diarrhea yesterday took pepto bis mul helped,  Has a lot of gas ,appetite good, , fatigued, still has neuropathy hands and toes , 1 month f/u appt given BP 133/78 (BP Location: Left Arm, Patient Position: Sitting, Cuff Size: Large)   Pulse 98   Temp 98.4 F (36.9 C) (Oral)   Resp 18   Wt 189 lb 3.2 oz (85.8 kg)   LMP 05/05/2010   BMI 32.48 kg/m   Wt Readings from Last 3 Encounters:  05/06/16 189 lb 3.2 oz (85.8 kg)  04/29/16 190 lb (86.2 kg)  04/22/16 187 lb 9.6 oz (85.1 kg)

## 2016-05-07 ENCOUNTER — Ambulatory Visit
Admission: RE | Admit: 2016-05-07 | Discharge: 2016-05-07 | Disposition: A | Discharge status: Home / Self Care | Payer: 59 | Source: Ambulatory Visit | Attending: Radiation Oncology | Admitting: Radiation Oncology

## 2016-05-07 DIAGNOSIS — C561 Malignant neoplasm of right ovary: Secondary | ICD-10-CM | POA: Diagnosis not present

## 2016-05-07 DIAGNOSIS — G4730 Sleep apnea, unspecified: Secondary | ICD-10-CM | POA: Diagnosis not present

## 2016-05-07 DIAGNOSIS — Z6832 Body mass index (BMI) 32.0-32.9, adult: Secondary | ICD-10-CM | POA: Diagnosis not present

## 2016-05-07 DIAGNOSIS — E039 Hypothyroidism, unspecified: Secondary | ICD-10-CM | POA: Diagnosis not present

## 2016-05-07 DIAGNOSIS — I10 Essential (primary) hypertension: Secondary | ICD-10-CM | POA: Diagnosis not present

## 2016-05-07 DIAGNOSIS — Z510 Encounter for antineoplastic radiation therapy: Secondary | ICD-10-CM | POA: Diagnosis not present

## 2016-05-07 DIAGNOSIS — C569 Malignant neoplasm of unspecified ovary: Secondary | ICD-10-CM | POA: Diagnosis not present

## 2016-05-07 DIAGNOSIS — E663 Overweight: Secondary | ICD-10-CM | POA: Diagnosis not present

## 2016-05-07 DIAGNOSIS — G47419 Narcolepsy without cataplexy: Secondary | ICD-10-CM | POA: Diagnosis not present

## 2016-05-07 DIAGNOSIS — K219 Gastro-esophageal reflux disease without esophagitis: Secondary | ICD-10-CM | POA: Diagnosis not present

## 2016-05-08 ENCOUNTER — Ambulatory Visit
Admission: RE | Admit: 2016-05-08 | Discharge: 2016-05-08 | Disposition: A | Discharge status: Home / Self Care | Payer: 59 | Source: Ambulatory Visit | Attending: Radiation Oncology | Admitting: Radiation Oncology

## 2016-05-08 DIAGNOSIS — I10 Essential (primary) hypertension: Secondary | ICD-10-CM | POA: Diagnosis not present

## 2016-05-08 DIAGNOSIS — G47419 Narcolepsy without cataplexy: Secondary | ICD-10-CM | POA: Diagnosis not present

## 2016-05-08 DIAGNOSIS — G4730 Sleep apnea, unspecified: Secondary | ICD-10-CM | POA: Diagnosis not present

## 2016-05-08 DIAGNOSIS — K219 Gastro-esophageal reflux disease without esophagitis: Secondary | ICD-10-CM | POA: Diagnosis not present

## 2016-05-08 DIAGNOSIS — C569 Malignant neoplasm of unspecified ovary: Secondary | ICD-10-CM | POA: Diagnosis not present

## 2016-05-08 DIAGNOSIS — Z6832 Body mass index (BMI) 32.0-32.9, adult: Secondary | ICD-10-CM | POA: Diagnosis not present

## 2016-05-08 DIAGNOSIS — Z510 Encounter for antineoplastic radiation therapy: Secondary | ICD-10-CM | POA: Diagnosis not present

## 2016-05-08 DIAGNOSIS — C561 Malignant neoplasm of right ovary: Secondary | ICD-10-CM | POA: Diagnosis not present

## 2016-05-08 DIAGNOSIS — E663 Overweight: Secondary | ICD-10-CM | POA: Diagnosis not present

## 2016-05-08 DIAGNOSIS — E039 Hypothyroidism, unspecified: Secondary | ICD-10-CM | POA: Diagnosis not present

## 2016-05-09 ENCOUNTER — Ambulatory Visit: Payer: 59

## 2016-05-09 ENCOUNTER — Ambulatory Visit
Admission: RE | Admit: 2016-05-09 | Discharge: 2016-05-09 | Disposition: A | Discharge status: Home / Self Care | Payer: 59 | Source: Ambulatory Visit | Attending: Radiation Oncology | Admitting: Radiation Oncology

## 2016-05-09 DIAGNOSIS — G47419 Narcolepsy without cataplexy: Secondary | ICD-10-CM | POA: Diagnosis not present

## 2016-05-09 DIAGNOSIS — G4730 Sleep apnea, unspecified: Secondary | ICD-10-CM | POA: Diagnosis not present

## 2016-05-09 DIAGNOSIS — C569 Malignant neoplasm of unspecified ovary: Secondary | ICD-10-CM | POA: Diagnosis not present

## 2016-05-09 DIAGNOSIS — I10 Essential (primary) hypertension: Secondary | ICD-10-CM | POA: Diagnosis not present

## 2016-05-09 DIAGNOSIS — Z6832 Body mass index (BMI) 32.0-32.9, adult: Secondary | ICD-10-CM | POA: Diagnosis not present

## 2016-05-09 DIAGNOSIS — E663 Overweight: Secondary | ICD-10-CM | POA: Diagnosis not present

## 2016-05-09 DIAGNOSIS — C561 Malignant neoplasm of right ovary: Secondary | ICD-10-CM | POA: Diagnosis not present

## 2016-05-09 DIAGNOSIS — E039 Hypothyroidism, unspecified: Secondary | ICD-10-CM | POA: Diagnosis not present

## 2016-05-09 DIAGNOSIS — Z510 Encounter for antineoplastic radiation therapy: Secondary | ICD-10-CM | POA: Diagnosis not present

## 2016-05-09 DIAGNOSIS — K219 Gastro-esophageal reflux disease without esophagitis: Secondary | ICD-10-CM | POA: Diagnosis not present

## 2016-05-12 ENCOUNTER — Ambulatory Visit
Admission: RE | Admit: 2016-05-12 | Discharge: 2016-05-12 | Disposition: A | Discharge status: Home / Self Care | Payer: 59 | Source: Ambulatory Visit | Attending: Radiation Oncology | Admitting: Radiation Oncology

## 2016-05-12 ENCOUNTER — Ambulatory Visit: Payer: 59

## 2016-05-12 DIAGNOSIS — K219 Gastro-esophageal reflux disease without esophagitis: Secondary | ICD-10-CM | POA: Diagnosis not present

## 2016-05-12 DIAGNOSIS — C561 Malignant neoplasm of right ovary: Secondary | ICD-10-CM | POA: Diagnosis not present

## 2016-05-12 DIAGNOSIS — G47419 Narcolepsy without cataplexy: Secondary | ICD-10-CM | POA: Diagnosis not present

## 2016-05-12 DIAGNOSIS — G4730 Sleep apnea, unspecified: Secondary | ICD-10-CM | POA: Diagnosis not present

## 2016-05-12 DIAGNOSIS — Z6832 Body mass index (BMI) 32.0-32.9, adult: Secondary | ICD-10-CM | POA: Diagnosis not present

## 2016-05-12 DIAGNOSIS — E039 Hypothyroidism, unspecified: Secondary | ICD-10-CM | POA: Diagnosis not present

## 2016-05-12 DIAGNOSIS — E663 Overweight: Secondary | ICD-10-CM | POA: Diagnosis not present

## 2016-05-12 DIAGNOSIS — C569 Malignant neoplasm of unspecified ovary: Secondary | ICD-10-CM | POA: Diagnosis not present

## 2016-05-12 DIAGNOSIS — I10 Essential (primary) hypertension: Secondary | ICD-10-CM | POA: Diagnosis not present

## 2016-05-12 DIAGNOSIS — Z510 Encounter for antineoplastic radiation therapy: Secondary | ICD-10-CM | POA: Diagnosis not present

## 2016-05-13 ENCOUNTER — Ambulatory Visit: Payer: 59

## 2016-05-14 ENCOUNTER — Encounter: Payer: Self-pay | Admitting: Radiation Oncology

## 2016-05-14 ENCOUNTER — Ambulatory Visit: Payer: 59

## 2016-05-14 NOTE — Progress Notes (Signed)
  Radiation Oncology         (336) [phone] ________________________________  Name: Kayla Kelley MRN: [id number]  Date: 05/14/2016  DOB: 15-May-1965  End of Treatment Note  Diagnosis:  Stage IIIa high-grade serous carcinoma of the ovary   Indication for treatment:  Curative, residual disease after surgery and chemotherapy      Radiation treatment dates:  04/07/16-05/12/16  Site/dose:   Para-aortic/left pelvic nodal disease;  55 Gy in 25 fractions  Beams/energy:   IMRT/ 6X  Narrative: The patient tolerated radiation treatment relatively well. During treatment, the patient complained of slight nausea and diarrhea.  Plan: The patient has completed radiation treatment. The patient will return to radiation oncology clinic for routine followup in one month. I advised them to call or return sooner if they have any questions or concerns related to their recovery or treatment.  -----------------------------------  Blair Promise, PhD, MD  This document serves as a record of services personally performed by Gery Pray, MD. It was created on his behalf by Bethann Humble, a trained medical scribe. The creation of this record is based on the scribe's personal observations and the provider's statements to them. This document has been checked and approved by the attending provider.

## 2016-05-15 ENCOUNTER — Ambulatory Visit: Payer: 59

## 2016-05-16 ENCOUNTER — Ambulatory Visit: Payer: 59

## 2016-05-23 ENCOUNTER — Encounter: Payer: Self-pay | Admitting: Radiation Oncology

## 2016-05-23 NOTE — Progress Notes (Signed)
Letter faxed to Joanna Hews w/[employer] on 04/30/15, [phone], confirmation received, letter faxed to [facility], failure notice x4, [phone]

## 2016-06-23 ENCOUNTER — Encounter: Payer: Self-pay | Admitting: Oncology

## 2016-06-26 ENCOUNTER — Ambulatory Visit
Admission: RE | Admit: 2016-06-26 | Discharge: 2016-06-26 | Disposition: A | Discharge status: Home / Self Care | Payer: 59 | Source: Ambulatory Visit | Attending: Radiation Oncology | Admitting: Radiation Oncology

## 2016-06-26 ENCOUNTER — Encounter: Payer: Self-pay | Admitting: Radiation Oncology

## 2016-06-26 DIAGNOSIS — C569 Malignant neoplasm of unspecified ovary: Secondary | ICD-10-CM | POA: Insufficient documentation

## 2016-06-26 NOTE — Progress Notes (Addendum)
Kayla Kelley is here for follow up.  She denies having any pain, nausea or diarrhea.  She reports the only symptom she is experiencing is belching.  She said it gets worse when she is nervous.  She is taking Gas-X as needed.  She reports her energy level is improved and is able to go up stairs now.  She has gone back to work full time.  She is concerned because she was approved for social security disability and is not sure what to do about it.  The skin on her abdomen has slight hyperpigmentation.  BP 137/79 (BP Location: Right Arm, Patient Position: Sitting)   Pulse 100   Temp 98.4 F (36.9 C) (Oral)   Ht 5\' 4"  (1.626 m)   Wt 187 lb 9.6 oz (85.1 kg)   LMP 05/05/2010   SpO2 99%   BMI 32.20 kg/m    Wt Readings from Last 3 Encounters:  06/26/16 187 lb 9.6 oz (85.1 kg)  05/06/16 189 lb 3.2 oz (85.8 kg)  04/29/16 190 lb (86.2 kg)

## 2016-06-26 NOTE — Progress Notes (Signed)
  Radiation Oncology         (336) [phone] ________________________________  Name: Kayla Kelley MRN: [id number]  Date: 06/26/2016  DOB: [birth year]  Follow-Up Visit Note  CC: Kayla Post, MD  Kayla Olds, MD    ICD-9-CM ICD-10-CM   1. Epithelial ovarian cancer, FIGO stage IIIA (HCC) 183.0 C56.9     Diagnosis:   Stage IIIa high-grade serous carcinoma of the ovary  Interval Since Last Radiation:   6 weeks   Site/dose:   Para-aortic/left pelvic nodal disease;  55 Gy in 25 fractions (IMRT)   Narrative:  The patient returns today for routine follow-up.  Patient states her energy level is at least 90% of baseline. She does endorse mild neuropathy. She is not taking Neurontin and states symptoms are not "horrible."  Patient endorses excessive belching. She has yet to follow up with Dr. Fermin Kelley.  No nausea, diarrhea or cramping.                      ALLERGIES:  is allergic to ampicillin.  Meds: Current Outpatient Prescriptions  Medication Sig Dispense Refill  . levothyroxine (SYNTHROID, LEVOTHROID) 50 MCG tablet Take 1 tablet (50 mcg total) by mouth daily. 90 tablet 3  . acetaminophen (TYLENOL) 325 MG tablet Take 650 mg by mouth.    . ALPRAZolam (XANAX) 0.25 MG tablet Take 1 tablet (0.25 mg total) by mouth 2 (two) times daily as needed for anxiety. (Patient not taking: Reported on 04/15/2016) 30 tablet 0  . hyaluronate sodium (RADIAPLEXRX) GEL Apply 1 application topically 2 (two) times daily.    Marland Kitchen ibuprofen (ADVIL,MOTRIN) 600 MG tablet Take 600 mg by mouth.     No current facility-administered medications for this encounter.     Physical Findings: The patient is in no acute distress. Patient is alert and oriented.  height is 5\' 4"  (1.626 m) and weight is 187 lb 9.6 oz (85.1 kg). Her oral temperature is 98.4 F (36.9 C). Her blood pressure is 137/79 and her pulse is 100. Her oxygen saturation is 99%. .  Lungs are clear to auscultation bilaterally. Heart has regular  rate and rhythm. No palpable cervical, supraclavicular, or axillary adenopathy. Abdomen soft, non-tender, normal bowel sounds. Portacath still in place.  Lab Findings: Lab Results  Component Value Date   WBC 4.8 02/11/2016   HGB 10.6 (L) 02/11/2016   HCT 32.2 (L) 02/11/2016   MCV 94.7 02/11/2016   PLT 323 02/11/2016    Radiographic Findings: No results found.  Impression:  The patient is recovering from the effects of radiation.  Clinically doing well at this time.  Plan:  Patient will follow up with Dr. Fermin Kelley next week in [facility] and have blood work done there. I  Would recommend repeat PET scan 2-3 months from now if Dr. Fermin Kelley agrees.. She would prefer to have scans done here in [place].  -----------------------------------  Blair Promise, PhD, MD  This document serves as a record of services personally performed by Gery Pray, MD. It was created on his behalf by Linward Natal, a trained medical scribe. The creation of this record is based on the scribe's personal observations and the provider's statements to them. This document has been checked and approved by the attending provider.

## 2016-07-03 ENCOUNTER — Other Ambulatory Visit: Payer: Self-pay | Admitting: Gynecologic Oncology

## 2016-07-03 DIAGNOSIS — C569 Malignant neoplasm of unspecified ovary: Secondary | ICD-10-CM

## 2016-07-03 DIAGNOSIS — Z801 Family history of malignant neoplasm of trachea, bronchus and lung: Secondary | ICD-10-CM | POA: Diagnosis not present

## 2016-07-03 DIAGNOSIS — I10 Essential (primary) hypertension: Secondary | ICD-10-CM | POA: Diagnosis not present

## 2016-07-03 DIAGNOSIS — E669 Obesity, unspecified: Secondary | ICD-10-CM | POA: Diagnosis not present

## 2016-07-03 DIAGNOSIS — E039 Hypothyroidism, unspecified: Secondary | ICD-10-CM | POA: Diagnosis not present

## 2016-07-03 DIAGNOSIS — G4733 Obstructive sleep apnea (adult) (pediatric): Secondary | ICD-10-CM | POA: Diagnosis not present

## 2016-07-03 DIAGNOSIS — Z90710 Acquired absence of both cervix and uterus: Secondary | ICD-10-CM | POA: Diagnosis not present

## 2016-07-03 DIAGNOSIS — Z923 Personal history of irradiation: Secondary | ICD-10-CM | POA: Diagnosis not present

## 2016-07-03 DIAGNOSIS — Z833 Family history of diabetes mellitus: Secondary | ICD-10-CM | POA: Diagnosis not present

## 2016-07-04 ENCOUNTER — Telehealth: Payer: Self-pay | Admitting: *Deleted

## 2016-07-04 NOTE — Telephone Encounter (Signed)
Contacted the patient and gave the date/timeof the PET scan. Scan scheduled for June 4th at 8am; arrrive at 7:30an. Also instructed the patient NPO after midnight

## 2016-07-15 ENCOUNTER — Ambulatory Visit (HOSPITAL_COMMUNITY)
Admission: RE | Admit: 2016-07-15 | Discharge: 2016-07-15 | Disposition: A | Discharge status: Home / Self Care | Payer: 59 | Source: Ambulatory Visit | Attending: Gynecologic Oncology | Admitting: Gynecologic Oncology

## 2016-07-15 DIAGNOSIS — C569 Malignant neoplasm of unspecified ovary: Secondary | ICD-10-CM | POA: Insufficient documentation

## 2016-07-15 LAB — GLUCOSE, CAPILLARY: Glucose-Capillary: 111 mg/dL — ABNORMAL HIGH (ref 65–99)

## 2016-07-15 MED ORDER — FLUDEOXYGLUCOSE F - 18 (FDG) INJECTION
8.9400 | Freq: Once | INTRAVENOUS | Status: AC | PRN
  Administered 2016-07-15: 8.94 via INTRAVENOUS

## 2016-07-17 DIAGNOSIS — I10 Essential (primary) hypertension: Secondary | ICD-10-CM | POA: Diagnosis not present

## 2016-07-17 DIAGNOSIS — G4733 Obstructive sleep apnea (adult) (pediatric): Secondary | ICD-10-CM | POA: Diagnosis not present

## 2016-07-17 DIAGNOSIS — C786 Secondary malignant neoplasm of retroperitoneum and peritoneum: Secondary | ICD-10-CM | POA: Diagnosis not present

## 2016-07-17 DIAGNOSIS — E669 Obesity, unspecified: Secondary | ICD-10-CM | POA: Diagnosis not present

## 2016-07-17 DIAGNOSIS — K219 Gastro-esophageal reflux disease without esophagitis: Secondary | ICD-10-CM | POA: Diagnosis not present

## 2016-07-17 DIAGNOSIS — C569 Malignant neoplasm of unspecified ovary: Secondary | ICD-10-CM | POA: Diagnosis not present

## 2016-07-17 DIAGNOSIS — E039 Hypothyroidism, unspecified: Secondary | ICD-10-CM | POA: Diagnosis not present

## 2016-07-17 DIAGNOSIS — C775 Secondary and unspecified malignant neoplasm of intrapelvic lymph nodes: Secondary | ICD-10-CM | POA: Diagnosis not present

## 2016-07-17 DIAGNOSIS — C772 Secondary and unspecified malignant neoplasm of intra-abdominal lymph nodes: Secondary | ICD-10-CM | POA: Diagnosis not present

## 2016-07-22 ENCOUNTER — Other Ambulatory Visit: Payer: Self-pay | Admitting: Family Medicine

## 2016-07-22 MED ORDER — LEVOTHYROXINE SODIUM 50 MCG PO TABS: 50.0000 ug | ORAL_TABLET | Freq: Every day | ORAL | 3 refills | Status: DC

## 2016-07-22 MED ORDER — ALPRAZOLAM 0.25 MG PO TABS: 0.2500 mg | ORAL_TABLET | Freq: Two times a day (BID) | ORAL | 0 refills | Status: DC | PRN

## 2016-07-22 MED FILL — PROCHLORPERAZINE 10 MG TAB: 10 | 8 days supply | Qty: 30 | Fill #0

## 2016-07-22 MED FILL — ONDANSETRON HCL 8 MG TABLET: 8 | 10 days supply | Qty: 30 | Fill #0

## 2016-07-22 MED FILL — LEVOTHYROXINE 50 MCG TABLET: 50 | 90 days supply | Qty: 90 | Fill #0

## 2016-07-22 MED FILL — ALPRAZolam 0.25 MG TABS: 0.25 | 15 days supply | Qty: 30 | Fill #0

## 2016-07-24 DIAGNOSIS — Z5112 Encounter for antineoplastic immunotherapy: Secondary | ICD-10-CM | POA: Diagnosis not present

## 2016-07-24 DIAGNOSIS — C569 Malignant neoplasm of unspecified ovary: Secondary | ICD-10-CM | POA: Diagnosis not present

## 2016-07-28 ENCOUNTER — Other Ambulatory Visit (HOSPITAL_COMMUNITY)
Admission: RE | Admit: 2016-07-28 | Discharge: 2016-07-28 | Disposition: A | Discharge status: Home / Self Care | Payer: 59 | Source: Ambulatory Visit | Attending: Gynecology | Admitting: Gynecology

## 2016-07-28 DIAGNOSIS — C569 Malignant neoplasm of unspecified ovary: Secondary | ICD-10-CM | POA: Diagnosis not present

## 2016-07-28 LAB — CBC WITH DIFFERENTIAL/PLATELET
BASOS ABS: 0 10*3/uL (ref 0.0–0.1)
Basophils Relative: 0 %
EOS ABS: 0.2 10*3/uL (ref 0.0–0.7)
Eosinophils Relative: 7 %
HEMATOCRIT: 32.8 % — AB (ref 36.0–46.0)
HEMOGLOBIN: 10.8 g/dL — AB (ref 12.0–15.0)
Lymphocytes Relative: 7 %
Lymphs Abs: 0.3 10*3/uL — ABNORMAL LOW (ref 0.7–4.0)
MCH: 31.8 pg (ref 26.0–34.0)
MCHC: 32.9 g/dL (ref 30.0–36.0)
MCV: 96.5 fL (ref 78.0–100.0)
MONO ABS: 0 10*3/uL — AB (ref 0.1–1.0)
Monocytes Relative: 1 %
NEUTROS ABS: 2.9 10*3/uL (ref 1.7–7.7)
NEUTROS PCT: 85 %
Platelets: 292 10*3/uL (ref 150–400)
RBC: 3.4 MIL/uL — ABNORMAL LOW (ref 3.87–5.11)
RDW: 13.4 % (ref 11.5–15.5)
WBC: 3.4 10*3/uL — ABNORMAL LOW (ref 4.0–10.5)

## 2016-08-04 ENCOUNTER — Other Ambulatory Visit (HOSPITAL_COMMUNITY)
Admission: RE | Admit: 2016-08-04 | Discharge: 2016-08-04 | Disposition: A | Discharge status: Home / Self Care | Payer: 59 | Source: Ambulatory Visit | Attending: Gynecology | Admitting: Gynecology

## 2016-08-04 DIAGNOSIS — C569 Malignant neoplasm of unspecified ovary: Secondary | ICD-10-CM | POA: Diagnosis not present

## 2016-08-04 LAB — COMPREHENSIVE METABOLIC PANEL
ALBUMIN: 3.9 g/dL (ref 3.5–5.0)
ALK PHOS: 67 U/L (ref 38–126)
ALT: 29 U/L (ref 14–54)
ANION GAP: 9 (ref 5–15)
AST: 21 U/L (ref 15–41)
BUN: 11 mg/dL (ref 6–20)
CO2: 27 mmol/L (ref 22–32)
Calcium: 9.1 mg/dL (ref 8.9–10.3)
Chloride: 102 mmol/L (ref 101–111)
Creatinine, Ser: 0.76 mg/dL (ref 0.44–1.00)
GFR calc Af Amer: >60 mL/min (ref >60)
GFR calc non Af Amer: >60 mL/min (ref >60)
GLUCOSE: 120 mg/dL — AB (ref 65–99)
POTASSIUM: 3.8 mmol/L (ref 3.5–5.1)
SODIUM: 138 mmol/L (ref 135–145)
Total Bilirubin: 0.5 mg/dL (ref 0.3–1.2)
Total Protein: 7.4 g/dL (ref 6.5–8.1)

## 2016-08-04 LAB — URINALYSIS, ROUTINE W REFLEX MICROSCOPIC
BACTERIA UA: NONE SEEN
BILIRUBIN URINE: NEGATIVE
Glucose, UA: NEGATIVE mg/dL
Hgb urine dipstick: NEGATIVE
KETONES UR: NEGATIVE mg/dL
NITRITE: NEGATIVE
PROTEIN: NEGATIVE mg/dL
Specific Gravity, Urine: 1.005 (ref 1.005–1.030)
pH: 8 (ref 5.0–8.0)

## 2016-08-04 LAB — CBC WITH DIFFERENTIAL/PLATELET
BASOS PCT: 1 %
Basophils Absolute: 0 10*3/uL (ref 0.0–0.1)
EOS ABS: 0.3 10*3/uL (ref 0.0–0.7)
Eosinophils Relative: 4 %
HCT: 29.4 % — ABNORMAL LOW (ref 36.0–46.0)
HEMOGLOBIN: 9.8 g/dL — AB (ref 12.0–15.0)
Lymphocytes Relative: 9 %
Lymphs Abs: 0.5 10*3/uL — ABNORMAL LOW (ref 0.7–4.0)
MCH: 31.8 pg (ref 26.0–34.0)
MCHC: 33.3 g/dL (ref 30.0–36.0)
MCV: 95.5 fL (ref 78.0–100.0)
MONOS PCT: 9 %
Monocytes Absolute: 0.5 10*3/uL (ref 0.1–1.0)
NEUTROS ABS: 4.4 10*3/uL (ref 1.7–7.7)
NEUTROS PCT: 78 %
Platelets: 78 10*3/uL — ABNORMAL LOW (ref 150–400)
RBC: 3.08 MIL/uL — ABNORMAL LOW (ref 3.87–5.11)
RDW: 13.8 % (ref 11.5–15.5)
WBC: 5.7 10*3/uL (ref 4.0–10.5)

## 2016-08-04 LAB — MAGNESIUM: Magnesium: 1.7 mg/dL (ref 1.7–2.4)

## 2016-08-07 ENCOUNTER — Ambulatory Visit
Admission: RE | Admit: 2016-08-07 | Discharge: 2016-08-07 | Disposition: A | Discharge status: Home / Self Care | Payer: Commercial Managed Care - PPO

## 2016-08-07 DIAGNOSIS — C569 Malignant neoplasm of unspecified ovary: Principal | ICD-10-CM

## 2016-08-07 DIAGNOSIS — Z5112 Encounter for antineoplastic immunotherapy: Secondary | ICD-10-CM | POA: Diagnosis not present

## 2016-08-07 DIAGNOSIS — Z5111 Encounter for antineoplastic chemotherapy: Secondary | ICD-10-CM | POA: Diagnosis not present

## 2016-08-11 ENCOUNTER — Other Ambulatory Visit (HOSPITAL_COMMUNITY)
Admission: RE | Admit: 2016-08-11 | Discharge: 2016-08-11 | Disposition: A | Discharge status: Home / Self Care | Payer: 59 | Source: Ambulatory Visit | Attending: Gynecology | Admitting: Gynecology

## 2016-08-11 DIAGNOSIS — C569 Malignant neoplasm of unspecified ovary: Secondary | ICD-10-CM | POA: Diagnosis not present

## 2016-08-11 LAB — CBC WITH DIFFERENTIAL/PLATELET
BASOS ABS: 0 10*3/uL (ref 0.0–0.1)
Basophils Relative: 1 %
Eosinophils Absolute: 0.1 10*3/uL (ref 0.0–0.7)
Eosinophils Relative: 4 %
HEMATOCRIT: 30.2 % — AB (ref 36.0–46.0)
Hemoglobin: 10.1 g/dL — ABNORMAL LOW (ref 12.0–15.0)
LYMPHS ABS: 0.3 10*3/uL — AB (ref 0.7–4.0)
LYMPHS PCT: 14 %
MCH: 32 pg (ref 26.0–34.0)
MCHC: 33.4 g/dL (ref 30.0–36.0)
MCV: 95.6 fL (ref 78.0–100.0)
MONO ABS: 0 10*3/uL — AB (ref 0.1–1.0)
Monocytes Relative: 1 %
NEUTROS ABS: 1.8 10*3/uL (ref 1.7–7.7)
Neutrophils Relative %: 80 %
Platelets: 237 10*3/uL (ref 150–400)
RBC: 3.16 MIL/uL — AB (ref 3.87–5.11)
RDW: 14.3 % (ref 11.5–15.5)
WBC: 2.2 10*3/uL — AB (ref 4.0–10.5)

## 2016-08-18 ENCOUNTER — Other Ambulatory Visit (HOSPITAL_COMMUNITY)
Admission: RE | Admit: 2016-08-18 | Discharge: 2016-08-18 | Disposition: A | Discharge status: Home / Self Care | Payer: 59 | Source: Ambulatory Visit | Attending: Gynecology | Admitting: Gynecology

## 2016-08-18 ENCOUNTER — Other Ambulatory Visit: Payer: Self-pay | Admitting: *Deleted

## 2016-08-18 LAB — COMPREHENSIVE METABOLIC PANEL
ALBUMIN: 3.8 g/dL (ref 3.5–5.0)
ALT: 22 U/L (ref 14–54)
AST: 18 U/L (ref 15–41)
Alkaline Phosphatase: 69 U/L (ref 38–126)
Anion gap: 9 (ref 5–15)
BILIRUBIN TOTAL: 0.6 mg/dL (ref 0.3–1.2)
BUN: 15 mg/dL (ref 6–20)
CO2: 27 mmol/L (ref 22–32)
CREATININE: 0.88 mg/dL (ref 0.44–1.00)
Calcium: 8.6 mg/dL — ABNORMAL LOW (ref 8.9–10.3)
Chloride: 104 mmol/L (ref 101–111)
GFR calc Af Amer: >60 mL/min (ref >60)
GFR calc non Af Amer: >60 mL/min (ref >60)
Glucose, Bld: 122 mg/dL — ABNORMAL HIGH (ref 65–99)
Potassium: 3.7 mmol/L (ref 3.5–5.1)
SODIUM: 140 mmol/L (ref 135–145)
TOTAL PROTEIN: 7 g/dL (ref 6.5–8.1)

## 2016-08-18 LAB — CBC WITH DIFFERENTIAL/PLATELET
BASOS PCT: 1 %
Basophils Absolute: 0 10*3/uL (ref 0.0–0.1)
EOS ABS: 0.1 10*3/uL (ref 0.0–0.7)
Eosinophils Relative: 2 %
HEMATOCRIT: 28.1 % — AB (ref 36.0–46.0)
HEMOGLOBIN: 9.6 g/dL — AB (ref 12.0–15.0)
Lymphocytes Relative: 15 %
Lymphs Abs: 0.6 10*3/uL — ABNORMAL LOW (ref 0.7–4.0)
MCH: 32 pg (ref 26.0–34.0)
MCHC: 34.2 g/dL (ref 30.0–36.0)
MCV: 93.7 fL (ref 78.0–100.0)
MONOS PCT: 10 %
Monocytes Absolute: 0.4 10*3/uL (ref 0.1–1.0)
NEUTROS ABS: 3.1 10*3/uL (ref 1.7–7.7)
NEUTROS PCT: 74 %
Platelets: DECREASED 10*3/uL (ref 150–400)
RBC: 3 MIL/uL — AB (ref 3.87–5.11)
RDW: 14.7 % (ref 11.5–15.5)
WBC: 4.2 10*3/uL (ref 4.0–10.5)

## 2016-08-18 LAB — MAGNESIUM: Magnesium: 1.5 mg/dL — ABNORMAL LOW (ref 1.7–2.4)

## 2016-08-18 LAB — URINALYSIS, ROUTINE W REFLEX MICROSCOPIC
BACTERIA UA: NONE SEEN
Bilirubin Urine: NEGATIVE
GLUCOSE, UA: NEGATIVE mg/dL
Hgb urine dipstick: NEGATIVE
KETONES UR: NEGATIVE mg/dL
NITRITE: NEGATIVE
PROTEIN: NEGATIVE mg/dL
Specific Gravity, Urine: 1.016 (ref 1.005–1.030)
pH: 5 (ref 5.0–8.0)

## 2016-08-18 NOTE — Patient Outreach (Signed)
[facility]) Care Management  08/18/2016  CONLEY PAWLING [birth year] [phone]   Subjective: Telephone call to patient's home  / mobile number, no answer, left HIPAA compliant voicemail message, and requested call back.   Objective: Per KPN (point of care tool), chart review, and update from [facility] CM call on 08/14/16.   Patient has a history of stage 3 C high grade serous carcinoma of the ovary, status post chemo, radiation, surgery  in 2017, now with new hypermetabolic peritoneal metastases. New chemo of Cisplatin, gemcitabine, bevacizumab initiated. Current status: Stable, OP chemo and periodic surveillance scans. She is otherwise asymptomatic. Member has declined need for CM at this time from [facility].    Assessment: Received [facility] Consult from [facility] CM Call on 08/15/16.   [facility] CM is requesting patient outreach from [place] to assess for CM needs.   [facility] Consult follow up pending patient contact.    Plan: RNCM will call patient for 2nd telephone outreach attempt, [facility] Consult follow up, within 10 business days if no return call.    Ayme Short H. Annia Friendly, BSN, [place] [facility] Telephonic CM Phone: [phone] Fax: [phone]

## 2016-08-19 ENCOUNTER — Other Ambulatory Visit: Payer: Self-pay | Admitting: *Deleted

## 2016-08-19 NOTE — Patient Outreach (Signed)
[place] [facility]) Care Management  08/19/2016  Kayla Kelley 09-01-1965 [id number]   Subjective: Telephone call to patient's home  / mobile number, no answer, left HIPAA compliant voicemail message, and requested call back.   Objective: Per KPN (point of care tool), chart review, and update from [facility] CM call on 08/14/16.   Patient has a history of stage 3 C high grade serous carcinoma of the ovary, status post chemo, radiation, surgery in 2017, now with new hypermetabolic peritoneal metastases. New chemo of Cisplatin, gemcitabine, bevacizumab initiated. Current status: Stable, OP chemo and periodic surveillance scans. She is otherwise asymptomatic. Member has declined need for CM at this time from [facility].    Assessment: Received [facility] Consult from [facility] CM Call on 08/15/16.   [facility] CM is requesting patient outreach from [place] to assess for CM needs.   [facility] Consult follow up pending patient contact.    Plan: RNCM will call patient for 3rd telephone outreach attempt, [facility] Consult follow up, within 10 business days if no return call.    Kayla Kelley H. Annia Friendly, BSN, [place] Management [facility] Telephonic CM Phone: [phone] Fax: [phone]

## 2016-08-20 ENCOUNTER — Encounter: Payer: Self-pay | Admitting: *Deleted

## 2016-08-20 ENCOUNTER — Other Ambulatory Visit: Payer: Self-pay | Admitting: *Deleted

## 2016-08-20 NOTE — Patient Outreach (Signed)
[place] [facility]) Care Management  08/20/2016  Kayla Kelley [birth year] [id number]   Subjective: Telephone call to patient's home / mobile number, no answer, left HIPAA compliant voicemail message, and requested call back.   Objective: Per KPN (point of care tool), chart review, and update from [facility] CM call on 08/14/16. 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 New chemo of Cisplatin, gemcitabine, bevacizumab initiated. Current status: Stable, OP chemo and periodic surveillance scans. She is otherwise asymptomatic. Member has declined need for CM at this timefrom [name].    Assessment: Received [facility] Consult from [facility] CM Call on 08/15/16. UMR CM is requesting patient outreach from [place] to assess for CM needs. [facility] Consult follow up pending patient contact.    Plan: RNCM will send unsuccessful outreach  letter, [facility] pamphlet, and proceed with case closure, within 10 business days if no return call.   Bard Haupert H. Annia Friendly, BSN, [place] Management [facility] Telephonic CM Phone: [phone] Fax: [phone]

## 2016-08-21 ENCOUNTER — Ambulatory Visit
Admission: RE | Admit: 2016-08-21 | Discharge: 2016-08-21 | Disposition: A | Discharge status: Home / Self Care | Payer: Commercial Managed Care - PPO

## 2016-08-21 DIAGNOSIS — C569 Malignant neoplasm of unspecified ovary: Principal | ICD-10-CM

## 2016-08-21 DIAGNOSIS — Z5112 Encounter for antineoplastic immunotherapy: Secondary | ICD-10-CM | POA: Diagnosis not present

## 2016-08-21 DIAGNOSIS — Z5111 Encounter for antineoplastic chemotherapy: Secondary | ICD-10-CM | POA: Diagnosis not present

## 2016-08-22 ENCOUNTER — Other Ambulatory Visit: Payer: Self-pay | Admitting: *Deleted

## 2016-08-22 NOTE — Patient Outreach (Signed)
[place] [facility]) Care Management  08/22/2016  Kayla Kelley Dec 08, 1965 [id number]  Subjective: Received voicemail message from patient, states she is returning call, will be in [facility] for chemo, and will also try to call back.  Telephone call to patient's home  / mobile number, no answer, left HIPAA compliant voicemail message, and requested call back.   Objective: Per KPN (point of care tool), chart review, and update from [facility] CM call on 08/14/16. 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 New chemo of Cisplatin, gemcitabine, bevacizumab initiated. Current status: Stable, OP chemo and periodic surveillance scans. She is otherwise asymptomatic. Member has declined need for CM at this timefrom [name].    Assessment: Received [facility] Consult from [facility] CM Call on 08/15/16. UMR CM is requesting patient outreach from [place] to assess for CM needs. [facility] Consult follow up pending patient contact.    Plan: RNCM has sent unsuccessful outreach  letter, [facility] pamphlet, and proceed with case closure, within 10 business days of 08/20/16, if no return call.   Kayla Kelley, BSN, [place] Management [facility] Telephonic CM Phone: [phone] Fax: [phone]

## 2016-08-25 DIAGNOSIS — R6889 Other general symptoms and signs: Secondary | ICD-10-CM | POA: Diagnosis not present

## 2016-08-25 DIAGNOSIS — C569 Malignant neoplasm of unspecified ovary: Secondary | ICD-10-CM | POA: Diagnosis not present

## 2016-09-01 ENCOUNTER — Other Ambulatory Visit (HOSPITAL_COMMUNITY)
Admission: RE | Admit: 2016-09-01 | Discharge: 2016-09-01 | Disposition: A | Discharge status: Home / Self Care | Payer: 59 | Source: Ambulatory Visit | Attending: Gynecology | Admitting: Gynecology

## 2016-09-01 DIAGNOSIS — C569 Malignant neoplasm of unspecified ovary: Secondary | ICD-10-CM | POA: Diagnosis not present

## 2016-09-01 LAB — URINALYSIS, ROUTINE W REFLEX MICROSCOPIC
BILIRUBIN URINE: NEGATIVE
Glucose, UA: NEGATIVE mg/dL
HGB URINE DIPSTICK: NEGATIVE
Ketones, ur: NEGATIVE mg/dL
Leukocytes, UA: NEGATIVE
NITRITE: NEGATIVE
PH: 7 (ref 5.0–8.0)
Protein, ur: NEGATIVE mg/dL
SPECIFIC GRAVITY, URINE: 1.003 — AB (ref 1.005–1.030)

## 2016-09-01 LAB — CBC WITH DIFFERENTIAL/PLATELET
BASOS PCT: 1 %
Basophils Absolute: 0 10*3/uL (ref 0.0–0.1)
EOS ABS: 0.1 10*3/uL (ref 0.0–0.7)
EOS PCT: 2 %
HCT: 27.7 % — ABNORMAL LOW (ref 36.0–46.0)
Hemoglobin: 9.5 g/dL — ABNORMAL LOW (ref 12.0–15.0)
LYMPHS ABS: 0.5 10*3/uL — AB (ref 0.7–4.0)
Lymphocytes Relative: 15 %
MCH: 31.7 pg (ref 26.0–34.0)
MCHC: 34.3 g/dL (ref 30.0–36.0)
MCV: 92.3 fL (ref 78.0–100.0)
MONO ABS: 0.7 10*3/uL (ref 0.1–1.0)
Monocytes Relative: 19 %
NEUTROS ABS: 2.2 10*3/uL (ref 1.7–7.7)
NEUTROS PCT: 63 %
PLATELETS: DECREASED 10*3/uL (ref 150–400)
RBC: 3 MIL/uL — ABNORMAL LOW (ref 3.87–5.11)
RDW: 16.1 % — ABNORMAL HIGH (ref 11.5–15.5)
WBC: 3.5 10*3/uL — ABNORMAL LOW (ref 4.0–10.5)

## 2016-09-01 LAB — COMPREHENSIVE METABOLIC PANEL
ALT: 23 U/L (ref 14–54)
ANION GAP: 8 (ref 5–15)
AST: 21 U/L (ref 15–41)
Albumin: 3.8 g/dL (ref 3.5–5.0)
Alkaline Phosphatase: 61 U/L (ref 38–126)
BUN: 15 mg/dL (ref 6–20)
CALCIUM: 9 mg/dL (ref 8.9–10.3)
CHLORIDE: 104 mmol/L (ref 101–111)
CO2: 25 mmol/L (ref 22–32)
Creatinine, Ser: 0.85 mg/dL (ref 0.44–1.00)
Glucose, Bld: 97 mg/dL (ref 65–99)
Potassium: 4.4 mmol/L (ref 3.5–5.1)
SODIUM: 137 mmol/L (ref 135–145)
Total Bilirubin: 0.5 mg/dL (ref 0.3–1.2)
Total Protein: 6.8 g/dL (ref 6.5–8.1)

## 2016-09-01 LAB — MAGNESIUM: MAGNESIUM: 1.6 mg/dL — AB (ref 1.7–2.4)

## 2016-09-04 ENCOUNTER — Ambulatory Visit
Admission: RE | Admit: 2016-09-04 | Discharge: 2016-09-04 | Disposition: A | Discharge status: Home / Self Care | Payer: Commercial Managed Care - PPO

## 2016-09-04 DIAGNOSIS — C569 Malignant neoplasm of unspecified ovary: Principal | ICD-10-CM

## 2016-09-04 DIAGNOSIS — Z5112 Encounter for antineoplastic immunotherapy: Secondary | ICD-10-CM | POA: Diagnosis not present

## 2016-09-04 DIAGNOSIS — Z5111 Encounter for antineoplastic chemotherapy: Secondary | ICD-10-CM | POA: Diagnosis not present

## 2016-09-08 ENCOUNTER — Other Ambulatory Visit (HOSPITAL_COMMUNITY)
Admission: RE | Admit: 2016-09-08 | Discharge: 2016-09-08 | Disposition: A | Discharge status: Home / Self Care | Payer: 59 | Source: Ambulatory Visit | Attending: Gynecology | Admitting: Gynecology

## 2016-09-08 DIAGNOSIS — C569 Malignant neoplasm of unspecified ovary: Secondary | ICD-10-CM | POA: Insufficient documentation

## 2016-09-08 LAB — CBC WITH DIFFERENTIAL/PLATELET
BASOS PCT: 1 %
Basophils Absolute: 0 10*3/uL (ref 0.0–0.1)
Eosinophils Absolute: 0.1 10*3/uL (ref 0.0–0.7)
Eosinophils Relative: 6 %
HEMATOCRIT: 27.4 % — AB (ref 36.0–46.0)
HEMOGLOBIN: 9.1 g/dL — AB (ref 12.0–15.0)
LYMPHS ABS: 0.4 10*3/uL — AB (ref 0.7–4.0)
Lymphocytes Relative: 20 %
MCH: 31.2 pg (ref 26.0–34.0)
MCHC: 33.2 g/dL (ref 30.0–36.0)
MCV: 93.8 fL (ref 78.0–100.0)
MONOS PCT: 3 %
Monocytes Absolute: 0.1 10*3/uL (ref 0.1–1.0)
NEUTROS ABS: 1.3 10*3/uL — AB (ref 1.7–7.7)
NEUTROS PCT: 70 %
Platelets: 209 10*3/uL (ref 150–400)
RBC: 2.92 MIL/uL — AB (ref 3.87–5.11)
RDW: 16.8 % — ABNORMAL HIGH (ref 11.5–15.5)
WBC: 1.8 10*3/uL — AB (ref 4.0–10.5)

## 2016-09-09 ENCOUNTER — Other Ambulatory Visit: Payer: Self-pay | Admitting: *Deleted

## 2016-09-09 NOTE — Patient Outreach (Addendum)
[place] [facility]) Care Management  09/09/2016  Kayla Kelley 03/28/1965 [id number]  No response from patient outreach attempts will proceed with case closure.   Objective: Per KPN (point of care tool), chart review, and update from [facility] CM call on 08/14/16. 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 New chemo of Cisplatin, gemcitabine, bevacizumab initiated. Current status: Stable, OP chemo and periodic surveillance scans. She is otherwise asymptomatic. Member has declined need for CM at this timefrom [name].    Assessment: Received [facility] Consult from [facility] CM Call on 08/15/16. UMR CM is requesting patient outreach from [name] to assess for CM needs. [facility] Consult follow up not completed due to unable to contact patient and will proceed with case closure.   Plan: RNCM will send case closure due to unable to contact request to [name] [place] Management.    [name], BSN, [place] Management [facility] Telephonic CM Phone: [phone] Fax: [phone]

## 2016-09-15 ENCOUNTER — Other Ambulatory Visit (HOSPITAL_COMMUNITY)
Admission: RE | Admit: 2016-09-15 | Discharge: 2016-09-15 | Disposition: A | Discharge status: Home / Self Care | Payer: 59 | Source: Ambulatory Visit | Attending: Gynecology | Admitting: Gynecology

## 2016-09-15 DIAGNOSIS — C569 Malignant neoplasm of unspecified ovary: Secondary | ICD-10-CM | POA: Insufficient documentation

## 2016-09-15 LAB — COMPREHENSIVE METABOLIC PANEL
ALT: 21 U/L (ref 14–54)
AST: 20 U/L (ref 15–41)
Albumin: 3.8 g/dL (ref 3.5–5.0)
Alkaline Phosphatase: 60 U/L (ref 38–126)
Anion gap: 10 (ref 5–15)
BILIRUBIN TOTAL: 0.5 mg/dL (ref 0.3–1.2)
BUN: 17 mg/dL (ref 6–20)
CO2: 24 mmol/L (ref 22–32)
Calcium: 8.6 mg/dL — ABNORMAL LOW (ref 8.9–10.3)
Chloride: 102 mmol/L (ref 101–111)
Creatinine, Ser: 0.87 mg/dL (ref 0.44–1.00)
GFR calc Af Amer: >60 mL/min (ref >60)
Glucose, Bld: 108 mg/dL — ABNORMAL HIGH (ref 65–99)
POTASSIUM: 4.1 mmol/L (ref 3.5–5.1)
Sodium: 136 mmol/L (ref 135–145)
TOTAL PROTEIN: 7.1 g/dL (ref 6.5–8.1)

## 2016-09-15 LAB — URINALYSIS, ROUTINE W REFLEX MICROSCOPIC
Bilirubin Urine: NEGATIVE
GLUCOSE, UA: NEGATIVE mg/dL
Hgb urine dipstick: NEGATIVE
Ketones, ur: NEGATIVE mg/dL
Nitrite: NEGATIVE
PH: 7 (ref 5.0–8.0)
Protein, ur: NEGATIVE mg/dL
Specific Gravity, Urine: 1.008 (ref 1.005–1.030)

## 2016-09-15 LAB — CBC WITH DIFFERENTIAL/PLATELET
Basophils Absolute: 0 10*3/uL (ref 0.0–0.1)
Basophils Relative: 1 %
EOS ABS: 0.1 10*3/uL (ref 0.0–0.7)
EOS PCT: 2 %
HCT: 26.7 % — ABNORMAL LOW (ref 36.0–46.0)
Hemoglobin: 9.1 g/dL — ABNORMAL LOW (ref 12.0–15.0)
LYMPHS ABS: 0.5 10*3/uL — AB (ref 0.7–4.0)
Lymphocytes Relative: 13 %
MCH: 32.6 pg (ref 26.0–34.0)
MCHC: 34.1 g/dL (ref 30.0–36.0)
MCV: 95.7 fL (ref 78.0–100.0)
MONO ABS: 0.8 10*3/uL (ref 0.1–1.0)
Monocytes Relative: 19 %
Neutro Abs: 2.6 10*3/uL (ref 1.7–7.7)
Neutrophils Relative %: 66 %
PLATELETS: 92 10*3/uL — AB (ref 150–400)
RBC: 2.79 MIL/uL — AB (ref 3.87–5.11)
RDW: 17.8 % — AB (ref 11.5–15.5)
WBC: 3.9 10*3/uL — AB (ref 4.0–10.5)

## 2016-09-15 LAB — MAGNESIUM: MAGNESIUM: 1.5 mg/dL — AB (ref 1.7–2.4)

## 2016-09-18 ENCOUNTER — Ambulatory Visit
Admission: RE | Admit: 2016-09-18 | Discharge: 2016-09-18 | Disposition: A | Discharge status: Home / Self Care | Payer: Commercial Managed Care - PPO | Attending: Gynecologic Oncology | Admitting: Gynecologic Oncology

## 2016-09-18 ENCOUNTER — Ambulatory Visit
Admission: RE | Admit: 2016-09-18 | Discharge: 2016-09-18 | Disposition: A | Discharge status: Home / Self Care | Payer: Commercial Managed Care - PPO

## 2016-09-18 DIAGNOSIS — C569 Malignant neoplasm of unspecified ovary: Principal | ICD-10-CM

## 2016-09-18 DIAGNOSIS — Z5111 Encounter for antineoplastic chemotherapy: Secondary | ICD-10-CM | POA: Diagnosis not present

## 2016-09-18 DIAGNOSIS — Z5112 Encounter for antineoplastic immunotherapy: Secondary | ICD-10-CM | POA: Diagnosis not present

## 2016-09-22 ENCOUNTER — Other Ambulatory Visit (HOSPITAL_COMMUNITY)
Admission: RE | Admit: 2016-09-22 | Discharge: 2016-09-22 | Disposition: A | Discharge status: Home / Self Care | Payer: 59 | Source: Ambulatory Visit | Attending: Gynecology | Admitting: Gynecology

## 2016-09-22 DIAGNOSIS — C569 Malignant neoplasm of unspecified ovary: Secondary | ICD-10-CM | POA: Diagnosis not present

## 2016-09-22 LAB — CBC
HCT: 24.8 % — ABNORMAL LOW (ref 36.0–46.0)
Hemoglobin: 8.4 g/dL — ABNORMAL LOW (ref 12.0–15.0)
MCH: 32.8 pg (ref 26.0–34.0)
MCHC: 33.9 g/dL (ref 30.0–36.0)
MCV: 96.9 fL (ref 78.0–100.0)
PLATELETS: 190 10*3/uL (ref 150–400)
RBC: 2.56 MIL/uL — ABNORMAL LOW (ref 3.87–5.11)
RDW: 18.6 % — AB (ref 11.5–15.5)
WBC: 1.7 10*3/uL — AB (ref 4.0–10.5)

## 2016-09-29 ENCOUNTER — Other Ambulatory Visit (HOSPITAL_COMMUNITY)
Admission: RE | Admit: 2016-09-29 | Discharge: 2016-09-29 | Disposition: A | Discharge status: Home / Self Care | Payer: 59 | Source: Ambulatory Visit | Attending: Gynecology | Admitting: Gynecology

## 2016-09-29 DIAGNOSIS — C569 Malignant neoplasm of unspecified ovary: Secondary | ICD-10-CM | POA: Insufficient documentation

## 2016-09-29 LAB — CBC WITH DIFFERENTIAL/PLATELET
BASOS ABS: 0 10*3/uL (ref 0.0–0.1)
Basophils Relative: 1 %
EOS ABS: 0.2 10*3/uL (ref 0.0–0.7)
Eosinophils Relative: 4 %
HEMATOCRIT: 25.3 % — AB (ref 36.0–46.0)
Hemoglobin: 8.4 g/dL — ABNORMAL LOW (ref 12.0–15.0)
LYMPHS ABS: 0.9 10*3/uL (ref 0.7–4.0)
Lymphocytes Relative: 24 %
MCH: 32.8 pg (ref 26.0–34.0)
MCHC: 33.2 g/dL (ref 30.0–36.0)
MCV: 98.8 fL (ref 78.0–100.0)
MONO ABS: 0.2 10*3/uL (ref 0.1–1.0)
MONOS PCT: 4 %
NEUTROS ABS: 2.6 10*3/uL (ref 1.7–7.7)
Neutrophils Relative %: 67 %
PLATELETS: 58 10*3/uL — AB (ref 150–400)
RBC: 2.56 MIL/uL — AB (ref 3.87–5.11)
RDW: 19.6 % — AB (ref 11.5–15.5)
WBC: 3.9 10*3/uL — AB (ref 4.0–10.5)

## 2016-09-29 LAB — URINALYSIS, ROUTINE W REFLEX MICROSCOPIC
Bilirubin Urine: NEGATIVE
Glucose, UA: NEGATIVE mg/dL
Hgb urine dipstick: NEGATIVE
KETONES UR: NEGATIVE mg/dL
LEUKOCYTES UA: NEGATIVE
NITRITE: NEGATIVE
PH: 6 (ref 5.0–8.0)
PROTEIN: NEGATIVE mg/dL
Specific Gravity, Urine: 1.01 (ref 1.005–1.030)

## 2016-09-29 LAB — COMPREHENSIVE METABOLIC PANEL
ALBUMIN: 3.9 g/dL (ref 3.5–5.0)
ALT: 20 U/L (ref 14–54)
ANION GAP: 10 (ref 5–15)
AST: 20 U/L (ref 15–41)
Alkaline Phosphatase: 55 U/L (ref 38–126)
BILIRUBIN TOTAL: 0.3 mg/dL (ref 0.3–1.2)
BUN: 20 mg/dL (ref 6–20)
CHLORIDE: 102 mmol/L (ref 101–111)
CO2: 27 mmol/L (ref 22–32)
Calcium: 9.4 mg/dL (ref 8.9–10.3)
Creatinine, Ser: 1.32 mg/dL — ABNORMAL HIGH (ref 0.44–1.00)
GFR calc Af Amer: 53 mL/min — ABNORMAL LOW (ref >60)
GFR calc non Af Amer: 46 mL/min — ABNORMAL LOW (ref >60)
GLUCOSE: 107 mg/dL — AB (ref 65–99)
POTASSIUM: 4.1 mmol/L (ref 3.5–5.1)
Sodium: 139 mmol/L (ref 135–145)
TOTAL PROTEIN: 6.9 g/dL (ref 6.5–8.1)

## 2016-09-29 LAB — MAGNESIUM: MAGNESIUM: 1.3 mg/dL — AB (ref 1.7–2.4)

## 2016-10-02 ENCOUNTER — Telehealth: Payer: Self-pay | Admitting: Oncology

## 2016-10-02 ENCOUNTER — Ambulatory Visit: Payer: 59 | Attending: Radiation Oncology | Admitting: Radiation Oncology

## 2016-10-02 ENCOUNTER — Emergency Department
Admission: EM | Admit: 2016-10-02 | Discharge: 2016-10-02 | Disposition: A | Discharge status: Home / Self Care | Payer: Commercial Managed Care - PPO | Source: Intra-hospital | Attending: Emergency Medicine | Admitting: Emergency Medicine

## 2016-10-02 ENCOUNTER — Ambulatory Visit
Admission: RE | Admit: 2016-10-02 | Discharge: 2016-10-02 | Disposition: A | Discharge status: Home / Self Care | Payer: Commercial Managed Care - PPO

## 2016-10-02 DIAGNOSIS — C569 Malignant neoplasm of unspecified ovary: Principal | ICD-10-CM

## 2016-10-02 DIAGNOSIS — R51 Headache: Secondary | ICD-10-CM | POA: Diagnosis not present

## 2016-10-02 DIAGNOSIS — C55 Malignant neoplasm of uterus, part unspecified: Secondary | ICD-10-CM | POA: Diagnosis not present

## 2016-10-02 DIAGNOSIS — E669 Obesity, unspecified: Secondary | ICD-10-CM | POA: Diagnosis not present

## 2016-10-02 DIAGNOSIS — D649 Anemia, unspecified: Secondary | ICD-10-CM | POA: Diagnosis not present

## 2016-10-02 DIAGNOSIS — C562 Malignant neoplasm of left ovary: Secondary | ICD-10-CM | POA: Diagnosis not present

## 2016-10-02 DIAGNOSIS — Z801 Family history of malignant neoplasm of trachea, bronchus and lung: Secondary | ICD-10-CM | POA: Diagnosis not present

## 2016-10-02 DIAGNOSIS — N189 Chronic kidney disease, unspecified: Secondary | ICD-10-CM | POA: Diagnosis not present

## 2016-10-02 DIAGNOSIS — I129 Hypertensive chronic kidney disease with stage 1 through stage 4 chronic kidney disease, or unspecified chronic kidney disease: Secondary | ICD-10-CM | POA: Diagnosis not present

## 2016-10-02 DIAGNOSIS — E039 Hypothyroidism, unspecified: Secondary | ICD-10-CM | POA: Diagnosis not present

## 2016-10-02 DIAGNOSIS — C561 Malignant neoplasm of right ovary: Secondary | ICD-10-CM | POA: Diagnosis not present

## 2016-10-02 DIAGNOSIS — N289 Disorder of kidney and ureter, unspecified: Secondary | ICD-10-CM | POA: Diagnosis not present

## 2016-10-02 DIAGNOSIS — Z880 Allergy status to penicillin: Secondary | ICD-10-CM | POA: Diagnosis not present

## 2016-10-02 DIAGNOSIS — I10 Essential (primary) hypertension: Secondary | ICD-10-CM | POA: Diagnosis not present

## 2016-10-02 DIAGNOSIS — R000 Tachycardia, unspecified: Secondary | ICD-10-CM | POA: Diagnosis not present

## 2016-10-02 NOTE — Telephone Encounter (Signed)
Left a message for patient regarding her missed follow up appointment today. Requested a refill.

## 2016-10-03 ENCOUNTER — Ambulatory Visit (INDEPENDENT_AMBULATORY_CARE_PROVIDER_SITE_OTHER): Payer: 59 | Admitting: Family Medicine

## 2016-10-03 ENCOUNTER — Encounter: Payer: Self-pay | Admitting: Family Medicine

## 2016-10-03 VITALS — BP 148/90 | HR 118 | Temp 98.3°F | Ht 64.0 in | Wt 193.0 lb

## 2016-10-03 DIAGNOSIS — I10 Essential (primary) hypertension: Secondary | ICD-10-CM

## 2016-10-03 MED ORDER — LOSARTAN POTASSIUM 50 MG PO TABS: 50.0000 mg | ORAL_TABLET | Freq: Every day | ORAL | 5 refills | Status: DC

## 2016-10-03 MED FILL — LOSARTAN POTASSIUM 50 MG TA: 50 | 90 days supply | Qty: 90 | Fill #0

## 2016-10-03 NOTE — Patient Instructions (Signed)
Stay well hydrated.

## 2016-10-03 NOTE — Progress Notes (Signed)
Subjective:     Patient ID: Kayla Kelley, female   DOB: 05-19-1965, 51 y.o.   MRN: [id number]  HPI Patient is here to evaluate elevated blood pressure. She is undergoing chemotherapy for ovarian cancer and was recently started on Avastin-which can raise blood pressure. She went for chemotherapy yesterday and had blood pressure 210/110. She's never had prior issues of hypertension. She had recent lab work and had low platelets along with anemia. Her white blood count was stable. Patient did have some headache yesterday but none today. No peripheral edema issues.  She had recent creatinine 1.3 but states she did not drink a lot of fluids and she has been much more diligent with hydration since then.  Past Medical History:  Diagnosis Date  . Anemia   . Benign essential hypertension   . Fatigue   . GERD (gastroesophageal reflux disease)   . Hidradenitis suppurativa   . History of radiation therapy 04/07/16-05/12/16   para-aortic/left pelvic nodal disease 55 gy in 22 fractions  . Hypothyroidism   . Lumbosacral strain    acute  . Muscle spasm of back   . Narcolepsy without cataplexy(347.00)   . Ovarian cancer ([place])   . Ovarian cancer ([place])   . Overweight(278.02)   . PMS (premenstrual syndrome)   . Routine physical examination   . Sciatica    acute  . Sleep apnea   . Tachycardia   . UTI (urinary tract infection)    Past Surgical History:  Procedure Laterality Date  . BILATERAL SALPINGECTOMY  09/11/2015   At [facility] by Dr. Fermin Schwab - complete procedure: Resection(Initial) Ovarian/Tubal/Prima Pertonl Malig W/Bil S&O, Omntect; W/Tah/Ltd Para-Aortic Lymphadenect    reports that she has never smoked. She has never used smokeless tobacco. She reports that she does not drink alcohol or use drugs. family history includes Breast cancer in her paternal aunt; Diabetes in her father, mother, paternal aunt, paternal grandfather, and paternal grandmother; Heart disease (age of onset: 12) in her  mother; Lung cancer in her father. Allergies  Allergen Reactions  . Ampicillin     hives     Review of Systems  Constitutional: Negative for fatigue.  Eyes: Negative for visual disturbance.  Respiratory: Negative for cough, chest tightness, shortness of breath and wheezing.   Cardiovascular: Negative for chest pain, palpitations and leg swelling.  Neurological: Negative for dizziness, seizures, syncope, weakness, light-headedness and headaches.       Objective:   Physical Exam  Constitutional: She appears well-developed and well-nourished.  Eyes: Pupils are equal, round, and reactive to light.  Neck: Neck supple. No JVD present. No thyromegaly present.  Cardiovascular: Regular rhythm.  Exam reveals no gallop.   Pulmonary/Chest: Effort normal and breath sounds normal. No respiratory distress. She has no wheezes. She has no rales.  Musculoskeletal: She exhibits no edema.  Neurological: She is alert.       Assessment:     Hypertension. Likely exacerbated by Avastin    Plan:     -We discussed options with patient. Would probably try to avoid dihydropyridine CCB with baseline increased heart rate. . We discussed possible beta blocker use but would like to avoid increasing fatigue issues. We discussed ACE inhibitor vs angiotensin receptor blocker. -We decided on trial of losartan 50 mg once daily and emphasized importance of excellent hydration while taking this. She will also need to have repeat basic metabolic panel within 2 weeks.  Eulas Post MD [facility]

## 2016-10-06 ENCOUNTER — Other Ambulatory Visit (HOSPITAL_COMMUNITY)
Admission: RE | Admit: 2016-10-06 | Discharge: 2016-10-06 | Disposition: A | Discharge status: Home / Self Care | Payer: 59 | Source: Ambulatory Visit | Attending: Gynecology | Admitting: Gynecology

## 2016-10-06 DIAGNOSIS — C569 Malignant neoplasm of unspecified ovary: Secondary | ICD-10-CM | POA: Insufficient documentation

## 2016-10-06 LAB — DIFFERENTIAL
Basophils Absolute: 0 10*3/uL (ref 0.0–0.1)
Basophils Relative: 0 %
EOS ABS: 0.1 10*3/uL (ref 0.0–0.7)
EOS PCT: 4 %
Lymphocytes Relative: 11 %
Lymphs Abs: 0.4 10*3/uL — ABNORMAL LOW (ref 0.7–4.0)
MONO ABS: 0.5 10*3/uL (ref 0.1–1.0)
Monocytes Relative: 14 %
NEUTROS ABS: 2.3 10*3/uL (ref 1.7–7.7)
Neutrophils Relative %: 71 %

## 2016-10-06 LAB — CBC
HCT: 26.9 % — ABNORMAL LOW (ref 36.0–46.0)
Hemoglobin: 8.9 g/dL — ABNORMAL LOW (ref 12.0–15.0)
MCH: 33.1 pg (ref 26.0–34.0)
MCHC: 33.1 g/dL (ref 30.0–36.0)
MCV: 100 fL (ref 78.0–100.0)
Platelets: 230 10*3/uL (ref 150–400)
RBC: 2.69 MIL/uL — ABNORMAL LOW (ref 3.87–5.11)
RDW: 19.8 % — AB (ref 11.5–15.5)
WBC: 3.3 10*3/uL — AB (ref 4.0–10.5)

## 2016-10-07 ENCOUNTER — Encounter: Payer: Self-pay | Admitting: Family Medicine

## 2016-10-07 MED ORDER — LOSARTAN POTASSIUM-HCTZ 100-12.5 MG PO TABS: 1.0000 | ORAL_TABLET | Freq: Every day | ORAL | 5 refills | Status: DC

## 2016-10-08 MED FILL — LOSARTAN-HCTZ 100-12.5 MG T: 100-12.5 | 30 days supply | Qty: 30 | Fill #0

## 2016-10-09 ENCOUNTER — Ambulatory Visit
Admission: RE | Admit: 2016-10-09 | Discharge: 2016-10-09 | Disposition: A | Discharge status: Home / Self Care | Payer: Commercial Managed Care - PPO

## 2016-10-09 DIAGNOSIS — C569 Malignant neoplasm of unspecified ovary: Principal | ICD-10-CM

## 2016-10-09 DIAGNOSIS — Z5111 Encounter for antineoplastic chemotherapy: Secondary | ICD-10-CM | POA: Diagnosis not present

## 2016-10-13 ENCOUNTER — Other Ambulatory Visit (HOSPITAL_COMMUNITY)
Admission: RE | Admit: 2016-10-13 | Discharge: 2016-10-13 | Disposition: A | Discharge status: Home / Self Care | Payer: 59 | Source: Ambulatory Visit | Attending: Gynecology | Admitting: Gynecology

## 2016-10-13 DIAGNOSIS — C569 Malignant neoplasm of unspecified ovary: Secondary | ICD-10-CM | POA: Insufficient documentation

## 2016-10-13 LAB — CBC WITH DIFFERENTIAL/PLATELET
BASOS PCT: 0 %
Basophils Absolute: 0 10*3/uL (ref 0.0–0.1)
EOS ABS: 0.1 10*3/uL (ref 0.0–0.7)
Eosinophils Relative: 6 %
HCT: 26.5 % — ABNORMAL LOW (ref 36.0–46.0)
HEMOGLOBIN: 8.6 g/dL — AB (ref 12.0–15.0)
Lymphocytes Relative: 10 %
Lymphs Abs: 0.2 10*3/uL — ABNORMAL LOW (ref 0.7–4.0)
MCH: 32.1 pg (ref 26.0–34.0)
MCHC: 32.5 g/dL (ref 30.0–36.0)
MCV: 98.9 fL (ref 78.0–100.0)
MONOS PCT: 0 %
Monocytes Absolute: 0 10*3/uL — ABNORMAL LOW (ref 0.1–1.0)
NEUTROS PCT: 84 %
Neutro Abs: 1.9 10*3/uL (ref 1.7–7.7)
Platelets: 228 10*3/uL (ref 150–400)
RBC: 2.68 MIL/uL — ABNORMAL LOW (ref 3.87–5.11)
RDW: 17.8 % — AB (ref 11.5–15.5)
WBC: 2.3 10*3/uL — ABNORMAL LOW (ref 4.0–10.5)

## 2016-10-15 ENCOUNTER — Ambulatory Visit (INDEPENDENT_AMBULATORY_CARE_PROVIDER_SITE_OTHER): Payer: 59 | Admitting: Family Medicine

## 2016-10-15 ENCOUNTER — Encounter: Payer: Self-pay | Admitting: Family Medicine

## 2016-10-15 DIAGNOSIS — I10 Essential (primary) hypertension: Secondary | ICD-10-CM | POA: Diagnosis not present

## 2016-10-15 NOTE — Patient Instructions (Signed)
Go ahead and start the Losartan HCTZ Cut dose down to one half tablet if BP < 100/60 Make sure you get chemistries (BMP) by early next week.

## 2016-10-15 NOTE — Progress Notes (Signed)
Subjective:     Patient ID: Kayla Kelley, female   DOB: 1965/08/10, 51 y.o.   MRN: [id number]  HPI Patient seen for follow hypertension. Refer to recent note. She is on chemotherapy for ovarian cancer. Just recently started on agent (Avastin) which can increase blood pressure and she was referred here to address her elevated blood pressure. We started losartan 50 mg once daily. She called back and still had considerably elevated reading few days later. We called in losartan HCTZ 100/12.5 but she has not yet started that dosage. She still remains on losartan 50 mg daily. Blood pressures have improved somewhat but still getting frequent diastolics in the 69S and systolics up in the 854O. No headaches. No dizziness.  No alcohol use. No regular nonsteroidal use  Past Medical History:  Diagnosis Date  . Anemia   . Benign essential hypertension   . Fatigue   . GERD (gastroesophageal reflux disease)   . Hidradenitis suppurativa   . History of radiation therapy 04/07/16-05/12/16   para-aortic/left pelvic nodal disease 55 gy in 22 fractions  . Hypothyroidism   . Lumbosacral strain    acute  . Muscle spasm of back   . Narcolepsy without cataplexy(347.00)   . Ovarian cancer ([place])   . Ovarian cancer ([place])   . Overweight(278.02)   . PMS (premenstrual syndrome)   . Routine physical examination   . Sciatica    acute  . Sleep apnea   . Tachycardia   . UTI (urinary tract infection)    Past Surgical History:  Procedure Laterality Date  . BILATERAL SALPINGECTOMY  09/11/2015   At [facility] by Dr. Fermin Schwab - complete procedure: Resection(Initial) Ovarian/Tubal/Prima Pertonl Malig W/Bil S&O, Omntect; W/Tah/Ltd Para-Aortic Lymphadenect    reports that she has never smoked. She has never used smokeless tobacco. She reports that she does not drink alcohol or use drugs. family history includes Breast cancer in her paternal aunt; Diabetes in her father, mother, paternal aunt, paternal grandfather, and  paternal grandmother; Heart disease (age of onset: 56) in her mother; Lung cancer in her father. Allergies  Allergen Reactions  . Ampicillin     hives     Review of Systems  Constitutional: Negative for fatigue and unexpected weight change.  Eyes: Negative for visual disturbance.  Respiratory: Negative for cough, chest tightness, shortness of breath and wheezing.   Cardiovascular: Negative for chest pain, palpitations and leg swelling.  Neurological: Negative for dizziness, seizures, syncope, weakness, light-headedness and headaches.       Objective:   Physical Exam  Constitutional: She appears well-developed and well-nourished.  Cardiovascular: Normal rate and regular rhythm.   Pulmonary/Chest: Effort normal and breath sounds normal. No respiratory distress. She has no wheezes. She has no rales.  Musculoskeletal: She exhibits no edema.       Assessment:     Hypertension. Improved but suboptimal control    Plan:     -Go ahead and change to losartan HCTZ 100/12.5. -Patient needs follow-up basic metabolic panel. She is already scheduled for comprehensive metabolic panel Monday. She is encouraged to stay adequately hydrated. Be in touch for any blood pressures less than 270 systolic -Plan reassessment one week  Eulas Post MD [facility] at [facility]

## 2016-10-20 ENCOUNTER — Other Ambulatory Visit (HOSPITAL_COMMUNITY)
Admission: RE | Admit: 2016-10-20 | Discharge: 2016-10-20 | Disposition: A | Discharge status: Home / Self Care | Payer: 59 | Source: Ambulatory Visit | Attending: Gynecology | Admitting: Gynecology

## 2016-10-20 DIAGNOSIS — C569 Malignant neoplasm of unspecified ovary: Secondary | ICD-10-CM | POA: Insufficient documentation

## 2016-10-20 LAB — CBC WITH DIFFERENTIAL/PLATELET
BASOS PCT: 0 %
Basophils Absolute: 0 10*3/uL (ref 0.0–0.1)
EOS ABS: 0.1 10*3/uL (ref 0.0–0.7)
EOS PCT: 2 %
HCT: 25.5 % — ABNORMAL LOW (ref 36.0–46.0)
Hemoglobin: 8.8 g/dL — ABNORMAL LOW (ref 12.0–15.0)
Lymphocytes Relative: 15 %
Lymphs Abs: 0.8 10*3/uL (ref 0.7–4.0)
MCH: 33.1 pg (ref 26.0–34.0)
MCHC: 34.5 g/dL (ref 30.0–36.0)
MCV: 95.9 fL (ref 78.0–100.0)
Monocytes Absolute: 0.2 10*3/uL (ref 0.1–1.0)
Monocytes Relative: 4 %
Neutro Abs: 3.9 10*3/uL (ref 1.7–7.7)
Neutrophils Relative %: 78 %
PLATELETS: UNDETERMINED 10*3/uL (ref 150–400)
RBC: 2.66 MIL/uL — ABNORMAL LOW (ref 3.87–5.11)
RDW: 17.1 % — ABNORMAL HIGH (ref 11.5–15.5)
WBC: 4.9 10*3/uL (ref 4.0–10.5)

## 2016-10-20 LAB — MAGNESIUM: Magnesium: 1.2 mg/dL — ABNORMAL LOW (ref 1.7–2.4)

## 2016-10-20 LAB — COMPREHENSIVE METABOLIC PANEL
ALBUMIN: 4 g/dL (ref 3.5–5.0)
ALK PHOS: 69 U/L (ref 38–126)
ALT: 19 U/L (ref 14–54)
ANION GAP: 12 (ref 5–15)
AST: 19 U/L (ref 15–41)
BUN: 16 mg/dL (ref 6–20)
CALCIUM: 9.6 mg/dL (ref 8.9–10.3)
CHLORIDE: 90 mmol/L — AB (ref 101–111)
CO2: 27 mmol/L (ref 22–32)
Creatinine, Ser: 1.21 mg/dL — ABNORMAL HIGH (ref 0.44–1.00)
GFR calc Af Amer: 59 mL/min — ABNORMAL LOW (ref >60)
GFR calc non Af Amer: 51 mL/min — ABNORMAL LOW (ref >60)
GLUCOSE: 133 mg/dL — AB (ref 65–99)
POTASSIUM: 4.2 mmol/L (ref 3.5–5.1)
SODIUM: 129 mmol/L — AB (ref 135–145)
Total Bilirubin: 0.7 mg/dL (ref 0.3–1.2)
Total Protein: 7.1 g/dL (ref 6.5–8.1)

## 2016-10-20 LAB — URINALYSIS, ROUTINE W REFLEX MICROSCOPIC
BILIRUBIN URINE: NEGATIVE
Glucose, UA: NEGATIVE mg/dL
HGB URINE DIPSTICK: NEGATIVE
Ketones, ur: NEGATIVE mg/dL
LEUKOCYTES UA: NEGATIVE
Nitrite: NEGATIVE
PH: 6 (ref 5.0–8.0)
Protein, ur: NEGATIVE mg/dL
SPECIFIC GRAVITY, URINE: 1.011 (ref 1.005–1.030)

## 2016-10-22 ENCOUNTER — Ambulatory Visit (INDEPENDENT_AMBULATORY_CARE_PROVIDER_SITE_OTHER): Payer: 59 | Admitting: Family Medicine

## 2016-10-22 ENCOUNTER — Encounter: Payer: Self-pay | Admitting: Family Medicine

## 2016-10-22 VITALS — BP 130/98 | HR 94 | Temp 98.4°F | Wt 192.7 lb

## 2016-10-22 DIAGNOSIS — I10 Essential (primary) hypertension: Secondary | ICD-10-CM

## 2016-10-22 DIAGNOSIS — E871 Hypo-osmolality and hyponatremia: Secondary | ICD-10-CM

## 2016-10-22 MED ORDER — AMLODIPINE BESYLATE 5 MG PO TABS: 5.0000 mg | ORAL_TABLET | Freq: Every day | ORAL | 5 refills | Status: DC

## 2016-10-22 MED FILL — AMLODIPINE BESYLATE 5 MG TA: 5 | 90 days supply | Qty: 90 | Fill #0

## 2016-10-22 NOTE — Progress Notes (Signed)
Subjective:     Patient ID: Kayla Kelley, female   DOB: Feb 13, 1965, 51 y.o.   MRN: [id number]  HPI Patient seen for follow-up regarding her hypertension. We recently started losartan and blood pressure was still up and we added HCTZ. Currently taking losartan HCTZ 100/12.5 mg. She had lab work done 2 days ago with sodium 129 and creatinine 1.21 which is down from prior value of 1.32. Blood pressure still ranging 25O diastolic and 037 and above systolic. No headaches. She's had some malaise which thinks may be related to chemotherapy. No peripheral edema.  She had recent bump in creatinine from around 0.8-1.32. Ultrasound of the kidneys has been ordered  Past Medical History:  Diagnosis Date  . Anemia   . Benign essential hypertension   . Fatigue   . GERD (gastroesophageal reflux disease)   . Hidradenitis suppurativa   . History of radiation therapy 04/07/16-05/12/16   para-aortic/left pelvic nodal disease 55 gy in 22 fractions  . Hypothyroidism   . Lumbosacral strain    acute  . Muscle spasm of back   . Narcolepsy without cataplexy(347.00)   . Ovarian cancer ([place])   . Ovarian cancer ([place])   . Overweight(278.02)   . PMS (premenstrual syndrome)   . Routine physical examination   . Sciatica    acute  . Sleep apnea   . Tachycardia   . UTI (urinary tract infection)    Past Surgical History:  Procedure Laterality Date  . BILATERAL SALPINGECTOMY  09/11/2015   At [facility] by Dr. Fermin Schwab - complete procedure: Resection(Initial) Ovarian/Tubal/Prima Pertonl Malig W/Bil S&O, Omntect; W/Tah/Ltd Para-Aortic Lymphadenect    reports that she has never smoked. She has never used smokeless tobacco. She reports that she does not drink alcohol or use drugs. family history includes Breast cancer in her paternal aunt; Diabetes in her father, mother, paternal aunt, paternal grandfather, and paternal grandmother; Heart disease (age of onset: 76) in her mother; Lung cancer in her father. Allergies   Allergen Reactions  . Ampicillin     hives     Review of Systems  Constitutional: Positive for fatigue.  Eyes: Negative for visual disturbance.  Respiratory: Negative for cough, chest tightness, shortness of breath and wheezing.   Cardiovascular: Negative for chest pain, palpitations and leg swelling.  Neurological: Negative for dizziness, seizures, syncope, weakness, light-headedness and headaches.       Objective:   Physical Exam  Constitutional: She appears well-developed and well-nourished.  Eyes: Pupils are equal, round, and reactive to light.  Neck: Neck supple. No JVD present. No thyromegaly present.  Cardiovascular: Normal rate and regular rhythm.  Exam reveals no gallop.   Pulmonary/Chest: Effort normal and breath sounds normal. No respiratory distress. She has no wheezes. She has no rales.  Musculoskeletal: She exhibits no edema.  Neurological: She is alert.       Assessment:     Hypertension suboptimally controlled. Repeat left arm seated after rest 138/98  Mild hyponatremia by recent labs-will need to follow closely with recent initiation of thiazide    Plan:     -Add amlodipine 5 mg daily -Recheck basic metabolic panel in 1 week. If sodium dropping further will need to discontinue thiazide -Follow renal function closely. This is slightly improved compared to couple weeks ago -Office visit in 2 weeks to reassess  Eulas Post MD [place] [facility] at [facility]

## 2016-10-23 ENCOUNTER — Encounter: Payer: Self-pay | Admitting: Family Medicine

## 2016-10-23 ENCOUNTER — Ambulatory Visit
Admission: RE | Admit: 2016-10-23 | Discharge: 2016-10-23 | Disposition: A | Discharge status: Home / Self Care | Payer: Commercial Managed Care - PPO

## 2016-10-23 ENCOUNTER — Inpatient Hospital Stay
Admission: EM | Admit: 2016-10-23 | Discharge: 2016-10-25 | Disposition: A | Discharge status: Home / Self Care | Payer: Commercial Managed Care - PPO | Source: Assisted Living Facility | Attending: Gynecologic Oncology | Admitting: Gynecologic Oncology

## 2016-10-23 DIAGNOSIS — E871 Hypo-osmolality and hyponatremia: Secondary | ICD-10-CM

## 2016-10-23 DIAGNOSIS — C569 Malignant neoplasm of unspecified ovary: Principal | ICD-10-CM

## 2016-10-23 DIAGNOSIS — E039 Hypothyroidism, unspecified: Secondary | ICD-10-CM | POA: Diagnosis not present

## 2016-10-23 DIAGNOSIS — Z79899 Other long term (current) drug therapy: Secondary | ICD-10-CM | POA: Diagnosis not present

## 2016-10-23 DIAGNOSIS — C786 Secondary malignant neoplasm of retroperitoneum and peritoneum: Secondary | ICD-10-CM | POA: Diagnosis not present

## 2016-10-23 DIAGNOSIS — G4733 Obstructive sleep apnea (adult) (pediatric): Secondary | ICD-10-CM | POA: Diagnosis not present

## 2016-10-23 DIAGNOSIS — E669 Obesity, unspecified: Secondary | ICD-10-CM | POA: Diagnosis not present

## 2016-10-23 DIAGNOSIS — D61810 Antineoplastic chemotherapy induced pancytopenia: Secondary | ICD-10-CM | POA: Diagnosis not present

## 2016-10-23 DIAGNOSIS — Z880 Allergy status to penicillin: Secondary | ICD-10-CM | POA: Diagnosis not present

## 2016-10-23 DIAGNOSIS — Z923 Personal history of irradiation: Secondary | ICD-10-CM | POA: Diagnosis not present

## 2016-10-23 DIAGNOSIS — I10 Essential (primary) hypertension: Secondary | ICD-10-CM | POA: Diagnosis not present

## 2016-10-23 DIAGNOSIS — C561 Malignant neoplasm of right ovary: Secondary | ICD-10-CM | POA: Diagnosis not present

## 2016-10-25 MED ORDER — LOSARTAN 100 MG TABLET
ORAL_TABLET | Freq: Every day | ORAL | 0 refills | 0.00000 days | Status: CP
Start: 2016-10-25 — End: 2016-11-24

## 2016-10-25 MED ORDER — LEVOTHYROXINE 50 MCG TABLET
ORAL_TABLET | Freq: Every day | ORAL | 0 refills | 0.00000 days | Status: CP
Start: 2016-10-25 — End: 2016-11-24

## 2016-10-26 ENCOUNTER — Encounter: Payer: Self-pay | Admitting: Family Medicine

## 2016-10-27 ENCOUNTER — Other Ambulatory Visit (HOSPITAL_COMMUNITY)
Admission: RE | Admit: 2016-10-27 | Discharge: 2016-10-27 | Disposition: A | Discharge status: Home / Self Care | Payer: 59 | Source: Ambulatory Visit | Attending: Gynecology | Admitting: Gynecology

## 2016-10-27 DIAGNOSIS — E871 Hypo-osmolality and hyponatremia: Secondary | ICD-10-CM | POA: Insufficient documentation

## 2016-10-27 LAB — CBC WITH DIFFERENTIAL/PLATELET
BASOS ABS: 0 10*3/uL (ref 0.0–0.1)
Basophils Relative: 0 %
EOS ABS: 0.2 10*3/uL (ref 0.0–0.7)
EOS PCT: 6 %
HCT: 25.6 % — ABNORMAL LOW (ref 36.0–46.0)
Hemoglobin: 8.6 g/dL — ABNORMAL LOW (ref 12.0–15.0)
LYMPHS ABS: 0.4 10*3/uL — AB (ref 0.7–4.0)
LYMPHS PCT: 12 %
MCH: 34.4 pg — AB (ref 26.0–34.0)
MCHC: 33.6 g/dL (ref 30.0–36.0)
MCV: 102.4 fL — AB (ref 78.0–100.0)
MONO ABS: 0.5 10*3/uL (ref 0.1–1.0)
Monocytes Relative: 14 %
Neutro Abs: 2.5 10*3/uL (ref 1.7–7.7)
Neutrophils Relative %: 68 %
PLATELETS: 288 10*3/uL (ref 150–400)
RBC: 2.5 MIL/uL — ABNORMAL LOW (ref 3.87–5.11)
RDW: 17.4 % — AB (ref 11.5–15.5)
WBC: 3.6 10*3/uL — ABNORMAL LOW (ref 4.0–10.5)

## 2016-10-27 MED FILL — LEVOTHYROXINE 50 MCG TABLET: 50 | 30 days supply | Qty: 30 | Fill #0

## 2016-10-27 MED FILL — LOSARTAN POTASSIUM 100 MG T: 100 | 30 days supply | Qty: 30 | Fill #0

## 2016-10-29 ENCOUNTER — Encounter: Payer: Self-pay | Admitting: Family Medicine

## 2016-10-29 LAB — COMPREHENSIVE METABOLIC PANEL
ALBUMIN: 3.9 g/dL (ref 3.5–5.0)
ALT: 23 U/L (ref 14–54)
AST: 22 U/L (ref 15–41)
Alkaline Phosphatase: 72 U/L (ref 38–126)
Anion gap: 9 (ref 5–15)
BUN: 17 mg/dL (ref 6–20)
CHLORIDE: 102 mmol/L (ref 101–111)
CO2: 26 mmol/L (ref 22–32)
CREATININE: 1.21 mg/dL — AB (ref 0.44–1.00)
Calcium: 9 mg/dL (ref 8.9–10.3)
GFR calc Af Amer: 59 mL/min — ABNORMAL LOW (ref >60)
GFR, EST NON AFRICAN AMERICAN: 51 mL/min — AB (ref >60)
GLUCOSE: 128 mg/dL — AB (ref 65–99)
POTASSIUM: 4 mmol/L (ref 3.5–5.1)
SODIUM: 137 mmol/L (ref 135–145)
Total Bilirubin: 0.4 mg/dL (ref 0.3–1.2)
Total Protein: 6.8 g/dL (ref 6.5–8.1)

## 2016-10-29 LAB — MAGNESIUM: MAGNESIUM: 1.8 mg/dL (ref 1.7–2.4)

## 2016-10-29 MED ORDER — LOSARTAN POTASSIUM 100 MG PO TABS: 100.0000 mg | ORAL_TABLET | Freq: Every day | ORAL | 3 refills | Status: DC

## 2016-10-29 MED ORDER — AMLODIPINE BESYLATE 5 MG PO TABS: 5.0000 mg | ORAL_TABLET | Freq: Every day | ORAL | 3 refills | Status: DC

## 2016-10-30 ENCOUNTER — Other Ambulatory Visit: Payer: Self-pay | Admitting: *Deleted

## 2016-10-30 ENCOUNTER — Ambulatory Visit
Admission: RE | Admit: 2016-10-30 | Discharge: 2016-10-30 | Disposition: A | Discharge status: Home / Self Care | Payer: Commercial Managed Care - PPO

## 2016-10-30 DIAGNOSIS — C569 Malignant neoplasm of unspecified ovary: Principal | ICD-10-CM

## 2016-10-30 DIAGNOSIS — Z5112 Encounter for antineoplastic immunotherapy: Secondary | ICD-10-CM | POA: Diagnosis not present

## 2016-10-30 MED ORDER — LEVOTHYROXINE SODIUM 50 MCG PO TABS: 50.0000 ug | ORAL_TABLET | Freq: Every day | ORAL | 1 refills | Status: DC

## 2016-10-30 NOTE — Telephone Encounter (Signed)
Rx done. 

## 2016-11-03 ENCOUNTER — Other Ambulatory Visit (HOSPITAL_COMMUNITY)
Admission: RE | Admit: 2016-11-03 | Discharge: 2016-11-03 | Disposition: A | Discharge status: Home / Self Care | Payer: 59 | Source: Ambulatory Visit | Attending: Gynecology | Admitting: Gynecology

## 2016-11-03 DIAGNOSIS — C569 Malignant neoplasm of unspecified ovary: Secondary | ICD-10-CM | POA: Diagnosis not present

## 2016-11-03 LAB — CBC WITH DIFFERENTIAL/PLATELET
Basophils Absolute: 0 10*3/uL (ref 0.0–0.1)
Basophils Relative: 0 %
EOS PCT: 1 %
Eosinophils Absolute: 0.2 10*3/uL (ref 0.0–0.7)
HCT: 27.3 % — ABNORMAL LOW (ref 36.0–46.0)
HEMOGLOBIN: 8.9 g/dL — AB (ref 12.0–15.0)
LYMPHS PCT: 1 %
Lymphs Abs: 0.2 10*3/uL — ABNORMAL LOW (ref 0.7–4.0)
MCH: 33 pg (ref 26.0–34.0)
MCHC: 32.6 g/dL (ref 30.0–36.0)
MCV: 101.1 fL — AB (ref 78.0–100.0)
MONOS PCT: 1 %
Monocytes Absolute: 0.2 10*3/uL (ref 0.1–1.0)
NEUTROS PCT: 97 %
Neutro Abs: 14.6 10*3/uL — ABNORMAL HIGH (ref 1.7–7.7)
PLATELETS: 172 10*3/uL (ref 150–400)
RBC: 2.7 MIL/uL — AB (ref 3.87–5.11)
RDW: 16.7 % — ABNORMAL HIGH (ref 11.5–15.5)
WBC: 15.2 10*3/uL — AB (ref 4.0–10.5)

## 2016-11-05 ENCOUNTER — Encounter: Payer: Self-pay | Admitting: Family Medicine

## 2016-11-05 ENCOUNTER — Ambulatory Visit (INDEPENDENT_AMBULATORY_CARE_PROVIDER_SITE_OTHER): Payer: 59 | Admitting: Family Medicine

## 2016-11-05 VITALS — BP 124/86 | HR 109 | Temp 98.8°F | Wt 192.7 lb

## 2016-11-05 DIAGNOSIS — I10 Essential (primary) hypertension: Secondary | ICD-10-CM | POA: Diagnosis not present

## 2016-11-05 NOTE — Progress Notes (Signed)
Subjective:     Patient ID: Kayla Kelley, female   DOB: 28-Aug-1965, 51 y.o.   MRN: [id number]  HPI Follow-up hypertension. Patient recently went on losartan and had poor control and was switched to losartan HCTZ. Unfortunately, she developed hyponatremia and had to be admitted. She's now on some fluid restriction. She also had correction of low magnesium. She is currently on regimen of amlodipine 5 mg daily and losartan 100 mg daily. This seems to be controlling her blood pressure well by home readings. No headaches. No dizziness. No chest pains. She has been able to resume chemotherapy  Past Medical History:  Diagnosis Date  . Anemia   . Benign essential hypertension   . Fatigue   . GERD (gastroesophageal reflux disease)   . Hidradenitis suppurativa   . History of radiation therapy 04/07/16-05/12/16   para-aortic/left pelvic nodal disease 55 gy in 22 fractions  . Hypothyroidism   . Lumbosacral strain    acute  . Muscle spasm of back   . Narcolepsy without cataplexy(347.00)   . Ovarian cancer ([place])   . Ovarian cancer ([place])   . Overweight(278.02)   . PMS (premenstrual syndrome)   . Routine physical examination   . Sciatica    acute  . Sleep apnea   . Tachycardia   . UTI (urinary tract infection)    Past Surgical History:  Procedure Laterality Date  . BILATERAL SALPINGECTOMY  09/11/2015   At [facility] by Dr. Fermin Schwab - complete procedure: Resection(Initial) Ovarian/Tubal/Prima Pertonl Malig W/Bil S&O, Omntect; W/Tah/Ltd Para-Aortic Lymphadenect    reports that she has never smoked. She has never used smokeless tobacco. She reports that she does not drink alcohol or use drugs. family history includes Breast cancer in her paternal aunt; Diabetes in her father, mother, paternal aunt, paternal grandfather, and paternal grandmother; Heart disease (age of onset: 75) in her mother; Lung cancer in her father. Allergies  Allergen Reactions  . Ampicillin     hives     Review of  Systems  Constitutional: Negative for fatigue.  Eyes: Negative for visual disturbance.  Respiratory: Negative for cough, chest tightness, shortness of breath and wheezing.   Cardiovascular: Negative for chest pain, palpitations and leg swelling.  Neurological: Negative for dizziness, seizures, syncope, weakness, light-headedness and headaches.       Objective:   Physical Exam  Constitutional: She appears well-developed and well-nourished.  Eyes: Pupils are equal, round, and reactive to light.  Neck: Neck supple. No JVD present. No thyromegaly present.  Cardiovascular: Regular rhythm.  Exam reveals no gallop.   Pulmonary/Chest: Effort normal and breath sounds normal. No respiratory distress. She has no wheezes. She has no rales.  Musculoskeletal: She exhibits no edema.  Neurological: She is alert.       Assessment:     #1 hypertension improved  #2 recent hyponatremia-likely exacerbated by HCTZ    Plan:     -Continue current regimen -Avoid thiazide diuretics -Be in touch if blood pressure consistently greater than 130/80  Eulas Post MD [facility] [facility] at [facility]

## 2016-11-05 NOTE — Patient Instructions (Signed)
Let me know if BP is consistently > 130/80

## 2016-11-10 ENCOUNTER — Other Ambulatory Visit (HOSPITAL_COMMUNITY)
Admission: RE | Admit: 2016-11-10 | Discharge: 2016-11-10 | Disposition: A | Discharge status: Home / Self Care | Payer: 59 | Source: Ambulatory Visit | Attending: Gynecology | Admitting: Gynecology

## 2016-11-10 DIAGNOSIS — C569 Malignant neoplasm of unspecified ovary: Secondary | ICD-10-CM | POA: Diagnosis not present

## 2016-11-10 LAB — URINALYSIS, ROUTINE W REFLEX MICROSCOPIC
BILIRUBIN URINE: NEGATIVE
Glucose, UA: NEGATIVE mg/dL
Hgb urine dipstick: NEGATIVE
Ketones, ur: NEGATIVE mg/dL
LEUKOCYTES UA: NEGATIVE
NITRITE: NEGATIVE
PH: 8 (ref 5.0–8.0)
PROTEIN: NEGATIVE mg/dL
Specific Gravity, Urine: 1.01 (ref 1.005–1.030)

## 2016-11-10 LAB — CBC WITH DIFFERENTIAL/PLATELET
Basophils Absolute: 0 10*3/uL (ref 0.0–0.1)
Basophils Relative: 0 %
EOS PCT: 3 %
Eosinophils Absolute: 0.2 10*3/uL (ref 0.0–0.7)
HCT: 25.7 % — ABNORMAL LOW (ref 36.0–46.0)
Hemoglobin: 8.4 g/dL — ABNORMAL LOW (ref 12.0–15.0)
LYMPHS ABS: 0.5 10*3/uL — AB (ref 0.7–4.0)
LYMPHS PCT: 8 %
MCH: 34.1 pg — AB (ref 26.0–34.0)
MCHC: 32.7 g/dL (ref 30.0–36.0)
MCV: 104.5 fL — AB (ref 78.0–100.0)
MONO ABS: 0.6 10*3/uL (ref 0.1–1.0)
Monocytes Relative: 10 %
Neutro Abs: 5.1 10*3/uL (ref 1.7–7.7)
Neutrophils Relative %: 80 %
PLATELETS: 51 10*3/uL — AB (ref 150–400)
RBC: 2.46 MIL/uL — ABNORMAL LOW (ref 3.87–5.11)
RDW: 16.3 % — AB (ref 11.5–15.5)
WBC: 6.3 10*3/uL (ref 4.0–10.5)

## 2016-11-10 LAB — COMPREHENSIVE METABOLIC PANEL
ALT: 21 U/L (ref 14–54)
ANION GAP: 10 (ref 5–15)
AST: 17 U/L (ref 15–41)
Albumin: 4 g/dL (ref 3.5–5.0)
Alkaline Phosphatase: 95 U/L (ref 38–126)
BILIRUBIN TOTAL: 0.4 mg/dL (ref 0.3–1.2)
BUN: 18 mg/dL (ref 6–20)
CO2: 26 mmol/L (ref 22–32)
Calcium: 9.2 mg/dL (ref 8.9–10.3)
Chloride: 102 mmol/L (ref 101–111)
Creatinine, Ser: 1.68 mg/dL — ABNORMAL HIGH (ref 0.44–1.00)
GFR calc Af Amer: 40 mL/min — ABNORMAL LOW (ref >60)
GFR calc non Af Amer: 34 mL/min — ABNORMAL LOW (ref >60)
GLUCOSE: 113 mg/dL — AB (ref 65–99)
POTASSIUM: 4.9 mmol/L (ref 3.5–5.1)
Sodium: 138 mmol/L (ref 135–145)
TOTAL PROTEIN: 6.9 g/dL (ref 6.5–8.1)

## 2016-11-10 LAB — MAGNESIUM: MAGNESIUM: 1.6 mg/dL — AB (ref 1.7–2.4)

## 2016-11-13 ENCOUNTER — Ambulatory Visit
Admission: RE | Admit: 2016-11-13 | Discharge: 2016-11-13 | Disposition: A | Discharge status: Home / Self Care | Payer: Commercial Managed Care - PPO

## 2016-11-13 ENCOUNTER — Ambulatory Visit
Admission: RE | Admit: 2016-11-13 | Discharge: 2016-11-13 | Disposition: A | Discharge status: Home / Self Care | Payer: Commercial Managed Care - PPO | Attending: Gynecologic Oncology | Admitting: Gynecologic Oncology

## 2016-11-13 DIAGNOSIS — C569 Malignant neoplasm of unspecified ovary: Principal | ICD-10-CM

## 2016-11-13 DIAGNOSIS — E039 Hypothyroidism, unspecified: Secondary | ICD-10-CM | POA: Diagnosis not present

## 2016-11-13 DIAGNOSIS — Z880 Allergy status to penicillin: Secondary | ICD-10-CM | POA: Diagnosis not present

## 2016-11-13 DIAGNOSIS — I10 Essential (primary) hypertension: Secondary | ICD-10-CM | POA: Diagnosis not present

## 2016-11-13 DIAGNOSIS — E669 Obesity, unspecified: Secondary | ICD-10-CM | POA: Diagnosis not present

## 2016-11-13 DIAGNOSIS — Z90722 Acquired absence of ovaries, bilateral: Secondary | ICD-10-CM | POA: Diagnosis not present

## 2016-11-13 DIAGNOSIS — K660 Peritoneal adhesions (postprocedural) (postinfection): Secondary | ICD-10-CM | POA: Diagnosis not present

## 2016-11-13 DIAGNOSIS — R5383 Other fatigue: Secondary | ICD-10-CM | POA: Diagnosis not present

## 2016-11-13 DIAGNOSIS — Z90710 Acquired absence of both cervix and uterus: Secondary | ICD-10-CM | POA: Diagnosis not present

## 2016-11-26 ENCOUNTER — Other Ambulatory Visit: Payer: Self-pay | Admitting: *Deleted

## 2016-11-26 ENCOUNTER — Ambulatory Visit
Admission: RE | Admit: 2016-11-26 | Discharge: 2016-11-26 | Disposition: A | Discharge status: Home / Self Care | Payer: Commercial Managed Care - PPO

## 2016-11-26 DIAGNOSIS — C569 Malignant neoplasm of unspecified ovary: Principal | ICD-10-CM

## 2016-11-26 DIAGNOSIS — R918 Other nonspecific abnormal finding of lung field: Secondary | ICD-10-CM | POA: Diagnosis not present

## 2016-11-26 DIAGNOSIS — Z9889 Other specified postprocedural states: Secondary | ICD-10-CM | POA: Diagnosis not present

## 2016-11-26 DIAGNOSIS — C562 Malignant neoplasm of left ovary: Secondary | ICD-10-CM | POA: Diagnosis not present

## 2016-11-26 DIAGNOSIS — N9489 Other specified conditions associated with female genital organs and menstrual cycle: Secondary | ICD-10-CM | POA: Diagnosis not present

## 2016-11-26 DIAGNOSIS — R911 Solitary pulmonary nodule: Secondary | ICD-10-CM | POA: Diagnosis not present

## 2016-11-26 DIAGNOSIS — C561 Malignant neoplasm of right ovary: Secondary | ICD-10-CM | POA: Diagnosis not present

## 2016-11-26 DIAGNOSIS — R935 Abnormal findings on diagnostic imaging of other abdominal regions, including retroperitoneum: Secondary | ICD-10-CM | POA: Diagnosis not present

## 2016-11-26 NOTE — Patient Outreach (Signed)
[place] [facility]) Care Management  11/26/2016  Kayla Kelley February 16, 1965 [id number]  Subjective: Telephone call to patient's home  / mobile number, no answer, left HIPAA compliant voicemail message, and requested call back.    Objective: Patient hospitalized 10/23/16 - 10/25/16 for hyponatremia at [facility].  Per KPN (point of care tool), chart review, and update from [facility] CM call on 08/14/16. 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 New chemo of Cisplatin, gemcitabine, bevacizumab initiated. Current status: Stable, OP chemo and periodic surveillance scans. She is otherwise asymptomatic. Member has declined need for CM at this timefrom [name].   [facility] Care Management closed case on 09/09/16 due to unable to reach patient.    Assessment: Received [facility] Transition on care referral on 10/28/16.   Transition of care follow up pending patient contact.     Plan: RNCM will call patient for 2nd telephone outreach attempt, transition of care follow up, within 10 business days if no return call.      Alicia Ackert H. Annia Friendly, BSN, [place] Management [facility] Telephonic CM Phone: [phone] Fax: [phone]

## 2016-11-27 ENCOUNTER — Other Ambulatory Visit: Payer: Self-pay | Admitting: *Deleted

## 2016-11-27 ENCOUNTER — Encounter: Payer: Self-pay | Admitting: Family Medicine

## 2016-11-27 MED ORDER — LEVOTHYROXINE SODIUM 50 MCG PO TABS: 50.0000 ug | ORAL_TABLET | Freq: Every day | ORAL | 1 refills | Status: AC

## 2016-11-27 NOTE — Patient Outreach (Signed)
[facility]) Care Management  11/27/2016  Kayla Kelley Mar 21, 1965 [id number]   Subjective: Telephone call to patient's home  / mobile number, no answer, left HIPAA compliant voicemail message, and requested call back.    Objective: Patient hospitalized 10/23/16 - 10/25/16 for hyponatremia at [facility].  Per KPN (point of care tool), chart review, and update from [facility] CM call on 08/14/16. 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 New chemo of Cisplatin, gemcitabine, bevacizumab initiated. Current status: Stable, OP chemo and periodic surveillance scans. She is otherwise asymptomatic. Member has declined need for CM at this timefrom [name].   [facility] Care Management closed case on 09/09/16 due to unable to reach patient.    Assessment: Received [facility] Transition on care referral on 10/28/16.   Transition of care follow up pending patient contact.     Plan: RNCM will call patient for 3rd telephone outreach attempt, transition of care follow up, within 10 business days if no return call.      Margareta Laureano H. Annia Friendly, BSN, [place] Management [facility] Telephonic CM Phone: [phone] Fax: [phone]

## 2016-11-28 ENCOUNTER — Other Ambulatory Visit: Payer: Self-pay | Admitting: *Deleted

## 2016-11-28 ENCOUNTER — Encounter: Payer: Self-pay | Admitting: *Deleted

## 2016-11-28 NOTE — Patient Outreach (Signed)
[facility]) Care Management  11/28/2016  Kayla Kelley 16-Mar-1965 [id number]   Subjective: Telephone call to patient's home / mobile number, no answer, left HIPAA compliant voicemail message, and requested call back.    Objective: Patient hospitalized 10/23/16 - 10/25/16 for hyponatremia at [facility]. Per KPN (point of care tool), chart review, and update from [facility] CM call on 08/14/16. 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 New chemo of Cisplatin, gemcitabine, bevacizumab initiated. Current status: Stable, OP chemo and periodic surveillance scans. She is otherwise asymptomatic. Member has declined need for CM at this timefrom [name]. [facility] Care Management closed case on 09/09/16 due to unable to reach patient.    Assessment: Received [facility] Transition on care referral on 10/28/16. Transition of care follow up pending patient contact.     Plan: RNCM will send unsuccessful outreach  letter, [facility] pamphlet, and proceed with case closure, within 10 business days if no return call.     Jayshun Galentine H. Annia Friendly, BSN, [place] Management [facility] Telephonic CM Phone: [phone] Fax: [phone]

## 2016-12-01 ENCOUNTER — Encounter: Payer: Self-pay | Admitting: Family Medicine

## 2016-12-01 MED FILL — LEVOTHYROXINE 50 MCG TABLET: 50 | 90 days supply | Qty: 90 | Fill #0

## 2016-12-03 ENCOUNTER — Ambulatory Visit (INDEPENDENT_AMBULATORY_CARE_PROVIDER_SITE_OTHER): Payer: 59 | Admitting: *Deleted

## 2016-12-03 DIAGNOSIS — Z23 Encounter for immunization: Secondary | ICD-10-CM | POA: Diagnosis not present

## 2016-12-09 MED ORDER — ETOPOSIDE 50 MG CAPSULE: 50 mg | each | 5 refills | 0 days

## 2016-12-09 MED ORDER — ETOPOSIDE 50 MG CAPSULE
ORAL_CAPSULE | Freq: Every day | ORAL | 5 refills | 0.00000 days | Status: CP
Start: 2016-12-09 — End: 2017-03-04

## 2016-12-09 NOTE — Unmapped (Signed)
[facility] Specialty Medication Referral: No PA required    Medication (Brand/Generic): Etoposide 50mg     Initial FSI Test Claim completed with resulted information below:  No PA required  Patient ABLE to fill at [facility]  Insurance Company:  [employer]  Anticipated Copay: $0    As Co-pay is under $100 defined limit, per policy there will be no further investigation of need for financial assistance at this time unless patient requests. This referral has been communicated to the provider and handed off to the [facility] team for further processing and filling of prescribed medication.   ______________________________________________________________________  Please utilize this referral for viewing purposes as it will serve as the central location for all relevant documentation and updates.

## 2016-12-13 ENCOUNTER — Other Ambulatory Visit: Payer: Self-pay | Admitting: *Deleted

## 2016-12-13 NOTE — Patient Outreach (Signed)
[facility]) Care Management  12/13/2016  Kayla Kelley 1965-11-08 [id number]   No response from patient outreach attempts will proceed with case closure.    Objective: Patient hospitalized 10/23/16 - 10/25/16 for hyponatremia at [facility]. Per KPN (point of care tool), chart review, and update from [facility] CM call on 08/14/16. 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 New chemo of Cisplatin, gemcitabine, bevacizumab initiated. Current status: Stable, OP chemo and periodic surveillance scans. She is otherwise asymptomatic. Member has declined need for CM at this timefrom [name]. [facility] Care Management closed case on 09/09/16 due to unable to reach patient.    Assessment: Received [facility]MR Transition on care referral on 10/28/16. Transition of care follow up not completed due to unable to contact patient and will proceed with case closure.    Plan: RNCM will send case closure due to unable to reach request to [name].     Diahann Guajardo H. Annia Friendly, BSN, [place] [facility] Telephonic CM Phone: [phone] Fax: [phone]

## 2016-12-18 ENCOUNTER — Ambulatory Visit
Admission: RE | Admit: 2016-12-18 | Discharge: 2016-12-18 | Disposition: A | Discharge status: Home / Self Care | Payer: Commercial Managed Care - PPO | Attending: Gynecologic Oncology | Admitting: Gynecologic Oncology

## 2016-12-18 ENCOUNTER — Ambulatory Visit
Admission: RE | Admit: 2016-12-18 | Discharge: 2016-12-18 | Disposition: A | Discharge status: Home / Self Care | Payer: Commercial Managed Care - PPO

## 2016-12-18 DIAGNOSIS — C569 Malignant neoplasm of unspecified ovary: Principal | ICD-10-CM

## 2016-12-18 DIAGNOSIS — G4733 Obstructive sleep apnea (adult) (pediatric): Secondary | ICD-10-CM | POA: Diagnosis not present

## 2016-12-18 DIAGNOSIS — E669 Obesity, unspecified: Secondary | ICD-10-CM | POA: Diagnosis not present

## 2016-12-18 DIAGNOSIS — Z9221 Personal history of antineoplastic chemotherapy: Secondary | ICD-10-CM | POA: Diagnosis not present

## 2016-12-18 DIAGNOSIS — R918 Other nonspecific abnormal finding of lung field: Secondary | ICD-10-CM | POA: Diagnosis not present

## 2016-12-18 DIAGNOSIS — R1902 Left upper quadrant abdominal swelling, mass and lump: Secondary | ICD-10-CM | POA: Diagnosis not present

## 2016-12-18 DIAGNOSIS — E039 Hypothyroidism, unspecified: Secondary | ICD-10-CM | POA: Diagnosis not present

## 2016-12-18 DIAGNOSIS — N9489 Other specified conditions associated with female genital organs and menstrual cycle: Secondary | ICD-10-CM | POA: Diagnosis not present

## 2016-12-18 DIAGNOSIS — I10 Essential (primary) hypertension: Secondary | ICD-10-CM | POA: Diagnosis not present

## 2016-12-18 LAB — COMPREHENSIVE METABOLIC PANEL
ALBUMIN: 3.7 g/dL (ref 3.5–5.0)
ALKALINE PHOSPHATASE: 71 U/L (ref 38–126)
ALT (SGPT): 27 U/L (ref 15–48)
ANION GAP: 8 mmol/L — ABNORMAL LOW (ref 9–15)
AST (SGOT): 17 U/L (ref 14–38)
BILIRUBIN TOTAL: 0.4 mg/dL (ref 0.0–1.2)
BLOOD UREA NITROGEN: 17 mg/dL (ref 7–21)
BUN / CREAT RATIO: 14
CALCIUM: 8.8 mg/dL (ref 8.5–10.2)
CHLORIDE: 103 mmol/L (ref 98–107)
EGFR MDRD AF AMER: 55 mL/min/{1.73_m2} — ABNORMAL LOW (ref >=60)
EGFR MDRD NON AF AMER: 46 mL/min/{1.73_m2} — ABNORMAL LOW (ref >=60)
GLUCOSE RANDOM: 101 mg/dL (ref 65–179)
POTASSIUM: 4.4 mmol/L (ref 3.5–5.0)
PROTEIN TOTAL: 6.6 g/dL (ref 6.5–8.3)
SODIUM: 136 mmol/L (ref 135–145)

## 2016-12-18 LAB — CA 125: Cancer Ag 125:ACnc:Pt:Ser/Plas:Qn:: 825 — ABNORMAL HIGH

## 2016-12-18 LAB — SMEAR REVIEW

## 2016-12-18 LAB — CBC W/ AUTO DIFF
BASOPHILS ABSOLUTE COUNT: 0 10*9/L (ref 0.0–0.1)
EOSINOPHILS ABSOLUTE COUNT: 0.3 10*9/L (ref 0.0–0.4)
HEMATOCRIT: 28.8 % — ABNORMAL LOW (ref 36.0–46.0)
HEMOGLOBIN: 8.9 g/dL — ABNORMAL LOW (ref 12.0–16.0)
LARGE UNSTAINED CELLS: 2 % (ref 0–4)
LYMPHOCYTES ABSOLUTE COUNT: 0.5 10*9/L — ABNORMAL LOW (ref 1.5–5.0)
MEAN CORPUSCULAR HEMOGLOBIN CONC: 31.1 g/dL (ref 31.0–37.0)
MEAN CORPUSCULAR HEMOGLOBIN: 31.3 pg (ref 26.0–34.0)
MEAN CORPUSCULAR VOLUME: 100.7 fL — ABNORMAL HIGH (ref 80.0–100.0)
MEAN PLATELET VOLUME: 7.9 fL (ref 7.0–10.0)
MONOCYTES ABSOLUTE COUNT: 0.4 10*9/L (ref 0.2–0.8)
RED CELL DISTRIBUTION WIDTH: 16.8 % — ABNORMAL HIGH (ref 12.0–15.0)
WBC ADJUSTED: 5.5 10*9/L (ref 4.5–11.0)

## 2016-12-18 LAB — HEMOGLOBIN: Lab: 8.9 — ABNORMAL LOW

## 2016-12-18 LAB — MAGNESIUM: Magnesium:MCnc:Pt:Ser/Plas:Qn:: 1.7

## 2016-12-18 LAB — PROTEIN TOTAL: Protein:MCnc:Pt:Ser/Plas:Qn:: 6.6

## 2016-12-18 NOTE — Unmapped (Signed)
Begin taking oral etoposide 50 mg daily for 2 weeks and then take 1 week off of therapy. Return to see [place] in 3 weeks.

## 2016-12-18 NOTE — Unmapped (Signed)
Patient presents to clinic today for port flush and lab draw. Port accessed with 20g 3/4 inch needle. Patient's port flushed without difficulty, brisk blood return noted. Labs drawn. Port flushed with 20cc NS and 5cc Heparin. Patient de-accessed, gauze and tape applied.  Patient denies any questions at this time.  Harlin Heys

## 2016-12-18 NOTE — Unmapped (Signed)
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       Plan:   Given the continued rise in her CA-125 values and new disease found on recent CT scan, we discussed options for further management. Pros and cons of several different chemotherapy regimens were discussed and we've settled on beginning oral etoposide 50 mg daily for 2 weeks with 1 week off of therapy. She return in 3 weeks to see if fellow and [facility]. I'll plan on seeing the patient in 6 weeks.    .  Interval History  The patient returns today for evaluation of ongoing therapy. Unfortunately, her CA-125 value has been gradually rising  most recently her CA-125 was 320 units per mL. CT scan shows:  Impression       -- New omental nodularity in the left upper quadrant and nodularity along the peritoneal reflection, suspicious for peritoneal and omental metastasis. Short interval follow-up is suggested.    --Unchanged subcentimeter pulmonary nodules.    --Interval resolution of previously noted retroperitoneal and common iliac lymph nodes. There is an unchanged 1.8 cm lymph node or soft tissue focus along the right external iliac artery.    --Unchanged 3 cm right pelvic cystic lesion, likely a postsurgical seroma or lymphocele   The patient herself is entirely asymptomatic. She specifically denies any GI GU or pelvic symptoms. Her functional status is excellent.     History of Present Illness: The patient initially presented with several months of increasing abdominal girth and a pelvic mass measuring 18 x 15 x 19 cm. She had multiple retroperitoneal lymph nodes and a 9 cm right-sided benign hepatic cyst. Preoperative CA-125 was 508 units per mL. September 21, 2015 the patient underwent exploratory laparotomy and radical debulking of a stage III C ovarian cancer. Surgery included total abdominal hysterectomy, bilateral salpingo-oophorectomy, extensive lysis of adhesions and ureteral lysis of the right ureter, right pelvic lymphadenectomy and right periodic lymphadenectomy and omentectomy. At the completion of the surgical procedure the only residual disease were small lymph nodes and the high periaortic region that were unresectable. The patient had an uncomplicated postoperative course.    First cycle of carboplatin and Taxol was administered on September 27, 2015. Initially she had a Taxol reaction but when rechallenged did well. She completed 6 cycles of carboplatin and Taxol in December 2017. At that time her CA-125 value was 76 units per mL. But quickly rose to 94 units per mL.    Follow-up CT scan showed persistent disease in the periaortic chain.    The patient received periaortic and left pelvic sidewall radiation therapy completed May 12, 2016. She tolerated therapy well.    Unfortunately, shortly after completing radiation therapy her CA-125 rose to 444 units per mL. (Previously 94 units per mL)  We reinitiated second line chemotherapy in July 2018 using gemcitabine, cisplatin, and a Avastin. She had an initial response this regimen but then had progressive disease documented a rising CA-125 values November 13, 2016.     A  CT scan in November showing new areas of carcinomatosis. .    Impression       -- New omental nodularity in the left upper quadrant and nodularity along the peritoneal reflection, suspicious for peritoneal and omental metastasis. Short interval follow-up is suggested.    --Unchanged subcentimeter pulmonary nodules.    --Interval resolution of previously noted retroperitoneal and common iliac lymph nodes. There is an unchanged 1.8 cm lymph node or  soft tissue focus along the right external iliac artery.    --Unchanged 3 cm right pelvic cystic lesion, likely a postsurgical seroma or lymphocele     We then initiated therapy using oral etoposide    Review of Systems:  10 point review of systems is negative except as noted in Interval History.    Physical Exam:  BP 150/94  - Pulse 98  - Temp 36.6 ??C (97.9 ??F) (Oral)  - Resp 18  - Ht 160 cm (5' 2.99)  - Wt 88.7 kg (195 lb 8.8 oz)  - SpO2 99%  - BMI 34.65 kg/m??     Heent: Normocephalic, EOM intact, Hearing normal    Neck:  Supple without thryromegally    Lymph Nodes:  Supraclavicular and inguinal nodes normal    Abdomen:  Soft, nontender, No masses, organomegally, ascites or hernias, midline incisions healing well.    Pelvic:  EGBUS: Normal  Vagina:  Normal, no lesions, cuff well healed  Cervix:  Surgically absent  Uterus:  Surgically absent  Adenxa:  No masses, nodularity or tenderness  Rectal: normal sphincter tone, no masses    Extermities:  Normal, no varicosities or edema      Past Medical History:   Diagnosis Date   ??? Anemia    ??? GERD (gastroesophageal reflux disease)    ??? Hidradenitis suppurativa    ??? Hypertension    ??? Hypothyroid    ??? Narcolepsy without cataplexy(347.00)    ??? Obesity (BMI 30-39.9)    ??? Sciatica    ??? Sleep apnea     mild OSA; no CPAP was needed.    ??? Tachycardia      Past Surgical History:   Procedure Laterality Date   ??? IR INSERT PORT AGE GREATER THAN 5 YRS  09/21/2015    IR INSERT PORT AGE GREATER THAN 5 YRS 09/21/2015 Jobe Gibbon, MD IMG VIR MM MMNT   ??? PR RELEASE URETER,RETROPER FIBROSIS Right 09/11/2015    Procedure: Ureterolysis, With Or Without Repositioning Of Ureter For Retroperitoneal Fibrosis;  Surgeon: Roxan Diesel, MD;  Location: MAIN OR [facility];  Service: Gynecology Oncology   ??? PR RESEC OVAR MALIG+TAH+PELV NODES Bilateral 09/11/2015    Procedure: Resection(Initial) Ovarian/Tubal/Prima Pertonl Malig W/Bil S&O, Omntect; W/Tah/Ltd Para-Aortic Lymphadenect;  Surgeon: Roxan Diesel, MD;  Location: MAIN OR [facility];  Service: Gynecology Oncology   ??? WISDOM TOOTH EXTRACTION       Allergies   Allergen Reactions   ??? Ampicillin Hives     Remote Hx of ampicillins     Ms. Krinke had no medications administered during this visit.  Family History   Problem Relation Age of Onset   ??? Coronary artery disease Mother ??? Diabetes Mother    ??? Diabetes Father    ??? Lung cancer Father    ??? Clotting disorder Neg Hx    ??? Anesthesia problems Neg Hx      Social History     Social History   ??? Marital status: Married     Spouse name: N/A   ??? Number of children: N/A   ??? Years of education: N/A     Occupational History   ??? Not on file.     Social History Main Topics   ??? Smoking status: Never Smoker   ??? Smokeless tobacco: Never Used   ??? Alcohol use No   ??? Drug use: No   ??? Sexual activity: Not on file     Other Topics Concern   ???  Not on file     Social History Narrative   ??? No narrative on file

## 2016-12-19 MED ORDER — PROCHLORPERAZINE MALEATE 10 MG TABLET
ORAL_TABLET | Freq: Three times a day (TID) | ORAL | 2 refills | 0 days | Status: CP | PRN
Start: 2016-12-19 — End: ?

## 2016-12-19 MED FILL — PROCHLORPERAZINE 10 MG TAB: 10 | 10 days supply | Qty: 30 | Fill #0

## 2016-12-20 MED FILL — ETOPOSIDE/50MG/CAP: ETOPOSIDE/50MG/CAP | 28 days supply | Qty: 21 | Fill #0

## 2016-12-20 NOTE — Unmapped (Signed)
Addended by: Crecencio Mc on: 12/19/2016 05:40 PM     Modules accepted: Orders

## 2016-12-20 NOTE — Unmapped (Signed)
Patient Information: Sara Gaines is a 51 y.o. year-old female with recurrent platinum-resistant ovarian who I am counseling today on oral etoposide.     Pharmacy: [facility]    Confirmed Address and Phone number: yes    PMH: reviewed    Current medications: reviewed    Allergies: reviewed    Cultural assessment:   -Relevant cultural assessment and health literacy was completed.  Patient is able to store his/her medication as directed  -This patient does not have any cognitive or physical disabilities   -This patient does not speak any other languages.      Education points:  1. Adherence to oral chemotherapy was discussed. If the patient is having difficulty remembering to take her dose there are a number of strategies that we can use in order to help.     2. Food/drug Considerations: Etoposide can be taken with or without food.    3. The following adverse effects were discussed, including related supportive care recommendations and emergency situations:        A. Neutropenia   B. Anemia   C. Thrombocytopenia   D. Nausea/Vomiting   E. Alopecia   F. Mucositis    4. Drug Drug Interactions:  None noted on profile      Plan: Patient will receive and start oral etoposide on 12/23/16.    F/u: Will follow up after 1 week on the medication to assess labs and for toxicity.    I spent 40 minutes in direct patient care.    Hermelinda Medicus, PharmD, BCOP, CPP  Gynecologic Oncology Clinic Pharmacist  Pager: [phone]

## 2016-12-24 NOTE — Unmapped (Signed)
Specialty Medication Follow-up    Sara Gaines is a 51 y.o. female with recurrent ovarian cancer who I am seeing for follow up on their treatment with oral etoposide.     Chemotherapy: Etoposide 50 mg PO daily   Start date: 12/23/16    A/P:   1. Oral Chemotherapy: CBC w/diff and CMP reviewed. Grade 2 anemia which has improved and grade 1 serum creatinine elevation which has worsened. Despite grade 2 anemia will start oral etoposide and monitor CBC closely with weekly labs.  ?? Start etoposide 50 mg PO daily days 1-14 of 21 day cycle  ?? Obtain CBC w/diff in 1 week    I spent approximately 5 minutes in direct patient care.    Next follow up: In 1 week with lab results    Hermelinda Medicus, PharmD, BCOP, CPP  Gynecologic Oncology Clinic Pharmacist  Pager: [phone]    S/O: Sara Gaines was contacted regarding recent lab results. She is excited to start on oral etoposide and will call if she needs anything after starting.    Medications reviewed and updated in EPIC? no    Missed doses: N/A    Labs  No visits with results within 1 Day(s) from this visit.   Latest known visit with results is:   Infusion on 12/18/2016   Component Date Value Ref Range Status   ??? Sodium 12/18/2016 136  135 - 145 mmol/L Final   ??? Potassium 12/18/2016 4.4  3.5 - 5.0 mmol/L Final   ??? Chloride 12/18/2016 103  98 - 107 mmol/L Final   ??? CO2 12/18/2016 25.0  22.0 - 30.0 mmol/L Final   ??? BUN 12/18/2016 17  7 - 21 mg/dL Final   ??? Creatinine 12/18/2016 1.24* 0.60 - 1.00 mg/dL Final   ??? BUN/Creatinine Ratio 12/18/2016 14   Final   ??? EGFR MDRD Non Af Amer 12/18/2016 46* >=60 mL/min/1.21m2 Final   ??? EGFR MDRD Af Amer 12/18/2016 55* >=60 mL/min/1.41m2 Final   ??? Anion Gap 12/18/2016 8* 9 - 15 mmol/L Final   ??? Glucose 12/18/2016 101  65 - 179 mg/dL Final   ??? Calcium 45/40/9811 8.8  8.5 - 10.2 mg/dL Final   ??? Albumin 91/47/8295 3.7  3.5 - 5.0 g/dL Final   ??? Total Protein 12/18/2016 6.6  6.5 - 8.3 g/dL Final   ??? Total Bilirubin 12/18/2016 0.4  0.0 - 1.2 mg/dL Final   ??? AST 62/13/0865 17  14 - 38 U/L Final   ??? ALT 12/18/2016 27  15 - 48 U/L Final   ??? Alkaline Phosphatase 12/18/2016 71  38 - 126 U/L Final   ??? Magnesium 12/18/2016 1.7  1.6 - 2.2 mg/dL Final   ??? CA 784 69/62/9528 825.0* 0.0 - 34.9 U/mL Final   ??? WBC 12/18/2016 5.5  4.5 - 11.0 10*9/L Final   ??? RBC 12/18/2016 2.85* 4.00 - 5.20 10*12/L Final   ??? HGB 12/18/2016 8.9* 12.0 - 16.0 g/dL Final   ??? HCT 41/32/4401 28.8* 36.0 - 46.0 % Final   ??? MCV 12/18/2016 100.7* 80.0 - 100.0 fL Final   ??? MCH 12/18/2016 31.3  26.0 - 34.0 pg Final   ??? MCHC 12/18/2016 31.1  31.0 - 37.0 g/dL Final   ??? RDW 02/72/5366 16.8* 12.0 - 15.0 % Final   ??? MPV 12/18/2016 7.9  7.0 - 10.0 fL Final   ??? Platelet 12/18/2016 364  150 - 440 10*9/L Final   ??? Absolute Neutrophils 12/18/2016 4.2  2.0 - 7.5  10*9/L Final   ??? Absolute Lymphocytes 12/18/2016 0.5* 1.5 - 5.0 10*9/L Final   ??? Absolute Monocytes 12/18/2016 0.4  0.2 - 0.8 10*9/L Final   ??? Absolute Eosinophils 12/18/2016 0.3  0.0 - 0.4 10*9/L Final   ??? Absolute Basophils 12/18/2016 0.0  0.0 - 0.1 10*9/L Final   ??? Large Unstained Cells 12/18/2016 2  0 - 4 % Final   ??? Macrocytosis 12/18/2016 Moderate* Not Present Final   ??? Anisocytosis 12/18/2016 Slight* Not Present Final   ??? Hypochromasia 12/18/2016 Marked* Not Present Final   ??? Smear Review Comments 12/18/2016 See Comment* Undefined Final    Slide reviewed.

## 2016-12-29 ENCOUNTER — Other Ambulatory Visit (HOSPITAL_COMMUNITY)
Admission: RE | Admit: 2016-12-29 | Discharge: 2016-12-29 | Disposition: A | Discharge status: Home / Self Care | Payer: 59 | Source: Ambulatory Visit | Attending: Gynecology | Admitting: Gynecology

## 2016-12-29 DIAGNOSIS — C569 Malignant neoplasm of unspecified ovary: Secondary | ICD-10-CM | POA: Insufficient documentation

## 2016-12-29 LAB — COMPREHENSIVE METABOLIC PANEL
ALBUMIN: 3.5 g/dL (ref 3.5–5.0)
ALK PHOS: 71 U/L (ref 38–126)
ALT: 13 U/L — ABNORMAL LOW (ref 14–54)
ANION GAP: 8 (ref 5–15)
AST: 15 U/L (ref 15–41)
BILIRUBIN TOTAL: 0.3 mg/dL (ref 0.3–1.2)
BUN: 19 mg/dL (ref 6–20)
CALCIUM: 8.5 mg/dL — AB (ref 8.9–10.3)
CO2: 24 mmol/L (ref 22–32)
Chloride: 101 mmol/L (ref 101–111)
Creatinine, Ser: 1.21 mg/dL — ABNORMAL HIGH (ref 0.44–1.00)
GFR calc non Af Amer: 51 mL/min — ABNORMAL LOW (ref >60)
GFR, EST AFRICAN AMERICAN: 59 mL/min — AB (ref >60)
Glucose, Bld: 92 mg/dL (ref 65–99)
POTASSIUM: 4.2 mmol/L (ref 3.5–5.1)
Sodium: 133 mmol/L — ABNORMAL LOW (ref 135–145)
TOTAL PROTEIN: 7 g/dL (ref 6.5–8.1)

## 2016-12-29 LAB — CBC WITH DIFFERENTIAL/PLATELET
BASOS PCT: 1 %
Basophils Absolute: 0 10*3/uL (ref 0.0–0.1)
EOS ABS: 0.2 10*3/uL (ref 0.0–0.7)
Eosinophils Relative: 4 %
HEMATOCRIT: 29.6 % — AB (ref 36.0–46.0)
HEMOGLOBIN: 9.4 g/dL — AB (ref 12.0–15.0)
LYMPHS ABS: 0.6 10*3/uL — AB (ref 0.7–4.0)
Lymphocytes Relative: 12 %
MCH: 31.1 pg (ref 26.0–34.0)
MCHC: 31.8 g/dL (ref 30.0–36.0)
MCV: 98 fL (ref 78.0–100.0)
MONO ABS: 0.2 10*3/uL (ref 0.1–1.0)
MONOS PCT: 3 %
NEUTROS ABS: 4.3 10*3/uL (ref 1.7–7.7)
NEUTROS PCT: 80 %
Platelets: 270 10*3/uL (ref 150–400)
RBC: 3.02 MIL/uL — ABNORMAL LOW (ref 3.87–5.11)
RDW: 15.5 % (ref 11.5–15.5)
WBC: 5.3 10*3/uL (ref 4.0–10.5)

## 2016-12-29 LAB — MAGNESIUM: MAGNESIUM: 1.7 mg/dL (ref 1.7–2.4)

## 2017-01-01 NOTE — Unmapped (Signed)
Specialty Medication Follow-up    Sara Gaines is a 51 y.o. female with recurrent ovarian cancer who I am seeing for follow up on their treatment with oral etoposide.     Chemotherapy: Etoposide 50 mg PO daily   Start date: 12/23/16    A/P:   1. Oral Chemotherapy: CBC w/diff and CMP reviewed. Grade 2 anemia which has improved. Grade 1 serum creatinine elevation, grade 1 nausea, and grade 1 fatigue all of which are stable. No grade 3 toxicity therefore will continue at current dose intensity. Will repeat labs in 1 week at her clinic appointment.   ?? Continue etoposide 50 mg PO daily days 1-14 of 21 day cycle  ?? Obtain CBC w/diff in 1 week    I spent approximately 10 minutes in direct patient care.    Next follow up: In 1 week at clinic appointment    Hermelinda Medicus, PharmD, BCOP, CPP  Gynecologic Oncology Clinic Pharmacist  Pager: [phone]    S/O: Sara Gaines was contacted regarding recent lab results. She reports feeling nauseated but is taking prochlorperazine for this which is controlling the nausea. She denies any vomiting at this point in time. She has fatigue but this is only impacting her life after a busy active day. She is not excessively fatigues on a daily basis.     Medications reviewed and updated in EPIC? no    Missed doses: None    Labs  No visits with results within 1 Day(s) from this visit.   Latest known visit with results is:   Infusion on 12/18/2016   Component Date Value Ref Range Status   ??? Sodium 12/18/2016 136  135 - 145 mmol/L Final   ??? Potassium 12/18/2016 4.4  3.5 - 5.0 mmol/L Final   ??? Chloride 12/18/2016 103  98 - 107 mmol/L Final   ??? CO2 12/18/2016 25.0  22.0 - 30.0 mmol/L Final   ??? BUN 12/18/2016 17  7 - 21 mg/dL Final   ??? Creatinine 12/18/2016 1.24* 0.60 - 1.00 mg/dL Final   ??? BUN/Creatinine Ratio 12/18/2016 14   Final   ??? EGFR MDRD Non Af Amer 12/18/2016 46* >=60 mL/min/1.57m2 Final   ??? EGFR MDRD Af Amer 12/18/2016 55* >=60 mL/min/1.102m2 Final   ??? Anion Gap 12/18/2016 8* 9 - 15 mmol/L Final   ??? Glucose 12/18/2016 101  65 - 179 mg/dL Final   ??? Calcium 82/95/6213 8.8  8.5 - 10.2 mg/dL Final   ??? Albumin 08/65/7846 3.7  3.5 - 5.0 g/dL Final   ??? Total Protein 12/18/2016 6.6  6.5 - 8.3 g/dL Final   ??? Total Bilirubin 12/18/2016 0.4  0.0 - 1.2 mg/dL Final   ??? AST 96/29/5284 17  14 - 38 U/L Final   ??? ALT 12/18/2016 27  15 - 48 U/L Final   ??? Alkaline Phosphatase 12/18/2016 71  38 - 126 U/L Final   ??? Magnesium 12/18/2016 1.7  1.6 - 2.2 mg/dL Final   ??? CA 132 44/02/270 825.0* 0.0 - 34.9 U/mL Final   ??? WBC 12/18/2016 5.5  4.5 - 11.0 10*9/L Final   ??? RBC 12/18/2016 2.85* 4.00 - 5.20 10*12/L Final   ??? HGB 12/18/2016 8.9* 12.0 - 16.0 g/dL Final   ??? HCT 53/66/4403 28.8* 36.0 - 46.0 % Final   ??? MCV 12/18/2016 100.7* 80.0 - 100.0 fL Final   ??? MCH 12/18/2016 31.3  26.0 - 34.0 pg Final   ??? MCHC 12/18/2016 31.1  31.0 - 37.0 g/dL  Final   ??? RDW 12/18/2016 16.8* 12.0 - 15.0 % Final   ??? MPV 12/18/2016 7.9  7.0 - 10.0 fL Final   ??? Platelet 12/18/2016 364  150 - 440 10*9/L Final   ??? Absolute Neutrophils 12/18/2016 4.2  2.0 - 7.5 10*9/L Final   ??? Absolute Lymphocytes 12/18/2016 0.5* 1.5 - 5.0 10*9/L Final   ??? Absolute Monocytes 12/18/2016 0.4  0.2 - 0.8 10*9/L Final   ??? Absolute Eosinophils 12/18/2016 0.3  0.0 - 0.4 10*9/L Final   ??? Absolute Basophils 12/18/2016 0.0  0.0 - 0.1 10*9/L Final   ??? Large Unstained Cells 12/18/2016 2  0 - 4 % Final   ??? Macrocytosis 12/18/2016 Moderate* Not Present Final   ??? Anisocytosis 12/18/2016 Slight* Not Present Final   ??? Hypochromasia 12/18/2016 Marked* Not Present Final   ??? Smear Review Comments 12/18/2016 See Comment* Undefined Final    Slide reviewed.

## 2017-01-08 ENCOUNTER — Ambulatory Visit
Admission: RE | Admit: 2017-01-08 | Discharge: 2017-01-08 | Disposition: A | Discharge status: Home / Self Care | Payer: Commercial Managed Care - PPO

## 2017-01-08 ENCOUNTER — Ambulatory Visit
Admission: RE | Admit: 2017-01-08 | Discharge: 2017-01-08 | Disposition: A | Discharge status: Home / Self Care | Payer: Commercial Managed Care - PPO | Attending: Gynecologic Oncology | Admitting: Gynecologic Oncology

## 2017-01-08 DIAGNOSIS — C569 Malignant neoplasm of unspecified ovary: Principal | ICD-10-CM

## 2017-01-08 LAB — CBC W/ AUTO DIFF
BASOPHILS ABSOLUTE COUNT: 0 10*9/L (ref 0.0–0.1)
EOSINOPHILS ABSOLUTE COUNT: 0.1 10*9/L (ref 0.0–0.4)
HEMATOCRIT: 28.2 % — ABNORMAL LOW (ref 36.0–46.0)
HEMOGLOBIN: 9 g/dL — ABNORMAL LOW (ref 12.0–16.0)
LARGE UNSTAINED CELLS: 2 % (ref 0–4)
LYMPHOCYTES ABSOLUTE COUNT: 0.4 10*9/L — ABNORMAL LOW (ref 1.5–5.0)
MEAN CORPUSCULAR HEMOGLOBIN: 31.5 pg (ref 26.0–34.0)
MEAN CORPUSCULAR VOLUME: 98.5 fL (ref 80.0–100.0)
MEAN PLATELET VOLUME: 8.9 fL (ref 7.0–10.0)
MONOCYTES ABSOLUTE COUNT: 0.1 10*9/L — ABNORMAL LOW (ref 0.2–0.8)
NEUTROPHILS ABSOLUTE COUNT: 3 10*9/L (ref 2.0–7.5)
RED BLOOD CELL COUNT: 2.86 10*12/L — ABNORMAL LOW (ref 4.00–5.20)
RED CELL DISTRIBUTION WIDTH: 18.2 % — ABNORMAL HIGH (ref 12.0–15.0)
WBC ADJUSTED: 3.6 10*9/L — ABNORMAL LOW (ref 4.5–11.0)

## 2017-01-08 LAB — COMPREHENSIVE METABOLIC PANEL
ALBUMIN: 4 g/dL (ref 3.5–5.0)
ALKALINE PHOSPHATASE: 79 U/L (ref 38–126)
ALT (SGPT): 27 U/L (ref 15–48)
BILIRUBIN TOTAL: 0.6 mg/dL (ref 0.0–1.2)
BLOOD UREA NITROGEN: 18 mg/dL (ref 7–21)
BUN / CREAT RATIO: 13
CALCIUM: 9.5 mg/dL (ref 8.5–10.2)
CHLORIDE: 97 mmol/L — ABNORMAL LOW (ref 98–107)
CO2: 30 mmol/L (ref 22.0–30.0)
CREATININE: 1.4 mg/dL — ABNORMAL HIGH (ref 0.60–1.00)
EGFR MDRD AF AMER: 48 mL/min/{1.73_m2} — ABNORMAL LOW (ref >=60)
EGFR MDRD NON AF AMER: 40 mL/min/{1.73_m2} — ABNORMAL LOW (ref >=60)
GLUCOSE RANDOM: 108 mg/dL (ref 65–179)
POTASSIUM: 4.2 mmol/L (ref 3.5–5.0)
PROTEIN TOTAL: 6.6 g/dL (ref 6.5–8.3)
SODIUM: 139 mmol/L (ref 135–145)

## 2017-01-08 LAB — MEAN CORPUSCULAR HEMOGLOBIN: Lab: 31.5

## 2017-01-08 LAB — ANION GAP: Anion gap 3:SCnc:Pt:Ser/Plas:Qn:: 12

## 2017-01-08 NOTE — Unmapped (Signed)
Port accessed without difficulty with brisk blood return noted.  Ordered labs drawn and sent to lab. Port flushed and needle removed as per standard.

## 2017-01-09 NOTE — Unmapped (Signed)
[facility] Specialty Pharmacy Refill Coordination Note  Specialty Medication(s): Etoposide 50mg     Additional Medications shipped: LEIANA RUND, DOB: March 05, 1965  Phone: [phone] (home) , Alternate phone contact: N/A  Phone or address changes today?: No  All above HIPAA information was verified with patient.  Shipping Address: [address]  [place] [address]   Insurance changes? No    Completed refill call assessment today to schedule patient's medication shipment from the [facility] [phone]).      Confirmed the medication and dosage are correct and have not changed: Yes, regimen is correct and unchanged.    Confirmed patient started or stopped the following medications in the past month:  No, there are no changes reported at this time.    Are you tolerating your medication?:  Adair reports side effects of heartburn.  She said it was better drinking milk with it.  I suggested increasing her intake of water and to contact us if heartburn continues..    ADHERENCE    Is this medicine transplant or covered by Medicare Part B? No.        Did you miss any doses in the past 4 weeks? No missed doses reported.    FINANCIAL/SHIPPING    Delivery Scheduled: Yes, Expected medication delivery date: 01/16/17     Rocquel did not have any additional questions at this time.    Delivery address validated in [facility]SI scheduling system: Yes, address listed in FSI is correct.    We will follow up with patient monthly for standard refill processing and delivery.      Thank you,  [employer]   [facility] Specialty Pharmacist

## 2017-01-10 MED FILL — ETOPOSIDE/50MG/CAP: ETOPOSIDE/50MG/CAP | 28 days supply | Qty: 21 | Fill #1

## 2017-01-10 NOTE — Unmapped (Signed)
Specialty Medication Follow-up    Sara Gaines is a 51 y.o. female with recurrent ovarian cancer who I am seeing for follow up on their treatment with oral etoposide.     Chemotherapy: Etoposide 50 mg PO daily   Start date: Cycle 2 Day 1 (01/13/17)    A/P:   1. Oral Chemotherapy: CBC w/diff and CMP reviewed. Grade 1 serum creatinine elevation, grade 1 white blood count decreased, and grade 2 anemia all of which have worsened. No grade 3 toxicities present therefore will continue with current treatment plan. Will repeat labs in 2 weeks after 1 week on cycle 2 to assess toxicity.   ?? Continue etoposide 50 mg PO daily days 1-14 of 21 day cycle  ?? Obtain CBC w/diff in 1 week    I spent approximately 20 minutes in direct patient care.    Next follow up: In 2 weeks with lab results    Hermelinda Medicus, PharmD, BCOP, CPP  Gynecologic Oncology Clinic Pharmacist  Pager: [phone]    S/O: Sara Gaines presents to clinic following cycle 1 oral etoposide. She reports overall feeling well especially in comparison to other chemotherapies she has received. She endorses some fatigue but states that this is tolerable. She takes breaks throughout the day and is able to complete daily activities. She has had a change in appetite but is still able to maintain good oral intake. She has been taken prochlorperazine prior to her dose of etoposide to avoid nausea and this has worked with with minimal nausea present and no episodes of emesis.     Medications reviewed and updated in EPIC? no    Missed doses: None    Labs  Infusion on 01/08/2017   Component Date Value Ref Range Status   ??? Sodium 01/08/2017 139  135 - 145 mmol/L Final   ??? Potassium 01/08/2017 4.2  3.5 - 5.0 mmol/L Final   ??? Chloride 01/08/2017 97* 98 - 107 mmol/L Final   ??? CO2 01/08/2017 30.0  22.0 - 30.0 mmol/L Final   ??? BUN 01/08/2017 18  7 - 21 mg/dL Final   ??? Creatinine 01/08/2017 1.40* 0.60 - 1.00 mg/dL Final   ??? BUN/Creatinine Ratio 01/08/2017 13   Final   ??? EGFR MDRD Non Af Amer 01/08/2017 40* >=60 mL/min/1.71m2 Final   ??? EGFR MDRD Af Amer 01/08/2017 48* >=60 mL/min/1.30m2 Final   ??? Anion Gap 01/08/2017 12  9 - 15 mmol/L Final   ??? Glucose 01/08/2017 108  65 - 179 mg/dL Final   ??? Calcium 45/40/9811 9.5  8.5 - 10.2 mg/dL Final   ??? Albumin 91/47/8295 4.0  3.5 - 5.0 g/dL Final   ??? Total Protein 01/08/2017 6.6  6.5 - 8.3 g/dL Final   ??? Total Bilirubin 01/08/2017 0.6  0.0 - 1.2 mg/dL Final   ??? AST 62/13/0865 17  14 - 38 U/L Final   ??? ALT 01/08/2017 27  15 - 48 U/L Final   ??? Alkaline Phosphatase 01/08/2017 79  38 - 126 U/L Final   ??? WBC 01/08/2017 3.6* 4.5 - 11.0 10*9/L Final   ??? RBC 01/08/2017 2.86* 4.00 - 5.20 10*12/L Final   ??? HGB 01/08/2017 9.0* 12.0 - 16.0 g/dL Final   ??? HCT 78/46/9629 28.2* 36.0 - 46.0 % Final   ??? MCV 01/08/2017 98.5  80.0 - 100.0 fL Final   ??? MCH 01/08/2017 31.5  26.0 - 34.0 pg Final   ??? MCHC 01/08/2017 32.0  31.0 - 37.0 g/dL Final   ???  RDW 01/08/2017 18.2* 12.0 - 15.0 % Final   ??? MPV 01/08/2017 8.9  7.0 - 10.0 fL Final   ??? Platelet 01/08/2017 269  150 - 440 10*9/L Final   ??? Absolute Neutrophils 01/08/2017 3.0  2.0 - 7.5 10*9/L Final   ??? Absolute Lymphocytes 01/08/2017 0.4* 1.5 - 5.0 10*9/L Final   ??? Absolute Monocytes 01/08/2017 0.1* 0.2 - 0.8 10*9/L Final   ??? Absolute Eosinophils 01/08/2017 0.1  0.0 - 0.4 10*9/L Final   ??? Absolute Basophils 01/08/2017 0.0  0.0 - 0.1 10*9/L Final   ??? Large Unstained Cells 01/08/2017 2  0 - 4 % Final   ??? Macrocytosis 01/08/2017 Moderate* Not Present Final   ??? Anisocytosis 01/08/2017 Moderate* Not Present Final   ??? Hypochromasia 01/08/2017 Slight* Not Present Final

## 2017-01-19 ENCOUNTER — Other Ambulatory Visit (HOSPITAL_COMMUNITY)
Admission: RE | Admit: 2017-01-19 | Discharge: 2017-01-19 | Disposition: A | Discharge status: Home / Self Care | Payer: 59 | Source: Ambulatory Visit | Attending: Gynecology | Admitting: Gynecology

## 2017-01-19 DIAGNOSIS — C569 Malignant neoplasm of unspecified ovary: Secondary | ICD-10-CM | POA: Diagnosis not present

## 2017-01-19 LAB — CBC WITH DIFFERENTIAL/PLATELET
Basophils Absolute: 0 10*3/uL (ref 0.0–0.1)
Basophils Relative: 1 %
Eosinophils Absolute: 0.1 10*3/uL (ref 0.0–0.7)
Eosinophils Relative: 3 %
HEMATOCRIT: 29.1 % — AB (ref 36.0–46.0)
HEMOGLOBIN: 9.3 g/dL — AB (ref 12.0–15.0)
LYMPHS ABS: 0.4 10*3/uL — AB (ref 0.7–4.0)
LYMPHS PCT: 12 %
MCH: 31.1 pg (ref 26.0–34.0)
MCHC: 32 g/dL (ref 30.0–36.0)
MCV: 97.3 fL (ref 78.0–100.0)
MONO ABS: 0.2 10*3/uL (ref 0.1–1.0)
MONOS PCT: 5 %
NEUTROS ABS: 2.8 10*3/uL (ref 1.7–7.7)
NEUTROS PCT: 79 %
Platelets: 319 10*3/uL (ref 150–400)
RBC: 2.99 MIL/uL — ABNORMAL LOW (ref 3.87–5.11)
RDW: 17 % — AB (ref 11.5–15.5)
WBC: 3.5 10*3/uL — ABNORMAL LOW (ref 4.0–10.5)

## 2017-01-22 NOTE — Unmapped (Signed)
Specialty Medication Follow-up    Sara Gaines is a 51 y.o. female with recurrent ovarian cancer who I am seeing for follow up on their treatment with oral etoposide.     Chemotherapy: Etoposide 50 mg PO daily   Start date: Cycle 2 Day 1 (01/13/17)    A/P:   1. Oral Chemotherapy: CBC w/diff reviewed. Grade 2 anemia which has improved. Grade 1 white blood count decreased which is stable. Patient continues to experience grade 1 fatigue and grade 1 nausea both of which are stable. She is 1 week into cycle 2 and continues to feel well. No grade 3 toxicity therefore will continue at current dose intensity. Will repeat labs prior to starting cycle 3 at clinic visit with Dr. Stanford Breed.  ?? Continue etoposide 50 mg PO daily days 1-14 of 21 day cycle  ?? Obtain CBC w/diff in 2 weeks    I spent approximately 10 minutes in direct patient care.    Next follow up: In 2 weeks at clinic appointment    Hermelinda Medicus, PharmD, BCOP, CPP  Gynecologic Oncology Clinic Pharmacist  Pager: [phone]    S/O: [name] was contacted regarding recent lab results. She reports continued fatigue. The fatigue is only with activity and has not worsened since starting oral etoposide therapy. The fatigue continues to be better with this than when she was on IV chemotherapy. She does experience some nausea but this has not resulted in an episode of emesis. She is altering her diet but is still able to maintain good oral intake.     Medications reviewed and updated in EPIC? no    Missed doses: None    Labs (01/19/17)  WBC 3.5 x10*9/L   Hgb 9.3 g/dL   Plt 454 U98*1/X   ANC 2.8 x10*9/L

## 2017-01-29 ENCOUNTER — Ambulatory Visit
Admission: RE | Admit: 2017-01-29 | Discharge: 2017-01-29 | Disposition: A | Discharge status: Home / Self Care | Payer: Commercial Managed Care - PPO

## 2017-01-29 ENCOUNTER — Ambulatory Visit
Admission: RE | Admit: 2017-01-29 | Discharge: 2017-01-29 | Disposition: A | Discharge status: Home / Self Care | Payer: Commercial Managed Care - PPO | Attending: Gynecologic Oncology | Admitting: Gynecologic Oncology

## 2017-01-29 DIAGNOSIS — C569 Malignant neoplasm of unspecified ovary: Principal | ICD-10-CM

## 2017-01-29 LAB — CBC W/ AUTO DIFF
BASOPHILS ABSOLUTE COUNT: 0 10*9/L (ref 0.0–0.1)
EOSINOPHILS ABSOLUTE COUNT: 0.1 10*9/L (ref 0.0–0.4)
HEMATOCRIT: 27.3 % — ABNORMAL LOW (ref 36.0–46.0)
HEMOGLOBIN: 8.9 g/dL — ABNORMAL LOW (ref 12.0–16.0)
LARGE UNSTAINED CELLS: 1 % (ref 0–4)
LYMPHOCYTES ABSOLUTE COUNT: 0.3 10*9/L — ABNORMAL LOW (ref 1.5–5.0)
MEAN CORPUSCULAR HEMOGLOBIN CONC: 32.5 g/dL (ref 31.0–37.0)
MEAN CORPUSCULAR HEMOGLOBIN: 31.5 pg (ref 26.0–34.0)
MEAN CORPUSCULAR VOLUME: 97 fL (ref 80.0–100.0)
MEAN PLATELET VOLUME: 9.4 fL (ref 7.0–10.0)
MONOCYTES ABSOLUTE COUNT: 0 10*9/L — ABNORMAL LOW (ref 0.2–0.8)
NEUTROPHILS ABSOLUTE COUNT: 4.4 10*9/L (ref 2.0–7.5)
RED BLOOD CELL COUNT: 2.81 10*12/L — ABNORMAL LOW (ref 4.00–5.20)
RED CELL DISTRIBUTION WIDTH: 19.1 % — ABNORMAL HIGH (ref 12.0–15.0)
WBC ADJUSTED: 4.8 10*9/L (ref 4.5–11.0)

## 2017-01-29 LAB — COMPREHENSIVE METABOLIC PANEL
ALBUMIN: 4.1 g/dL (ref 3.5–5.0)
ALKALINE PHOSPHATASE: 77 U/L (ref 38–126)
ALT (SGPT): 21 U/L (ref 15–48)
ANION GAP: 10 mmol/L (ref 9–15)
AST (SGOT): 15 U/L (ref 14–38)
BILIRUBIN TOTAL: 0.5 mg/dL (ref 0.0–1.2)
BLOOD UREA NITROGEN: 18 mg/dL (ref 7–21)
BUN / CREAT RATIO: 17
CHLORIDE: 101 mmol/L (ref 98–107)
CO2: 28 mmol/L (ref 22.0–30.0)
CREATININE: 1.07 mg/dL — ABNORMAL HIGH (ref 0.60–1.00)
EGFR MDRD AF AMER: >=60 mL/min/{1.73_m2} (ref >=60)
EGFR MDRD NON AF AMER: 54 mL/min/{1.73_m2} — ABNORMAL LOW (ref >=60)
GLUCOSE RANDOM: 111 mg/dL (ref 65–179)
POTASSIUM: 4.3 mmol/L (ref 3.5–5.0)
PROTEIN TOTAL: 6.5 g/dL (ref 6.5–8.3)
SODIUM: 139 mmol/L (ref 135–145)

## 2017-01-29 LAB — CA 125: Cancer Ag 125:ACnc:Pt:Ser/Plas:Qn:: 1250 — ABNORMAL HIGH

## 2017-01-29 LAB — LARGE UNSTAINED CELLS: Lab: 1

## 2017-01-29 LAB — BLOOD UREA NITROGEN: Urea nitrogen:MCnc:Pt:Ser/Plas:Qn:: 18

## 2017-01-29 NOTE — Unmapped (Signed)
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  Ca1 25 is obtained today.  Patient return to see me in 6 weeks (after 2 more cycles)  .  Interval History  The patient returns today for evaluation.  She is currently receiving etoposide 50 mg daily with a regimen of 2 weeks on 1 week off.  She has now finished 2 cycles.  She is tolerating therapy well with minimal nausea.  She does have some fatigue.  Review of laboratory work shows no significant nadir counts.  Overall she is doing quite well.     History of Present Illness: The patient initially presented with several months of increasing abdominal girth and a pelvic mass measuring 18 x 15 x 19 cm. She had multiple retroperitoneal lymph nodes and a 9 cm right-sided benign hepatic cyst. Preoperative CA-125 was 508 units per mL. September 21, 2015 the patient underwent exploratory laparotomy and radical debulking of a stage III C ovarian cancer. Surgery included total abdominal hysterectomy, bilateral salpingo-oophorectomy, extensive lysis of adhesions and ureteral lysis of the right ureter, right pelvic lymphadenectomy and right periodic lymphadenectomy and omentectomy. At the completion of the surgical procedure the only residual disease were small lymph nodes and the high periaortic region that were unresectable. The patient had an uncomplicated postoperative course.    First cycle of carboplatin and Taxol was administered on September 27, 2015. Initially she had a Taxol reaction but when rechallenged did well. She completed 6 cycles of carboplatin and Taxol in December 2017. At that time her CA-125 value was 76 units per mL. But quickly rose to 94 units per mL.    Follow-up CT scan showed persistent disease in the periaortic chain.    The patient received periaortic and left pelvic sidewall radiation therapy completed May 12, 2016. She tolerated therapy well.    Unfortunately, shortly after completing radiation therapy her CA-125 rose to 444 units per mL. (Previously 94 units per mL)  We reinitiated second line chemotherapy in July 2018 using gemcitabine, cisplatin, and a Avastin. She had an initial response this regimen but then had progressive disease documented a rising CA-125 values November 13, 2016.     A  CT scan in November showing new areas of carcinomatosis. .    Impression       -- New omental nodularity in the left upper quadrant and nodularity along the peritoneal reflection, suspicious for peritoneal and omental metastasis. Short interval follow-up is suggested.    --Unchanged subcentimeter pulmonary nodules.    --Interval resolution of previously noted retroperitoneal and common iliac lymph nodes. There is an unchanged 1.8 cm lymph node or soft tissue focus along the right external iliac artery.    --Unchanged 3 cm right pelvic cystic lesion, likely a postsurgical seroma or lymphocele     We then initiated therapy using oral etoposide with a regimen of 50 mg daily for 2 weeks on and one-week off.    Review of Systems:  10 point review of systems is negative except as noted in Interval History.    Physical Exam:  BP 130/76  - Pulse 121  - Temp 36.6 ??C (97.9 ??F) (Oral)  - Wt 87.4 kg (192 lb 11.2 oz)  - SpO2 100%  - BMI 34.14 kg/m??     Heent: Normocephalic, EOM intact, Hearing normal  Neck:  Supple without thryromegally    Lymph Nodes:  Supraclavicular and inguinal nodes normal    Abdomen:  Soft, nontender, No masses, organomegally, ascites or hernias, midline incisions healing well.    Pelvic:  EGBUS: Normal  Vagina:  Normal, no lesions, cuff well healed  Cervix:  Surgically absent  Uterus:  Surgically absent  Adenxa:  No masses, nodularity or tenderness  Rectal: normal sphincter tone, no masses    Extermities:  Normal, no varicosities or edema      Past Medical History:   Diagnosis Date   ??? Anemia    ??? GERD (gastroesophageal reflux disease)    ??? Hidradenitis suppurativa    ??? Hypertension    ??? Hypothyroid    ??? Narcolepsy without cataplexy(347.00)    ??? Obesity (BMI 30-39.9)    ??? Sciatica    ??? Sleep apnea     mild OSA; no CPAP was needed.    ??? Tachycardia      Past Surgical History:   Procedure Laterality Date   ??? IR INSERT PORT AGE GREATER THAN 5 YRS  09/21/2015    IR INSERT PORT AGE GREATER THAN 5 YRS 09/21/2015 Jobe Gibbon, MD IMG VIR MM MMNT   ??? PR RELEASE URETER,RETROPER FIBROSIS Right 09/11/2015    Procedure: Ureterolysis, With Or Without Repositioning Of Ureter For Retroperitoneal Fibrosis;  Surgeon: Roxan Diesel, MD;  Location: MAIN OR [facility];  Service: Gynecology Oncology   ??? PR RESEC OVAR MALIG+TAH+PELV NODES Bilateral 09/11/2015    Procedure: Resection(Initial) Ovarian/Tubal/Prima Pertonl Malig W/Bil S&O, Omntect; W/Tah/Ltd Para-Aortic Lymphadenect;  Surgeon: Roxan Diesel, MD;  Location: MAIN OR [facility];  Service: Gynecology Oncology   ??? WISDOM TOOTH EXTRACTION       Allergies   Allergen Reactions   ??? Ampicillin Hives     Remote Hx of ampicillins     Ms. Foell had no medications administered during this visit.  Family History   Problem Relation Age of Onset   ??? Coronary artery disease Mother    ??? Diabetes Mother    ??? Diabetes Father    ??? Lung cancer Father    ??? Clotting disorder Neg Hx    ??? Anesthesia problems Neg Hx      Social History     Social History   ??? Marital status: Married     Spouse name: N/A   ??? Number of children: N/A   ??? Years of education: N/A     Occupational History   ??? Not on file.     Social History Main Topics   ??? Smoking status: Never Smoker   ??? Smokeless tobacco: Never Used   ??? Alcohol use No   ??? Drug use: No   ??? Sexual activity: Not on file     Other Topics Concern   ??? Not on file     Social History Narrative   ??? No narrative on file

## 2017-01-29 NOTE — Unmapped (Signed)
We will contact you with a Ca1 25 report.  Return to see me in 6 weeks.  Continue taking etoposide 50 mg daily for 2 weeks and then 1 week off.

## 2017-01-29 NOTE — Unmapped (Signed)
Right chest port accessed, labs drawn and sent for cbc with diff, cmp and magnesium. Patient tolerated procedure well. B.Zane Samson RN

## 2017-01-31 NOTE — Unmapped (Signed)
Specialty Medication Follow-up    Sara Gaines is a 51 y.o. female with recurrent ovarian cancer who I am seeing for follow up on their treatment with oral etoposide.     Chemotherapy: Etoposide 50 mg PO daily   Start date: Cycle 3 Day 1 (02/03/17)    A/P:   1. Oral Chemotherapy: CBC w/diff and CMP reviewed. Grade 1 anemia which has worsened and grade 1 serum creatinine elevation which has improved. No grade 3 toxicity present and patient meets parameters to start cycle 3. Given the rise in CA-125 Sara Gaines would like to switch to 3 weeks on and 1 week off regimen for etoposide. Will obtain labs in 2 weeks followed by weekly labs while on therapy.   ?? Change etoposide 50 mg PO daily days 1-21 of 28 day cycle  ?? Obtain CBC w/diff in 2 weeks    I spent approximately 15 minutes in direct patient care.    Next follow up: In 2 weeks with lab results    Hermelinda Medicus, PharmD, BCOP, CPP  Gynecologic Oncology Clinic Pharmacist  Pager: [phone]    S/O: Sara Gaines presents to clinic for follow up with Sara Gaines after 2 cycles of oral etoposide. She reports mild nausea which is intermittent and short-lived. She denies any episodes of vomiting but states that it does impact her diet. She is able to maintain good oral intake despite nausea. She does experience some fatigue but this has not worsened. Her SOB with activity has improved.     Medications reviewed and updated in EPIC? no    Missed doses: None    Labs  Infusion on 01/29/2017   Component Date Value Ref Range Status   ??? Sodium 01/29/2017 139  135 - 145 mmol/L Final   ??? Potassium 01/29/2017 4.3  3.5 - 5.0 mmol/L Final   ??? Chloride 01/29/2017 101  98 - 107 mmol/L Final   ??? CO2 01/29/2017 28.0  22.0 - 30.0 mmol/L Final   ??? BUN 01/29/2017 18  7 - 21 mg/dL Final   ??? Creatinine 01/29/2017 1.07* 0.60 - 1.00 mg/dL Final   ??? BUN/Creatinine Ratio 01/29/2017 17   Final   ??? EGFR MDRD Non Af Amer 01/29/2017 54* >=60 mL/min/1.36m2 Final   ??? EGFR MDRD Af Amer 01/29/2017 >=60  >=60 mL/min/1.48m2 Final   ??? Anion Gap 01/29/2017 10  9 - 15 mmol/L Final   ??? Glucose 01/29/2017 111  65 - 179 mg/dL Final   ??? Calcium 45/40/9811 9.2  8.5 - 10.2 mg/dL Final   ??? Albumin 91/47/8295 4.1  3.5 - 5.0 g/dL Final   ??? Total Protein 01/29/2017 6.5  6.5 - 8.3 g/dL Final   ??? Total Bilirubin 01/29/2017 0.5  0.0 - 1.2 mg/dL Final   ??? AST 62/13/0865 15  14 - 38 U/L Final   ??? ALT 01/29/2017 21  15 - 48 U/L Final   ??? Alkaline Phosphatase 01/29/2017 77  38 - 126 U/L Final   ??? CA 125 01/29/2017 1250.0* 0.0 - 34.9 U/mL Final   ??? WBC 01/29/2017 4.8  4.5 - 11.0 10*9/L Final   ??? RBC 01/29/2017 2.81* 4.00 - 5.20 10*12/L Final   ??? HGB 01/29/2017 8.9* 12.0 - 16.0 g/dL Final   ??? HCT 78/46/9629 27.3* 36.0 - 46.0 % Final   ??? MCV 01/29/2017 97.0  80.0 - 100.0 fL Final   ??? MCH 01/29/2017 31.5  26.0 - 34.0 pg Final   ??? MCHC 01/29/2017 32.5  31.0 - 37.0 g/dL Final   ??? RDW 57/84/6962 19.1* 12.0 - 15.0 % Final   ??? MPV 01/29/2017 9.4  7.0 - 10.0 fL Final   ??? Platelet 01/29/2017 207  150 - 440 10*9/L Final   ??? Neutrophil Left Shift 01/29/2017 1+* Not Present Final   ??? Absolute Neutrophils 01/29/2017 4.4  2.0 - 7.5 10*9/L Final   ??? Absolute Lymphocytes 01/29/2017 0.3* 1.5 - 5.0 10*9/L Final   ??? Absolute Monocytes 01/29/2017 0.0* 0.2 - 0.8 10*9/L Final   ??? Absolute Eosinophils 01/29/2017 0.1  0.0 - 0.4 10*9/L Final   ??? Absolute Basophils 01/29/2017 0.0  0.0 - 0.1 10*9/L Final   ??? Large Unstained Cells 01/29/2017 1  0 - 4 % Final   ??? Macrocytosis 01/29/2017 Moderate* Not Present Final   ??? Anisocytosis 01/29/2017 Moderate* Not Present Final   ??? Hypochromasia 01/29/2017 Slight* Not Present Final

## 2017-02-09 ENCOUNTER — Encounter: Payer: Self-pay | Admitting: *Deleted

## 2017-02-09 NOTE — Progress Notes (Signed)
On 02-09-17 fax medical records [facility], it was consult note , end of tx note, sim & planning note, follow up note, and dos will do there part

## 2017-02-13 NOTE — Unmapped (Signed)
[facility] Refill Coordination Note  Specialty Medication(s): Etoposide 50mg       Sara Gaines, DOB: [birth year]  Phone: [phone] (home) , Alternate phone contact: N/A  Phone or address changes today?: No  All above HIPAA information was verified with patient.  Shipping Address: [address]  [place] [address]   Insurance changes? No    Completed refill call assessment today to schedule patient's medication shipment from the [facility] [phone]).      Confirmed the medication and dosage are correct and have not changed: Yes, regimen is correct and unchanged.    Confirmed patient started or stopped the following medications in the past month:  No, there are no changes reported at this time.    Are you tolerating your medication?:  Loreley reports tolerating the medication.    ADHERENCE    Is this medicine transplant or covered by Medicare Part B? No.        Did you miss any doses in the past 4 weeks? No missed doses reported.    FINANCIAL/SHIPPING    Delivery Scheduled: Yes, Expected medication delivery date: 02/17/17     [name] did not have any additional questions at this time.    Delivery address validated in [facility] scheduling system: Yes, address listed in [facility] is correct.    We will follow up with patient monthly for standard refill processing and delivery.      Thank you,  [employer]   [facility] Specialty Pharmacist

## 2017-02-19 MED FILL — ETOPOSIDE/50MG/CAP: ETOPOSIDE/50MG/CAP | 28 days supply | Qty: 21 | Fill #2

## 2017-02-23 ENCOUNTER — Other Ambulatory Visit (HOSPITAL_COMMUNITY)
Admission: RE | Admit: 2017-02-23 | Discharge: 2017-02-23 | Disposition: A | Discharge status: Home / Self Care | Payer: 59 | Source: Ambulatory Visit | Attending: Gynecology | Admitting: Gynecology

## 2017-02-23 DIAGNOSIS — C569 Malignant neoplasm of unspecified ovary: Secondary | ICD-10-CM | POA: Insufficient documentation

## 2017-02-23 LAB — CBC WITH DIFFERENTIAL/PLATELET
BASOS PCT: 1 %
Basophils Absolute: 0 10*3/uL (ref 0.0–0.1)
EOS ABS: 0.1 10*3/uL (ref 0.0–0.7)
EOS PCT: 2 %
HCT: 28.2 % — ABNORMAL LOW (ref 36.0–46.0)
HEMOGLOBIN: 8.9 g/dL — AB (ref 12.0–15.0)
Lymphocytes Relative: 12 %
Lymphs Abs: 0.5 10*3/uL — ABNORMAL LOW (ref 0.7–4.0)
MCH: 30.3 pg (ref 26.0–34.0)
MCHC: 31.6 g/dL (ref 30.0–36.0)
MCV: 95.9 fL (ref 78.0–100.0)
Monocytes Absolute: 0.1 10*3/uL (ref 0.1–1.0)
Monocytes Relative: 3 %
NEUTROS PCT: 82 %
Neutro Abs: 3.2 10*3/uL (ref 1.7–7.7)
PLATELETS: 271 10*3/uL (ref 150–400)
RBC: 2.94 MIL/uL — AB (ref 3.87–5.11)
RDW: 19.6 % — ABNORMAL HIGH (ref 11.5–15.5)
WBC: 3.9 10*3/uL — AB (ref 4.0–10.5)

## 2017-03-02 NOTE — Unmapped (Signed)
Specialty Medication Follow-up    Sara Gaines is a 52 y.o. female with recurrent ovarian cancer who I am seeing for follow up on their treatment with oral etoposide.     Chemotherapy: Etoposide 50 mg PO daily   Start date: Cycle 4 Day 1 (03/03/17)    A/P:   1. Oral Chemotherapy: CBC w/diff reviewed. Grade 1 white blood count decreased which is worse. Grade 2 anemia and grade 1 fatigue both of which are stable. While hemoglobin is slightly below target for treatment parameters it is at baseline for her and therefore will plan to start her next cycle of chemotherapy on Tuesday. Will continue weekly labs starting week 2 of oral chemotherapy.  ?? Change etoposide 50 mg PO daily days 1-21 of 28 day cycle  ?? Obtain CBC w/diff in 2 weeks    I spent approximately 10 minutes in direct patient care.    Next follow up: In 2 weeks with lab results    Hermelinda Medicus, PharmD, BCOP, CPP  Gynecologic Oncology Clinic Pharmacist  Pager: [phone]    S/O: [name] was contacted regarding recent lab results. She reports overall feeling well with continued fatigue. The fatigue is only with activity and reaches its worse on the last week of oral etoposide prior to her week off therapy. She otherwise denies any other adverse effects.     Medications reviewed and updated in EPIC? no    Missed doses: None    Labs (02/23/17)  WBC 3.9 x10*9/L   Hgb 8.9 g/dL   Plt 784 O96*2/X   ANC 3.2 x10*9/L

## 2017-03-03 MED FILL — LEVOTHYROXINE 50 MCG TABLET: 50 | 90 days supply | Qty: 90 | Fill #1

## 2017-03-04 MED ORDER — ETOPOSIDE 50 MG CAPSULE
ORAL_CAPSULE | Freq: Every day | ORAL | 5 refills | 0 days | Status: CP
Start: 2017-03-04 — End: 2018-03-04

## 2017-03-04 NOTE — Unmapped (Signed)
Telephone Call: Cold Symptoms    A/P  1. Cold symptoms: Given absence of a fever or cough with adequate neutrophils on most recent lab work it is reasonable to continue oral etoposide at this point in time. She will contact our office or the on-call number if symptoms worsen. If she develops a fever she knows to report to local ED for further evaluation.    S/O: Sara Gaines is a 52 y.o. female with ovarian cancer currently receiving oral etoposide. [name] contacted me regarding her current symptoms and oral etoposide administration. She reports symptoms of a cold including congestion and runny nose. She denies a cough, changes in mucus color, or fever.  Most recent ANC was 3.2 which indicates that the patient is not neutropenic at this time.     I spent approximately 5 minutes in patient care activities.    Hermelinda Medicus, PharmD, BCOP, CPP  Gynecologic Oncology Clinic Pharmacist  Pager: [phone]

## 2017-03-10 ENCOUNTER — Other Ambulatory Visit (HOSPITAL_COMMUNITY)
Admission: RE | Admit: 2017-03-10 | Discharge: 2017-03-10 | Disposition: A | Discharge status: Home / Self Care | Payer: BLUE CROSS/BLUE SHIELD | Source: Ambulatory Visit | Attending: Gynecology | Admitting: Gynecology

## 2017-03-10 DIAGNOSIS — C569 Malignant neoplasm of unspecified ovary: Secondary | ICD-10-CM | POA: Diagnosis not present

## 2017-03-10 LAB — CBC WITH DIFFERENTIAL/PLATELET
Basophils Absolute: 0 10*3/uL (ref 0.0–0.1)
Basophils Relative: 0 %
EOS ABS: 0.1 10*3/uL (ref 0.0–0.7)
EOS PCT: 1 %
HCT: 27.1 % — ABNORMAL LOW (ref 36.0–46.0)
Hemoglobin: 8.6 g/dL — ABNORMAL LOW (ref 12.0–15.0)
LYMPHS ABS: 0.5 10*3/uL — AB (ref 0.7–4.0)
Lymphocytes Relative: 10 %
MCH: 30.3 pg (ref 26.0–34.0)
MCHC: 31.7 g/dL (ref 30.0–36.0)
MCV: 95.4 fL (ref 78.0–100.0)
Monocytes Absolute: 0.7 10*3/uL (ref 0.1–1.0)
Monocytes Relative: 15 %
Neutro Abs: 3.4 10*3/uL (ref 1.7–7.7)
Neutrophils Relative %: 74 %
PLATELETS: 401 10*3/uL — AB (ref 150–400)
RBC: 2.84 MIL/uL — ABNORMAL LOW (ref 3.87–5.11)
RDW: 20.7 % — ABNORMAL HIGH (ref 11.5–15.5)
WBC: 4.6 10*3/uL (ref 4.0–10.5)

## 2017-03-10 MED FILL — PROCHLORPERAZINE 10 MG TAB: 10 | 10 days supply | Qty: 30 | Fill #0

## 2017-03-13 NOTE — Unmapped (Signed)
[facility] Pharm[facility] Refill Coordination Note  Specialty Medication(s): Etoposide 50mg       Sara Gaines, DOB: October 02, 1965  Phone: [phone] (home) , Alternate phone contact: N/A  Phone or address changes today?: No  All above HIPAA information was verified with patient.  Shipping Address: [address]  [place] [address]   Insurance changes? No    Completed refill call assessment today to schedule patient's medication shipment from the [facility] [phone]).      Confirmed the medication and dosage are correct and have not changed: Yes, regimen is correct and unchanged.    Confirmed patient started or stopped the following medications in the past month:  No, there are no changes reported at this time.    Are you tolerating your medication?:  Sara Gaines reports tolerating the medication.    ADHERENCE    Is this medicine transplant or covered by Medicare Part B? No.        Did you miss any doses in the past 4 weeks? No missed doses reported.    FINANCIAL/SHIPPING    Delivery Scheduled: Yes, Expected medication delivery date: 03/26/17     Sara Gaines did not have any additional questions at this time.    Delivery address validated in [facility] scheduling system: Yes, address listed in [facility] is correct.    We will follow up with patient monthly for standard refill processing and delivery.      Thank you,  Sara Gaines   [facility] Specialty Pharmacist

## 2017-03-15 NOTE — Unmapped (Signed)
Specialty Medication Follow-up    Sara Gaines is a 52 y.o. female with recurrent ovarian cancer who I am seeing for follow up on their treatment with oral etoposide.     Chemotherapy: Etoposide 50 mg PO daily   Start date: Cycle 4 Day 1 (03/03/17)    A/P:   1. Oral Chemotherapy: CBC w/diff reviewed. Grade 2 anemia which has worsened but still meets parameters so no need to adjust the dosing. Patient will continue current etoposide and she is currently on week 1 of cycle 4. Will continue with weekly labs while receiving therapy.   ?? Continue etoposide 50 mg PO daily days 1-21 of 28 day cycle  ?? Obtain CBC w/diff and CMP in 1 week    I spent approximately 10 minutes in direct patient care.    Next follow up: In 1 week at clinic visit    Hermelinda Medicus, PharmD, BCOP, CPP  Gynecologic Oncology Clinic Pharmacist  Pager: [phone]    S/O: [name] was contacted regarding recent lab results. She reports that she continues to have fatigue but this has not worsened. She continues to have a cold but denies that this has worsened and denies being febrile. She notices more adverse effects from this regimen of every 21 days of a 28 day cycle compared to the 14 days of 21 day cycle.     Medications reviewed and updated in EPIC? no    Missed doses: None    Labs (03/10/17)  WBC 4.6 x10*9/L   Hgb 8.6 g/dL   Plt 454 U98*1/X   ANC 3.4 x10*9/L

## 2017-03-19 ENCOUNTER — Encounter
Admit: 2017-03-19 | Discharge: 2017-03-19 | Payer: PRIVATE HEALTH INSURANCE | Attending: Gynecologic Oncology | Primary: Gynecologic Oncology

## 2017-03-19 ENCOUNTER — Encounter: Admit: 2017-03-19 | Discharge: 2017-03-19 | Payer: PRIVATE HEALTH INSURANCE

## 2017-03-19 DIAGNOSIS — C569 Malignant neoplasm of unspecified ovary: Principal | ICD-10-CM

## 2017-03-19 LAB — COMPREHENSIVE METABOLIC PANEL
ALBUMIN: 4.1 g/dL (ref 3.5–5.0)
ALKALINE PHOSPHATASE: 153 U/L — ABNORMAL HIGH (ref 38–126)
ALT (SGPT): 70 U/L — ABNORMAL HIGH (ref 15–48)
ANION GAP: 7 mmol/L — ABNORMAL LOW (ref 9–15)
AST (SGOT): 45 U/L — ABNORMAL HIGH (ref 14–38)
BILIRUBIN TOTAL: 0.7 mg/dL (ref 0.0–1.2)
BLOOD UREA NITROGEN: 16 mg/dL (ref 7–21)
BUN / CREAT RATIO: 13
CALCIUM: 9 mg/dL (ref 8.5–10.2)
CHLORIDE: 98 mmol/L (ref 98–107)
CO2: 32 mmol/L — ABNORMAL HIGH (ref 22.0–30.0)
CREATININE: 1.27 mg/dL — ABNORMAL HIGH (ref 0.60–1.00)
EGFR MDRD AF AMER: 54 mL/min/{1.73_m2} — ABNORMAL LOW (ref >=60)
EGFR MDRD NON AF AMER: 44 mL/min/{1.73_m2} — ABNORMAL LOW (ref >=60)
GLUCOSE RANDOM: 113 mg/dL (ref 65–179)
POTASSIUM: 3.9 mmol/L (ref 3.5–5.0)
PROTEIN TOTAL: 6.4 g/dL — ABNORMAL LOW (ref 6.5–8.3)

## 2017-03-19 LAB — CBC W/ AUTO DIFF
BASOPHILS ABSOLUTE COUNT: 0 10*9/L (ref 0.0–0.1)
BASOPHILS RELATIVE PERCENT: 0.4 %
EOSINOPHILS ABSOLUTE COUNT: 0.1 10*9/L (ref 0.0–0.4)
EOSINOPHILS RELATIVE PERCENT: 1.5 %
HEMATOCRIT: 26.7 % — ABNORMAL LOW (ref 36.0–46.0)
HEMOGLOBIN: 8.4 g/dL — ABNORMAL LOW (ref 12.0–16.0)
LARGE UNSTAINED CELLS: 1 % (ref 0–4)
LYMPHOCYTES ABSOLUTE COUNT: 0.3 10*9/L — ABNORMAL LOW (ref 1.5–5.0)
LYMPHOCYTES RELATIVE PERCENT: 5.6 %
MEAN CORPUSCULAR HEMOGLOBIN CONC: 31.4 g/dL (ref 31.0–37.0)
MEAN CORPUSCULAR VOLUME: 96.2 fL (ref 80.0–100.0)
MONOCYTES ABSOLUTE COUNT: 0.1 10*9/L — ABNORMAL LOW (ref 0.2–0.8)
MONOCYTES RELATIVE PERCENT: 1.8 %
NEUTROPHILS ABSOLUTE COUNT: 4.7 10*9/L (ref 2.0–7.5)
NEUTROPHILS RELATIVE PERCENT: 89.4 %
PLATELET COUNT: 332 10*9/L (ref 150–440)
RED CELL DISTRIBUTION WIDTH: 22.3 % — ABNORMAL HIGH (ref 12.0–15.0)
WBC ADJUSTED: 5.3 10*9/L (ref 4.5–11.0)

## 2017-03-19 LAB — ANION GAP: Anion gap 3:SCnc:Pt:Ser/Plas:Qn:: 7 — ABNORMAL LOW

## 2017-03-19 LAB — LYMPHOCYTES ABSOLUTE COUNT: Lab: 0.3 — ABNORMAL LOW

## 2017-03-19 LAB — CA 125
CA 125: 2280 U/mL — ABNORMAL HIGH (ref 0.0–34.9)
Cancer Ag 125:ACnc:Pt:Ser/Plas:Qn:: 2280 — ABNORMAL HIGH

## 2017-03-19 LAB — SMEAR REVIEW

## 2017-03-19 NOTE — Unmapped (Signed)
We will contact you with a Ca1 25 report.  Return to see me in approximately 6 weeks.

## 2017-03-19 NOTE — Unmapped (Signed)
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  Currently taking etoposide.    Plan: We will continue oral etoposide 50 mg daily for 3 weeks and then 1 week off.  Ca1 25 is obtained today.  Patient return to see me in 6 weeks (after completing the current cycle and receiving 1 more cycle)  .  Interval History  The patient returns today for evaluation.  She is currently receiving etoposide 50 mg daily with a regimen of 3 weeks on 1 week off.  She is currently approximately halfway through cycle 4.  Since her last visit she sought second opinion at [facility] in [place].  She was reassured that her treatment to date and ongoing treatment was at the standard of care and no further recommendations were made except for some dietary changes.  Currently the patient's midway through cycle 4.  Initially we started with a regimen of 2 weeks on 1 week off but then switched to 3 weeks on and one-week off.  She reports that the Ca1 25 obtained at [facility] was 247 points lower than the last value she had here.  She also had a CAT scan which she says was interpreted as having stable disease.  We do not have that report available to [place].    .  She is tolerating therapy well with minimal nausea.  She does have some fatigue.  Review of laboratory work shows no significant nadir counts.  Overall she is doing quite well.     History of Present Illness: The patient initially presented with several months of increasing abdominal girth and a pelvic mass measuring 18 x 15 x 19 cm. She had multiple retroperitoneal lymph nodes and a 9 cm right-sided benign hepatic cyst. Preoperative CA-125 was 508 units per mL. September 21, 2015 the patient underwent exploratory laparotomy and radical debulking of a stage III C ovarian cancer. Surgery included total abdominal hysterectomy, bilateral salpingo-oophorectomy, extensive lysis of adhesions and ureteral lysis of the right ureter, right pelvic lymphadenectomy and right periodic lymphadenectomy and omentectomy. At the completion of the surgical procedure the only residual disease were small lymph nodes and the high periaortic region that were unresectable. The patient had an uncomplicated postoperative course.    First cycle of carboplatin and Taxol was administered on September 27, 2015. Initially she had a Taxol reaction but when rechallenged did well. She completed 6 cycles of carboplatin and Taxol in December 2017. At that time her CA-125 value was 76 units per mL. But quickly rose to 94 units per mL.    Follow-up CT scan showed persistent disease in the periaortic chain.    The patient received periaortic and left pelvic sidewall radiation therapy completed May 12, 2016. She tolerated therapy well.    Unfortunately, shortly after completing radiation therapy her CA-125 rose to 444 units per mL. (Previously 94 units per mL)  We reinitiated second line chemotherapy in July 2018 using gemcitabine, cisplatin, and a Avastin. She had an initial response this regimen but then had progressive disease documented a rising CA-125 values November 13, 2016.     A  CT scan in November showing new areas of carcinomatosis. .    Impression       -- New omental nodularity in the left upper quadrant and nodularity along the peritoneal reflection, suspicious for peritoneal and omental metastasis. Short interval follow-up is suggested.    --Unchanged subcentimeter pulmonary nodules.    --  Interval resolution of previously noted retroperitoneal and common iliac lymph nodes. There is an unchanged 1.8 cm lymph node or soft tissue focus along the right external iliac artery.    --Unchanged 3 cm right pelvic cystic lesion, likely a postsurgical seroma or lymphocele     We then initiated therapy using oral etoposide with a regimen of 50 mg daily for 2 weeks on and one-week off.    Review of Systems:  10 point review of systems is negative except as noted in Interval History.    Physical Exam:  BP 145/90  - Pulse 133  - Temp 36.7 ??C (98.1 ??F) (Oral)  - Wt 84.4 kg (186 lb)  - SpO2 98%  - BMI 32.96 kg/m??     Heent: Normocephalic, EOM intact, Hearing normal    Neck:  Supple without thryromegally    Lymph Nodes:  Supraclavicular and inguinal nodes normal    Abdomen:  Soft, nontender, No masses, organomegally, ascites or hernias, midline incisions healing well.    Pelvic:  EGBUS: Normal  Vagina:  Normal, no lesions, cuff well healed  Cervix:  Surgically absent  Uterus:  Surgically absent  Adenxa:  No masses, nodularity or tenderness  Rectal: normal sphincter tone, no masses    Extermities:  Normal, no varicosities or edema      Past Medical History:   Diagnosis Date   ??? Anemia    ??? GERD (gastroesophageal reflux disease)    ??? Hidradenitis suppurativa    ??? Hypertension    ??? Hypothyroid    ??? Narcolepsy without cataplexy(347.00)    ??? Obesity (BMI 30-39.9)    ??? Sciatica    ??? Sleep apnea     mild OSA; no CPAP was needed.    ??? Tachycardia      Past Surgical History:   Procedure Laterality Date   ??? IR INSERT PORT AGE GREATER THAN 5 YRS  09/21/2015    IR INSERT PORT AGE GREATER THAN 5 YRS 09/21/2015 Jobe Gibbon, MD IMG VIR MM MMNT   ??? PR RELEASE URETER,RETROPER FIBROSIS Right 09/11/2015    Procedure: Ureterolysis, With Or Without Repositioning Of Ureter For Retroperitoneal Fibrosis;  Surgeon: Roxan Diesel, MD;  Location: MAIN OR [facility];  Service: Gynecology Oncology   ??? PR RESEC OVAR MALIG+TAH+PELV NODES Bilateral 09/11/2015    Procedure: Resection(Initial) Ovarian/Tubal/Prima Pertonl Malig W/Bil S&O, Omntect; W/Tah/Ltd Para-Aortic Lymphadenect;  Surgeon: Roxan Diesel, MD;  Location: MAIN OR [facility];  Service: Gynecology Oncology   ??? WISDOM TOOTH EXTRACTION       Allergies   Allergen Reactions   ??? Ampicillin Hives     Remote Hx of ampicillins     Ms. Boddy had no medications administered during this visit.  Family History   Problem Relation Age of Onset   ??? Coronary artery disease Mother    ??? Diabetes Mother    ??? Diabetes Father    ??? Lung cancer Father    ??? Clotting disorder Neg Hx    ??? Anesthesia problems Neg Hx      Social History     Social History   ??? Marital status: Married     Spouse name: N/A   ??? Number of children: N/A   ??? Years of education: N/A     Occupational History   ??? Not on file.     Social History Main Topics   ??? Smoking status: Never Smoker   ??? Smokeless tobacco: Never Used   ??? Alcohol use  No   ??? Drug use: No   ??? Sexual activity: Not on file     Other Topics Concern   ??? Not on file     Social History Narrative   ??? No narrative on file

## 2017-03-19 NOTE — Unmapped (Signed)
Right chest port accessed, labs drawn and sent for cbc with diff, cmp and ca-125. Patient tolerated procedure well. B.Therasa Lorenzi RN

## 2017-03-20 ENCOUNTER — Encounter: Payer: Self-pay | Admitting: Family Medicine

## 2017-03-23 NOTE — Unmapped (Signed)
Addended by: Crecencio Mc on: 03/22/2017 06:04 PM     Modules accepted: Orders

## 2017-03-23 NOTE — Unmapped (Signed)
Specialty Medication Follow-up    Sara Gaines is a 52 y.o. female with recurrent ovarian cancer who I am seeing for follow up on their treatment with oral etoposide.     Chemotherapy: Etoposide 50 mg PO daily   Start date: Cycle 4 Day 1 (03/03/17)    A/P:   1. Oral Chemotherapy: CBC w/diff and CMP reviewed. Grade 2 anemia, grade 1 serum creatinine elevation, grade 1 AST elevation, grade 1 ALT elevation, and grade 1 alk phos elevation all of which have worsened. Grade 1 fatigue which is stable. Given the recent LFT elevation and recent start of tumeric will recommend that the patient discontinue tumeric and repeat labs in 1 week to reassess. No grade 3 toxicity therefore will continue with current regimen of etoposide in a 28 day cycle.   ?? Continue etoposide 50 mg PO daily days 1-21 of 28 day cycle  ?? Discontinue tumeric due to LFT elevelation  ?? Obtain CBC w/diff and CMP in 1 week    I spent approximately 10 minutes in direct patient care.    Next follow up: In 1 week with lab results    Hermelinda Medicus, PharmD, BCOP, CPP  Gynecologic Oncology Clinic Pharmacist  Pager: [phone]    S/O: Sara Gaines presents to clinic for a follow up appointment with Sara Gaines. She reports fatigue which is only present with activity otherwise she continues to feel well. She does endorse recently starting tumeric therapy for the anti-inflammatory properties. She does endorse missing one dose of etoposide due to falling asleep prior to taking the medication. She now sets an alarm on her phone to avoid missing doses in the future.    Medications reviewed and updated in EPIC? yes    Missed doses: 1 dose    Labs  Infusion on 03/19/2017   Component Date Value Ref Range Status   ??? Sodium 03/19/2017 137  135 - 145 mmol/L Final   ??? Potassium 03/19/2017 3.9  3.5 - 5.0 mmol/L Final   ??? Chloride 03/19/2017 98  98 - 107 mmol/L Final   ??? CO2 03/19/2017 32.0* 22.0 - 30.0 mmol/L Final   ??? BUN 03/19/2017 16  7 - 21 mg/dL Final   ??? Creatinine 03/19/2017 1.27* 0.60 - 1.00 mg/dL Final   ??? BUN/Creatinine Ratio 03/19/2017 13   Final   ??? EGFR MDRD Non Af Amer 03/19/2017 44* >=60 mL/min/1.20m2 Final   ??? EGFR MDRD Af Amer 03/19/2017 54* >=60 mL/min/1.28m2 Final   ??? Anion Gap 03/19/2017 7* 9 - 15 mmol/L Final   ??? Glucose 03/19/2017 113  65 - 179 mg/dL Final   ??? Calcium 45/40/9811 9.0  8.5 - 10.2 mg/dL Final   ??? Albumin 91/47/8295 4.1  3.5 - 5.0 g/dL Final   ??? Total Protein 03/19/2017 6.4* 6.5 - 8.3 g/dL Final   ??? Total Bilirubin 03/19/2017 0.7  0.0 - 1.2 mg/dL Final   ??? AST 62/13/0865 45* 14 - 38 U/L Final   ??? ALT 03/19/2017 70* 15 - 48 U/L Final   ??? Alkaline Phosphatase 03/19/2017 153* 38 - 126 U/L Final   ??? CA 125 03/19/2017 2280.0* 0.0 - 34.9 U/mL Final   ??? WBC 03/19/2017 5.3  4.5 - 11.0 10*9/L Final   ??? RBC 03/19/2017 2.77* 4.00 - 5.20 10*12/L Final   ??? HGB 03/19/2017 8.4* 12.0 - 16.0 g/dL Final   ??? HCT 78/46/9629 26.7* 36.0 - 46.0 % Final   ??? MCV 03/19/2017 96.2  80.0 - 100.0 fL Final   ???  MCH 03/19/2017 30.2  26.0 - 34.0 pg Final   ??? MCHC 03/19/2017 31.4  31.0 - 37.0 g/dL Final   ??? RDW 16/11/9602 22.3* 12.0 - 15.0 % Final   ??? MPV 03/19/2017 7.9  7.0 - 10.0 fL Final   ??? Platelet 03/19/2017 332  150 - 440 10*9/L Final   ??? Neutrophils % 03/19/2017 89.4  % Final   ??? Lymphocytes % 03/19/2017 5.6  % Final   ??? Monocytes % 03/19/2017 1.8  % Final   ??? Eosinophils % 03/19/2017 1.5  % Final   ??? Basophils % 03/19/2017 0.4  % Final   ??? Neutrophil Left Shift 03/19/2017 1+* Not Present Final   ??? Absolute Neutrophils 03/19/2017 4.7  2.0 - 7.5 10*9/L Final   ??? Absolute Lymphocytes 03/19/2017 0.3* 1.5 - 5.0 10*9/L Final   ??? Absolute Monocytes 03/19/2017 0.1* 0.2 - 0.8 10*9/L Final   ??? Absolute Eosinophils 03/19/2017 0.1  0.0 - 0.4 10*9/L Final   ??? Absolute Basophils 03/19/2017 0.0  0.0 - 0.1 10*9/L Final   ??? Large Unstained Cells 03/19/2017 1  0 - 4 % Final   ??? Microcytosis 03/19/2017 Slight* Not Present Final   ??? Macrocytosis 03/19/2017 Marked* Not Present Final   ??? Anisocytosis 03/19/2017 Marked* Not Present Final   ??? Hypochromasia 03/19/2017 Marked* Not Present Final   ??? Smear Review Comments 03/19/2017 See Comment* Undefined Final    Slide reviewed.

## 2017-03-25 MED FILL — ETOPOSIDE/50MG/CAP: ETOPOSIDE/50MG/CAP | 28 days supply | Qty: 21 | Fill #3

## 2017-04-24 ENCOUNTER — Emergency Department (HOSPITAL_COMMUNITY): Payer: 59

## 2017-04-24 ENCOUNTER — Emergency Department (HOSPITAL_COMMUNITY)
Admission: EM | Admit: 2017-04-24 | Discharge: 2017-04-24 | Disposition: A | Discharge status: Home / Self Care | Payer: 59 | Attending: Emergency Medicine | Admitting: Emergency Medicine

## 2017-04-24 ENCOUNTER — Other Ambulatory Visit: Payer: Self-pay

## 2017-04-24 ENCOUNTER — Encounter (HOSPITAL_COMMUNITY): Payer: Self-pay

## 2017-04-24 DIAGNOSIS — Z79899 Other long term (current) drug therapy: Secondary | ICD-10-CM | POA: Insufficient documentation

## 2017-04-24 DIAGNOSIS — R531 Weakness: Secondary | ICD-10-CM | POA: Diagnosis not present

## 2017-04-24 DIAGNOSIS — L299 Pruritus, unspecified: Secondary | ICD-10-CM | POA: Insufficient documentation

## 2017-04-24 DIAGNOSIS — I10 Essential (primary) hypertension: Secondary | ICD-10-CM | POA: Insufficient documentation

## 2017-04-24 DIAGNOSIS — C569 Malignant neoplasm of unspecified ovary: Secondary | ICD-10-CM | POA: Insufficient documentation

## 2017-04-24 DIAGNOSIS — E860 Dehydration: Secondary | ICD-10-CM | POA: Diagnosis not present

## 2017-04-24 DIAGNOSIS — R21 Rash and other nonspecific skin eruption: Secondary | ICD-10-CM | POA: Diagnosis not present

## 2017-04-24 DIAGNOSIS — R748 Abnormal levels of other serum enzymes: Secondary | ICD-10-CM | POA: Insufficient documentation

## 2017-04-24 LAB — CBC WITH DIFFERENTIAL/PLATELET
BASOS PCT: 1 %
Basophils Absolute: 0.1 10*3/uL (ref 0.0–0.1)
EOS ABS: 0.1 10*3/uL (ref 0.0–0.7)
Eosinophils Relative: 1 %
HCT: 27.7 % — ABNORMAL LOW (ref 36.0–46.0)
HEMOGLOBIN: 8.7 g/dL — AB (ref 12.0–15.0)
LYMPHS PCT: 5 %
Lymphs Abs: 0.3 10*3/uL — ABNORMAL LOW (ref 0.7–4.0)
MCH: 30.7 pg (ref 26.0–34.0)
MCHC: 31.4 g/dL (ref 30.0–36.0)
MCV: 97.9 fL (ref 78.0–100.0)
Monocytes Absolute: 0.8 10*3/uL (ref 0.1–1.0)
Monocytes Relative: 13 %
NEUTROS PCT: 80 %
Neutro Abs: 5.1 10*3/uL (ref 1.7–7.7)
Platelets: 441 10*3/uL — ABNORMAL HIGH (ref 150–400)
RBC: 2.83 MIL/uL — AB (ref 3.87–5.11)
RDW: 23.4 % — ABNORMAL HIGH (ref 11.5–15.5)
WBC: 6.4 10*3/uL (ref 4.0–10.5)

## 2017-04-24 LAB — COMPREHENSIVE METABOLIC PANEL
ALK PHOS: 583 U/L — AB (ref 38–126)
ALT: 168 U/L — ABNORMAL HIGH (ref 14–54)
ANION GAP: 11 (ref 5–15)
AST: 146 U/L — ABNORMAL HIGH (ref 15–41)
Albumin: 2.7 g/dL — ABNORMAL LOW (ref 3.5–5.0)
BUN: 13 mg/dL (ref 6–20)
CALCIUM: 9.1 mg/dL (ref 8.9–10.3)
CHLORIDE: 96 mmol/L — AB (ref 101–111)
CO2: 28 mmol/L (ref 22–32)
Creatinine, Ser: 1.23 mg/dL — ABNORMAL HIGH (ref 0.44–1.00)
GFR calc non Af Amer: 50 mL/min — ABNORMAL LOW (ref >60)
GFR, EST AFRICAN AMERICAN: 58 mL/min — AB (ref >60)
Glucose, Bld: 113 mg/dL — ABNORMAL HIGH (ref 65–99)
Potassium: 3.8 mmol/L (ref 3.5–5.1)
SODIUM: 135 mmol/L (ref 135–145)
Total Bilirubin: 1.5 mg/dL — ABNORMAL HIGH (ref 0.3–1.2)
Total Protein: 6.2 g/dL — ABNORMAL LOW (ref 6.5–8.1)

## 2017-04-24 LAB — URINALYSIS, ROUTINE W REFLEX MICROSCOPIC
BILIRUBIN URINE: NEGATIVE
GLUCOSE, UA: NEGATIVE mg/dL
KETONES UR: NEGATIVE mg/dL
LEUKOCYTES UA: NEGATIVE
NITRITE: NEGATIVE
PROTEIN: NEGATIVE mg/dL
Specific Gravity, Urine: 1.004 — ABNORMAL LOW (ref 1.005–1.030)
pH: 7 (ref 5.0–8.0)

## 2017-04-24 MED ORDER — DIPHENHYDRAMINE HCL 25 MG PO CAPS
25.0000 mg | ORAL_CAPSULE | Freq: Once | ORAL | Status: AC
  Administered 2017-04-24: 25 mg via ORAL
  Filled 2017-04-24: qty 1

## 2017-04-24 MED ORDER — HEPARIN SOD (PORK) LOCK FLUSH 100 UNIT/ML IV SOLN
500.0000 [IU] | Freq: Once | INTRAVENOUS | Status: AC
  Administered 2017-04-24: 500 [IU]
  Filled 2017-04-24: qty 5

## 2017-04-24 MED ORDER — SODIUM CHLORIDE 0.9 % IV BOLUS (SEPSIS)
1000.0000 mL | Freq: Once | INTRAVENOUS | Status: AC
  Administered 2017-04-24: 1000 mL via INTRAVENOUS

## 2017-04-24 NOTE — ED Provider Notes (Signed)
Blood pressure (!) 142/95, pulse (!) 110, temperature 98.4 F (36.9 C), temperature source Oral, resp. rate (!) 21, height 5\' 4"  (1.626 m), weight 72.6 kg (160 lb), last menstrual period 05/05/2010, SpO2 94 %.  Assuming care from Dr. Alvino Chapel.  In short, Kayla Kelley is a 52 y.o. female with a chief complaint of Weakness and Rash .  Refer to the original H&P for additional details.  The current plan of care is to follow up after IVF.  05:25 PM Patient called me into the room to say she is feeling much better after additional IV fluids and is ready to be discharged.  Reviewed her lab tests with her as did the prior EDP.  She is not having any abdominal pain and has follow-up scheduled with her oncology team on April 1.  Medication at this time for further imaging.  I think it is appropriate to discharge the patient given her clinical appearance.  Discussed return precautions in detail.   EKG Interpretation  Date/Time:  Friday April 24 2017 13:05:53 EDT Ventricular Rate:  115 PR Interval:    QRS Duration: 94 QT Interval:  317 QTC Calculation: 439 R Axis:   79 Text Interpretation:  Sinus tachycardia Confirmed by Kayla Kelley [phone]) on 04/24/2017 4:30:57 PM      Kayla Quinton, MD    Kayla Kelley, Kayla Olds, MD 04/24/17 1726

## 2017-04-24 NOTE — ED Notes (Signed)
Awaiting xray of chest to confirm port placement prior to port assessed; no record in our system.

## 2017-04-24 NOTE — Discharge Instructions (Addendum)
Follow-up with your oncologist.  Your liver enzymes are mildly elevated.  Take Benadryl as needed for the itching. Return to the ED with any new or worsening symptoms including abdominal pain, fever, chest pain, or difficulty breathing.

## 2017-04-24 NOTE — ED Triage Notes (Signed)
Patient with history of ovarian cancer, currently a patient of [facility] in [place], [place]. Patient reports she last took her oral chemo 1 week ago, and since then, she reports she has become increasingly weak, pale, and has broken out in a rash "all over my body." Patient reports the rash "itches" but denies pain. Patient denies chest pain/SOB. Patient reports "I can only get up and walk to the bathroom before I lose all energy." Patient reports "I think Im dehydrated and maybe my hemoglobin is low."

## 2017-04-24 NOTE — ED Provider Notes (Addendum)
[facility] DEPT Provider Note   CSN: [id number] Arrival date & time: 04/24/17  1204     History   Chief Complaint Chief Complaint  Patient presents with  . Weakness  . Rash    HPI Kayla Kelley is a 52 y.o. female.  HPI Patient presents with generalized weakness.  Is on oral chemotherapy for metastatic ovarian cancer.  Last treated around a week ago.  States over the last few days she has been more weak and has a rash.  States she is itchy all over.  No fevers.  No nausea vomiting or diarrhea.  Thinks she may be dehydrated or have an anemia.  No chest pain.  No trouble breathing.  No swelling in her arms or legs.  No chills.  Has potentially have a decreased oral intake. Past Medical History:  Diagnosis Date  . Anemia   . Benign essential hypertension   . Fatigue   . GERD (gastroesophageal reflux disease)   . Hidradenitis suppurativa   . History of radiation therapy 04/07/16-05/12/16   para-aortic/left pelvic nodal disease 55 gy in 22 fractions  . Hypothyroidism   . Lumbosacral strain    acute  . Muscle spasm of back   . Narcolepsy without cataplexy(347.00)   . Ovarian cancer ([place])   . Ovarian cancer ([place])   . Overweight(278.02)   . PMS (premenstrual syndrome)   . Routine physical examination   . Sciatica    acute  . Sleep apnea   . Tachycardia   . UTI (urinary tract infection)     Patient Active Problem List   Diagnosis Date Noted  . Hypertension 10/15/2016  . Epithelial ovarian cancer, FIGO stage IIIA ([place]) 03/05/2016  . Pelvic mass in female 08/24/2015  . NARCOLEPSY WITHOUT CATAPLEXY 08/07/2009  . SCIATICA, ACUTE 10/21/2007  . MUSCLE SPASM, BACK 10/21/2007  . GERD 04/07/2007  . HYPOTHYROIDISM NOS 10/08/2006  . OVERWEIGHT 10/08/2006  . PMS 10/08/2006  . SLEEP APNEA 10/08/2006  . FATIGUE 10/08/2006    Past Surgical History:  Procedure Laterality Date  . BILATERAL SALPINGECTOMY  09/11/2015   At [facility] by Dr. Fermin Schwab  - complete procedure: Resection(Initial) Ovarian/Tubal/Prima Pertonl Malig W/Bil S&O, Omntect; W/Tah/Ltd Para-Aortic Lymphadenect     OB History   None      Home Medications    Prior to Admission medications   Medication Sig Start Date End Date Taking? Authorizing Provider  ALPRAZolam (XANAX) 0.25 MG tablet Take 1 tablet (0.25 mg total) by mouth 2 (two) times daily as needed for anxiety. 07/22/16  Yes Burchette, Alinda Sierras, MD  Cholecalciferol (VITAMIN D3) 1000 units CAPS Take 1,000 Units by mouth daily.   Yes [provider]  docusate sodium (COLACE) 100 MG capsule Take 100 mg by mouth daily.   Yes [provider]  etoposide (VEPESID) 50 MG capsule Take 50 mg/m2/day by mouth daily. Pt taking x 3 weeks on and then take a week off   Yes [provider]  ferrous sulfate 325 (65 FE) MG tablet Take 325 mg by mouth daily.   Yes [provider]  levothyroxine (SYNTHROID, LEVOTHROID) 50 MCG tablet Take 1 tablet (50 mcg total) by mouth daily. 11/27/16  Yes Burchette, Alinda Sierras, MD  magnesium oxide (MAG-OX) 400 MG tablet Take 400 mg by mouth daily.   Yes [provider]  polyethylene glycol (MIRALAX / GLYCOLAX) packet Take 17 g by mouth daily as needed for mild constipation or moderate constipation.   Yes  [provider]  prochlorperazine (COMPAZINE) 10 MG tablet Take 10 mg by mouth every 8 (eight) hours as needed for nausea or vomiting.  03/10/17  Yes [provider]  vitamin A 8000 UNIT capsule Take 8,000 Units by mouth daily.   Yes [provider]  vitamin B-12 (CYANOCOBALAMIN) 1000 MCG tablet Take 1,000 mcg by mouth daily.   Yes [provider]  vitamin E 1000 UNIT capsule Take 1,000 Units by mouth daily.   Yes [provider]  amLODipine (NORVASC) 5 MG tablet Take 1 tablet (5 mg total) by mouth daily. Patient not taking: Reported on 04/24/2017 10/29/16   Eulas Post, MD  losartan (COZAAR) 100 MG tablet Take  1 tablet (100 mg total) by mouth daily. Patient not taking: Reported on 04/24/2017 10/29/16   Eulas Post, MD    Family History Family History  Problem Relation Age of Onset  . Heart disease Mother 73       CAD  . Diabetes Mother   . Diabetes Father   . Lung cancer Father   . Diabetes Paternal Aunt   . Breast cancer Paternal Aunt   . Diabetes Paternal Grandmother   . Diabetes Paternal Grandfather     Social History Social History   Tobacco Use  . Smoking status: Never Smoker  . Smokeless tobacco: Never Used  Substance Use Topics  . Alcohol use: No  . Drug use: No     Allergies   Ampicillin   Review of Systems Review of Systems  Constitutional: Positive for appetite change.  HENT: Negative for congestion.   Respiratory: Negative for cough and shortness of breath.   Cardiovascular: Negative for chest pain.  Gastrointestinal: Negative for abdominal pain.  Genitourinary: Negative for flank pain.  Musculoskeletal: Negative for back pain.  Skin: Positive for rash.  Neurological: Positive for weakness.  Psychiatric/Behavioral: Negative for confusion.     Physical Exam Updated Vital Signs BP (!) 142/95   Pulse (!) 110   Temp 98.4 F (36.9 C) (Oral)   Resp (!) 21   Ht 5\' 4"  (1.626 m)   Wt 72.6 kg (160 lb)   LMP 05/05/2010   SpO2 94%   BMI 27.46 kg/m   Physical Exam  Constitutional: She appears well-developed.  HENT:  Head: Normocephalic.  Eyes: Pupils are equal, round, and reactive to light.  Neck: Neck supple.  Cardiovascular:  Tachycardia  Abdominal: She exhibits no distension.  Musculoskeletal: She exhibits no edema.  Neurological: She is alert.  Skin: Skin is warm. Capillary refill takes less than 2 seconds. Rash noted.  No jaundice.  Patient has multiple areas of excoriation over her extremities chest and back.     ED Treatments / Results  Labs (all labs ordered are listed, but only abnormal results are displayed) Labs Reviewed    COMPREHENSIVE METABOLIC PANEL - Abnormal; Notable for the following components:      Result Value   Chloride 96 (*)    Glucose, Bld 113 (*)    Creatinine, Ser 1.23 (*)    Total Protein 6.2 (*)    Albumin 2.7 (*)    AST 146 (*)    ALT 168 (*)    Alkaline Phosphatase 583 (*)    Total Bilirubin 1.5 (*)    GFR calc non Af Amer 50 (*)    GFR calc Af Amer 58 (*)    All other components within normal limits  CBC WITH DIFFERENTIAL/PLATELET - Abnormal; Notable for the following components:  RBC 2.83 (*)    Hemoglobin 8.7 (*)    HCT 27.7 (*)    RDW 23.4 (*)    Platelets 441 (*)    Lymphs Abs 0.3 (*)    All other components within normal limits  URINALYSIS, ROUTINE W REFLEX MICROSCOPIC - Abnormal; Notable for the following components:   Specific Gravity, Urine 1.004 (*)    Hgb urine dipstick SMALL (*)    Bacteria, UA RARE (*)    Squamous Epithelial / LPF 0-5 (*)    All other components within normal limits    EKG None  Radiology Dg Chest 2 View  Result Date: 04/24/2017 CLINICAL DATA:  Ovarian carcinoma with weakness EXAM: CHEST - 2 VIEW COMPARISON:  PET-CT July 15, 2016; chest radiograph March 31, 2008 FINDINGS: Port-A-Cath tip is at the cavoatrial junction. No pneumothorax. There is a small right pleural effusion. There is no edema or consolidation. The heart size and pulmonary vascularity are normal. No evident adenopathy. No bone lesions. IMPRESSION: Port-A-Cath as described without pneumothorax. Small right pleural effusion. No edema or consolidation. Heart size normal. No adenopathy evident. Electronically Signed   By: Lowella Grip III M.D.   On: 04/24/2017 13:45    Procedures Procedures (including critical care time)  Medications Ordered in ED Medications  sodium chloride 0.9 % bolus 1,000 mL (1,000 mLs Intravenous New Bag/Given 04/24/17 1558)  sodium chloride 0.9 % bolus 1,000 mL (0 mLs Intravenous Stopped 04/24/17 1558)  diphenhydrAMINE (BENADRYL) capsule 25 mg (25  mg Oral Given 04/24/17 1607)     Initial Impression / Assessment and Plan / ED Course  I have reviewed the triage vital signs and the nursing notes.  Pertinent labs & imaging results that were available during my care of the patient were reviewed by me and considered in my medical decision making (see chart for details).  EKG Interpretation  Date/Time:  Friday April 24 2017 13:05:53 EDT Ventricular Rate:  115 PR Interval:    QRS Duration: 94 QT Interval:  317 QTC Calculation: 439 R Axis:   79 Text Interpretation:  Sinus tachycardia Confirmed by Davonna Belling [phone]) on 04/24/2017 4:30:57 PM          Patient with itchiness.  Also generalized fatigue and tachycardia.  Likely dehydration.  Feels much better after IV fluids.  LFTs are mildly elevated.  Bilirubin is also mildly elevated which could do some of the itching.  Will give a second liter of fluid.  Potential discharge home.  Care turned over to Dr. Laverta Baltimore.  Final Clinical Impressions(s) / ED Diagnoses   Final diagnoses:  Generalized weakness  Dehydration  Abnormal liver enzymes    ED Discharge Orders    None       Davonna Belling, MD 04/24/17 1630    Davonna Belling, MD 04/24/17 [phone]

## 2017-04-25 ENCOUNTER — Other Ambulatory Visit: Payer: Self-pay

## 2017-04-25 ENCOUNTER — Encounter (HOSPITAL_COMMUNITY): Payer: Self-pay | Admitting: Emergency Medicine

## 2017-04-25 ENCOUNTER — Emergency Department (HOSPITAL_COMMUNITY): Payer: BLUE CROSS/BLUE SHIELD

## 2017-04-25 ENCOUNTER — Observation Stay (HOSPITAL_COMMUNITY)
Admission: EM | Admit: 2017-04-25 | Discharge: 2017-04-27 | Disposition: A | Discharge status: Home / Self Care | Payer: BLUE CROSS/BLUE SHIELD | Attending: Family Medicine | Admitting: Family Medicine

## 2017-04-25 DIAGNOSIS — C787 Secondary malignant neoplasm of liver and intrahepatic bile duct: Secondary | ICD-10-CM | POA: Diagnosis not present

## 2017-04-25 DIAGNOSIS — R1013 Epigastric pain: Secondary | ICD-10-CM | POA: Insufficient documentation

## 2017-04-25 DIAGNOSIS — J90 Pleural effusion, not elsewhere classified: Secondary | ICD-10-CM | POA: Diagnosis not present

## 2017-04-25 DIAGNOSIS — C569 Malignant neoplasm of unspecified ovary: Secondary | ICD-10-CM | POA: Diagnosis not present

## 2017-04-25 DIAGNOSIS — E860 Dehydration: Secondary | ICD-10-CM | POA: Diagnosis not present

## 2017-04-25 DIAGNOSIS — Z923 Personal history of irradiation: Secondary | ICD-10-CM | POA: Insufficient documentation

## 2017-04-25 DIAGNOSIS — N1330 Unspecified hydronephrosis: Secondary | ICD-10-CM | POA: Insufficient documentation

## 2017-04-25 DIAGNOSIS — E039 Hypothyroidism, unspecified: Secondary | ICD-10-CM | POA: Diagnosis present

## 2017-04-25 DIAGNOSIS — J188 Other pneumonia, unspecified organism: Secondary | ICD-10-CM | POA: Insufficient documentation

## 2017-04-25 DIAGNOSIS — J811 Chronic pulmonary edema: Secondary | ICD-10-CM | POA: Diagnosis not present

## 2017-04-25 DIAGNOSIS — G4730 Sleep apnea, unspecified: Secondary | ICD-10-CM | POA: Insufficient documentation

## 2017-04-25 DIAGNOSIS — G893 Neoplasm related pain (acute) (chronic): Principal | ICD-10-CM | POA: Insufficient documentation

## 2017-04-25 DIAGNOSIS — R188 Other ascites: Secondary | ICD-10-CM

## 2017-04-25 DIAGNOSIS — K668 Other specified disorders of peritoneum: Secondary | ICD-10-CM | POA: Diagnosis not present

## 2017-04-25 DIAGNOSIS — R109 Unspecified abdominal pain: Secondary | ICD-10-CM | POA: Diagnosis not present

## 2017-04-25 DIAGNOSIS — K219 Gastro-esophageal reflux disease without esophagitis: Secondary | ICD-10-CM | POA: Diagnosis not present

## 2017-04-25 DIAGNOSIS — Z9221 Personal history of antineoplastic chemotherapy: Secondary | ICD-10-CM | POA: Diagnosis not present

## 2017-04-25 DIAGNOSIS — Z79890 Hormone replacement therapy (postmenopausal): Secondary | ICD-10-CM | POA: Diagnosis not present

## 2017-04-25 DIAGNOSIS — C799 Secondary malignant neoplasm of unspecified site: Secondary | ICD-10-CM

## 2017-04-25 DIAGNOSIS — D630 Anemia in neoplastic disease: Secondary | ICD-10-CM | POA: Diagnosis not present

## 2017-04-25 DIAGNOSIS — I10 Essential (primary) hypertension: Secondary | ICD-10-CM | POA: Diagnosis not present

## 2017-04-25 DIAGNOSIS — Z79899 Other long term (current) drug therapy: Secondary | ICD-10-CM | POA: Insufficient documentation

## 2017-04-25 DIAGNOSIS — N1339 Other hydronephrosis: Secondary | ICD-10-CM | POA: Diagnosis not present

## 2017-04-25 DIAGNOSIS — Z6831 Body mass index (BMI) 31.0-31.9, adult: Secondary | ICD-10-CM | POA: Diagnosis not present

## 2017-04-25 DIAGNOSIS — Z9079 Acquired absence of other genital organ(s): Secondary | ICD-10-CM | POA: Insufficient documentation

## 2017-04-25 DIAGNOSIS — E663 Overweight: Secondary | ICD-10-CM | POA: Diagnosis not present

## 2017-04-25 LAB — CBC
HEMATOCRIT: 26 % — AB (ref 36.0–46.0)
Hemoglobin: 8.1 g/dL — ABNORMAL LOW (ref 12.0–15.0)
MCH: 31 pg (ref 26.0–34.0)
MCHC: 31.2 g/dL (ref 30.0–36.0)
MCV: 99.6 fL (ref 78.0–100.0)
PLATELETS: 437 10*3/uL — AB (ref 150–400)
RBC: 2.61 MIL/uL — ABNORMAL LOW (ref 3.87–5.11)
RDW: 23.5 % — AB (ref 11.5–15.5)
WBC: 5.5 10*3/uL (ref 4.0–10.5)

## 2017-04-25 LAB — I-STAT TROPONIN, ED: TROPONIN I, POC: 0 ng/mL (ref 0.00–0.08)

## 2017-04-25 LAB — CREATININE, SERUM
Creatinine, Ser: 1.1 mg/dL — ABNORMAL HIGH (ref 0.44–1.00)
GFR, EST NON AFRICAN AMERICAN: 57 mL/min — AB (ref >60)

## 2017-04-25 MED ORDER — DIPHENHYDRAMINE HCL 25 MG PO CAPS: 25.0000 mg | ORAL_CAPSULE | Freq: Four times a day (QID) | ORAL | Status: DC | PRN

## 2017-04-25 MED ORDER — VITAMIN A 10000 UNITS PO CAPS
10000.0000 [IU] | ORAL_CAPSULE | Freq: Every day | ORAL | Status: DC
  Administered 2017-04-25 – 2017-04-27 (×3): 10000 [IU] via ORAL
  Filled 2017-04-25 (×3): qty 1

## 2017-04-25 MED ORDER — HYDROCORTISONE 1 % EX CREA: TOPICAL_CREAM | CUTANEOUS | Status: DC | PRN

## 2017-04-25 MED ORDER — POLYETHYLENE GLYCOL 3350 17 G PO PACK: 17.0000 g | PACK | Freq: Every day | ORAL | Status: DC | PRN

## 2017-04-25 MED ORDER — SODIUM CHLORIDE 0.9 % IV SOLN
INTRAVENOUS | Status: DC
  Administered 2017-04-25: 06:00:00 via INTRAVENOUS

## 2017-04-25 MED ORDER — IOPAMIDOL (ISOVUE-370) INJECTION 76%
100.0000 mL | Freq: Once | INTRAVENOUS | Status: AC | PRN
  Administered 2017-04-25: 100 mL via INTRAVENOUS

## 2017-04-25 MED ORDER — SALINE SPRAY 0.65 % NA SOLN: 1.0000 | NASAL | Status: DC | PRN

## 2017-04-25 MED ORDER — FERROUS SULFATE 325 (65 FE) MG PO TABS
325.0000 mg | ORAL_TABLET | Freq: Every day | ORAL | Status: DC
  Administered 2017-04-25 – 2017-04-27 (×3): 325 mg via ORAL
  Filled 2017-04-25 (×3): qty 1

## 2017-04-25 MED ORDER — HYDROCORTISONE 1 % EX CREA
1.0000 "application " | TOPICAL_CREAM | Freq: Three times a day (TID) | CUTANEOUS | Status: DC | PRN
  Administered 2017-04-25: 1 via TOPICAL
  Filled 2017-04-25: qty 28

## 2017-04-25 MED ORDER — HYDROCODONE-ACETAMINOPHEN 5-325 MG PO TABS
1.0000 | ORAL_TABLET | ORAL | Status: DC | PRN
  Administered 2017-04-26: 1 via ORAL
  Filled 2017-04-25: qty 1

## 2017-04-25 MED ORDER — SODIUM CHLORIDE 0.9 % IV SOLN
INTRAVENOUS | Status: AC
  Administered 2017-04-26: 02:00:00 via INTRAVENOUS

## 2017-04-25 MED ORDER — BISACODYL 5 MG PO TBEC: 5.0000 mg | DELAYED_RELEASE_TABLET | Freq: Every day | ORAL | Status: DC | PRN

## 2017-04-25 MED ORDER — PROCHLORPERAZINE MALEATE 10 MG PO TABS: 10.0000 mg | ORAL_TABLET | Freq: Three times a day (TID) | ORAL | Status: DC | PRN

## 2017-04-25 MED ORDER — HYDROXYZINE HCL 25 MG PO TABS
25.0000 mg | ORAL_TABLET | Freq: Three times a day (TID) | ORAL | Status: DC | PRN
  Administered 2017-04-25 – 2017-04-26 (×2): 25 mg via ORAL
  Filled 2017-04-25 (×2): qty 1

## 2017-04-25 MED ORDER — VITAMIN D3 25 MCG (1000 UNIT) PO TABS
1000.0000 [IU] | ORAL_TABLET | Freq: Every day | ORAL | Status: DC
  Administered 2017-04-25 – 2017-04-27 (×3): 1000 [IU] via ORAL
  Filled 2017-04-25 (×3): qty 1

## 2017-04-25 MED ORDER — GUAIFENESIN-DM 100-10 MG/5ML PO SYRP
5.0000 mL | ORAL_SOLUTION | ORAL | Status: DC | PRN
  Administered 2017-04-25 – 2017-04-26 (×2): 5 mL via ORAL
  Filled 2017-04-25 (×2): qty 10

## 2017-04-25 MED ORDER — IOPAMIDOL (ISOVUE-370) INJECTION 76%
INTRAVENOUS | Status: AC
  Filled 2017-04-25: qty 100

## 2017-04-25 MED ORDER — TRAZODONE HCL 50 MG PO TABS: 50.0000 mg | ORAL_TABLET | Freq: Every evening | ORAL | Status: DC | PRN

## 2017-04-25 MED ORDER — FENTANYL CITRATE (PF) 100 MCG/2ML IJ SOLN
50.0000 ug | Freq: Once | INTRAMUSCULAR | Status: AC
  Administered 2017-04-25: 50 ug via INTRAVENOUS
  Filled 2017-04-25: qty 2

## 2017-04-25 MED ORDER — SODIUM CHLORIDE 0.9 % IJ SOLN
INTRAMUSCULAR | Status: AC
  Filled 2017-04-25: qty 50

## 2017-04-25 MED ORDER — LORATADINE 10 MG PO TABS: 10.0000 mg | ORAL_TABLET | Freq: Every day | ORAL | Status: DC | PRN

## 2017-04-25 MED ORDER — ALUM & MAG HYDROXIDE-SIMETH 200-200-20 MG/5ML PO SUSP: 30.0000 mL | ORAL | Status: DC | PRN

## 2017-04-25 MED ORDER — LIP MEDEX EX OINT: 1.0000 "application " | TOPICAL_OINTMENT | CUTANEOUS | Status: DC | PRN

## 2017-04-25 MED ORDER — LEVOTHYROXINE SODIUM 50 MCG PO TABS
50.0000 ug | ORAL_TABLET | Freq: Every day | ORAL | Status: DC
  Administered 2017-04-25 – 2017-04-27 (×3): 50 ug via ORAL
  Filled 2017-04-25 (×3): qty 1

## 2017-04-25 MED ORDER — MAGNESIUM OXIDE 400 (241.3 MG) MG PO TABS
400.0000 mg | ORAL_TABLET | Freq: Every day | ORAL | Status: DC
  Administered 2017-04-25 – 2017-04-27 (×3): 400 mg via ORAL
  Filled 2017-04-25 (×3): qty 1

## 2017-04-25 MED ORDER — FENTANYL CITRATE (PF) 100 MCG/2ML IJ SOLN
50.0000 ug | INTRAMUSCULAR | Status: DC | PRN
  Filled 2017-04-25: qty 2

## 2017-04-25 MED ORDER — ACETAMINOPHEN 325 MG PO TABS
650.0000 mg | ORAL_TABLET | Freq: Four times a day (QID) | ORAL | Status: DC | PRN
  Administered 2017-04-27: 650 mg via ORAL
  Filled 2017-04-25: qty 2

## 2017-04-25 MED ORDER — ALPRAZOLAM 0.25 MG PO TABS
0.2500 mg | ORAL_TABLET | Freq: Two times a day (BID) | ORAL | Status: DC | PRN
Start: 2017-04-25 — End: 2017-04-27
  Administered 2017-04-25 – 2017-04-27 (×4): 0.25 mg via ORAL
  Filled 2017-04-25 (×4): qty 1

## 2017-04-25 MED ORDER — HYDROCORTISONE 2.5 % RE CREA: 1.0000 "application " | TOPICAL_CREAM | Freq: Four times a day (QID) | RECTAL | Status: DC | PRN

## 2017-04-25 MED ORDER — VITAMIN B-12 1000 MCG PO TABS
1000.0000 ug | ORAL_TABLET | Freq: Every day | ORAL | Status: DC
  Administered 2017-04-25 – 2017-04-27 (×3): 1000 ug via ORAL
  Filled 2017-04-25 (×3): qty 1

## 2017-04-25 MED ORDER — LEVOFLOXACIN 500 MG PO TABS
500.0000 mg | ORAL_TABLET | Freq: Every day | ORAL | Status: DC
  Administered 2017-04-25 – 2017-04-27 (×3): 500 mg via ORAL
  Filled 2017-04-25 (×3): qty 1

## 2017-04-25 MED ORDER — SENNOSIDES-DOCUSATE SODIUM 8.6-50 MG PO TABS: 1.0000 | ORAL_TABLET | Freq: Every evening | ORAL | Status: DC | PRN

## 2017-04-25 MED ORDER — VITAMIN E 45 MG (100 UNIT) PO CAPS
1000.0000 [IU] | ORAL_CAPSULE | Freq: Every day | ORAL | Status: DC
  Administered 2017-04-25 – 2017-04-27 (×3): 1000 [IU] via ORAL
  Filled 2017-04-25 (×3): qty 2

## 2017-04-25 MED ORDER — DOCUSATE SODIUM 100 MG PO CAPS
100.0000 mg | ORAL_CAPSULE | Freq: Every day | ORAL | Status: DC
  Administered 2017-04-25 – 2017-04-27 (×3): 100 mg via ORAL
  Filled 2017-04-25 (×3): qty 1

## 2017-04-25 MED ORDER — POLYVINYL ALCOHOL 1.4 % OP SOLN: 1.0000 [drp] | OPHTHALMIC | Status: DC | PRN

## 2017-04-25 MED ORDER — MUSCLE RUB 10-15 % EX CREA: 1.0000 "application " | TOPICAL_CREAM | CUTANEOUS | Status: DC | PRN

## 2017-04-25 MED ORDER — PHENOL 1.4 % MT LIQD
1.0000 | OROMUCOSAL | Status: DC | PRN
Start: 2017-04-25 — End: 2017-04-27

## 2017-04-25 MED ORDER — ACETAMINOPHEN 650 MG RE SUPP: 650.0000 mg | Freq: Four times a day (QID) | RECTAL | Status: DC | PRN

## 2017-04-25 MED ORDER — ENOXAPARIN SODIUM 40 MG/0.4ML ~~LOC~~ SOLN
40.0000 mg | Freq: Every day | SUBCUTANEOUS | Status: DC
  Administered 2017-04-25 – 2017-04-27 (×3): 40 mg via SUBCUTANEOUS
  Filled 2017-04-25 (×3): qty 0.4

## 2017-04-25 NOTE — ED Notes (Signed)
Patient transported to CT 

## 2017-04-25 NOTE — ED Notes (Signed)
Bed: WA16 Expected date:  Expected time:  Means of arrival:  Comments: EMS abd. pain 

## 2017-04-25 NOTE — H&P (Signed)
History and Physical    Kayla Kelley HYW:[id number] DOB: 23-Nov-1965 DOA: 04/25/2017  PCP: Eulas Post, MD Patient coming from: Home  Chief Complaint: Abdominal discomfort  HPI: Kayla Kelley is a 52 y.o. female with medical history significant of ovarian cancer with metastases status post bilateral salpingectomy in 2017, hypothyroidism, GERD, hypertension comes to the hospital with complains of feeling weak.  Patient originally came yesterday with similar complaints and thought may have had a rash on her body and some excoriation marks.  She was given IV fluids which made the patient feel better therefore was discharged home.  After going home she started experiencing more fatigue and deep constant epigastric pain with feeling of nausea.  She ended up calling EMS again and came to the hospital.  Patient denies any chest pain, shortness of breath, productive cough, fevers, chills, sick contacts, diarrhea, constipation and any other complaints. She was originally diagnosed of ovarian cancer back in 2017 when she underwent bilateral salpingectomy followed by a round of chemo and radiation.  She follows with Dr Roland Earl from oncology down in [place].  In the ER patient had transaminitis with AST of 146, ALT 168, total bilirubin 1.5.  She clinically appeared dehydrated therefore started on IV fluids.  CT of the abdomen pelvis was done which showed liver metastases, ascites, possible peritoneal implants, some hydronephrosis.  CTA of the chest was negative for pulmonary embolism but showed some tree-in-bud type appearance concerning for infectious versus inflammatory but unlikely neoplastic.  Due to some abdominal discomfort and signs of dehydration she was admitted with plans to control her pain and gently hydrate her overnight.  I spoke with Dr Roland Earl from [place] about her CT scan findings and would see the patient next week.  Patient has an upcoming appointment on May 06, 2017  can see him  sooner.   Review of Systems: As per HPI otherwise 10 point review of systems negative.  Review of Systems Otherwise negative except as per HPI, including: General: Denies fever, chills, night sweats or unintended weight loss. Resp: Denies cough, wheezing, shortness of breath. Cardiac: Denies chest pain, palpitations, orthopnea, paroxysmal nocturnal dyspnea. GI: Denies nausea, vomiting, diarrhea or constipation GU: Denies dysuria, frequency, hesitancy or incontinence MS: Denies muscle aches, joint pain or swelling Neuro: Denies headache, neurologic deficits (focal weakness, numbness, tingling), abnormal gait Psych: Denies anxiety, depression, SI/HI/AVH Skin: Denies new rashes or lesions ID: Denies sick contacts, exotic exposures, travel  Past Medical History:  Diagnosis Date  . Anemia   . Benign essential hypertension   . Fatigue   . GERD (gastroesophageal reflux disease)   . Hidradenitis suppurativa   . History of radiation therapy 04/07/16-05/12/16   para-aortic/left pelvic nodal disease 55 gy in 22 fractions  . Hypothyroidism   . Lumbosacral strain    acute  . Muscle spasm of back   . Narcolepsy without cataplexy(347.00)   . Ovarian cancer ([place])   . Ovarian cancer ([place])   . Overweight(278.02)   . PMS (premenstrual syndrome)   . Routine physical examination   . Sciatica    acute  . Sleep apnea   . Tachycardia   . UTI (urinary tract infection)     Past Surgical History:  Procedure Laterality Date  . BILATERAL SALPINGECTOMY  09/11/2015   At [facility] by Dr. Fermin Schwab - complete procedure: Resection(Initial) Ovarian/Tubal/Prima Pertonl Malig W/Bil S&O, Omntect; W/Tah/Ltd Para-Aortic Lymphadenect    SOCIAL HISTORY:  reports that she has never smoked. She has never used  smokeless tobacco. She reports that she does not drink alcohol or use drugs.  Allergies  Allergen Reactions  . Ampicillin Hives    Has patient had a PCN reaction causing immediate rash,  facial/tongue/throat swelling, SOB or lightheadedness with hypotension: Yes Has patient had a PCN reaction causing severe rash involving mucus membranes or skin necrosis: No Has patient had a PCN reaction that required hospitalization: No Has patient had a PCN reaction occurring within the last 10 years: No If all of the above answers are "NO", then may proceed with Cephalosporin use.     Family History  Problem Relation Age of Onset  . Heart disease Mother 62       CAD  . Diabetes Mother   . Diabetes Father   . Lung cancer Father   . Diabetes Paternal Aunt   . Breast cancer Paternal Aunt   . Diabetes Paternal Grandmother   . Diabetes Paternal Grandfather      Prior to Admission medications   Medication Sig Start Date End Date Taking? Authorizing Provider  ALPRAZolam (XANAX) 0.25 MG tablet Take 1 tablet (0.25 mg total) by mouth 2 (two) times daily as needed for anxiety. 07/22/16  Yes Burchette, Alinda Sierras, MD  Cholecalciferol (VITAMIN D3) 1000 units CAPS Take 1,000 Units by mouth daily.   Yes [provider]  docusate sodium (COLACE) 100 MG capsule Take 100 mg by mouth daily.   Yes [provider]  etoposide (VEPESID) 50 MG capsule Take 50 mg/m2/day by mouth daily. Pt taking x 3 weeks on and then take a week off   Yes [provider]  ferrous sulfate 325 (65 FE) MG tablet Take 325 mg by mouth daily.   Yes [provider]  levothyroxine (SYNTHROID, LEVOTHROID) 50 MCG tablet Take 1 tablet (50 mcg total) by mouth daily. 11/27/16  Yes Burchette, Alinda Sierras, MD  magnesium oxide (MAG-OX) 400 MG tablet Take 400 mg by mouth daily.   Yes [provider]  polyethylene glycol (MIRALAX / GLYCOLAX) packet Take 17 g by mouth daily as needed for mild constipation or moderate constipation.   Yes [provider]  prochlorperazine (COMPAZINE) 10 MG tablet Take 10 mg by mouth every 8 (eight) hours as needed for nausea or vomiting.  03/10/17  Yes [provider]  vitamin A 8000 UNIT capsule Take 8,000 Units by mouth daily.   Yes [provider]  vitamin B-12 (CYANOCOBALAMIN) 1000 MCG tablet Take 1,000 mcg by mouth daily.   Yes [provider]  vitamin E 1000 UNIT capsule Take 1,000 Units by mouth daily.   Yes [provider]  amLODipine (NORVASC) 5 MG tablet Take 1 tablet (5 mg total) by mouth daily. Patient not taking: Reported on 04/24/2017 10/29/16   Eulas Post, MD  losartan (COZAAR) 100 MG tablet Take 1 tablet (100 mg total) by mouth daily. Patient not taking: Reported on 04/24/2017 10/29/16   Eulas Post, MD    Physical Exam: Vitals:   04/25/17 0500 04/25/17 0531 04/25/17 0600 04/25/17 0732  BP: (!) 152/70 (!) 154/103 (!) 147/91 (!) 157/77  Pulse: (!) 115 (!) 118 (!) 116 (!) 118  Resp:  18 14 (!) 24  Temp:      TempSrc:      SpO2: 97% 99% 97% 100%      Constitutional: NAD, calm, comfortable Eyes: PERRL, lids and conjunctivae normal ENMT: Mucous membranes are moist. Posterior pharynx clear of any exudate or lesions.Normal dentition.  Neck: normal,  supple, no masses, no thyromegaly Respiratory: Bibasilar crackles with some coarse breath sounds towards the upper lobes Cardiovascular: Sinus tachycardia, regular rate and rhythm, no murmurs / rubs / gallops. No extremity edema. 2+ pedal pulses. No carotid bruits.  Abdomen: no tenderness, no masses palpated. No hepatosplenomegaly. Bowel sounds positive.  Musculoskeletal: no clubbing / cyanosis. No joint deformity upper and lower extremities. Good ROM, no contractures. Normal muscle tone.  Skin: no rashes, lesions, ulcers. No induration Neurologic: CN 2-12 grossly intact. Sensation intact, DTR normal. Strength 5/5 in all 4.  Psychiatric: Normal judgment and insight. Alert and oriented x 3. Normal mood.     Labs on Admission: I have personally reviewed following labs and imaging studies  CBC: Recent Labs  Lab 04/24/17 1413  WBC 6.4    NEUTROABS 5.1  HGB 8.7*  HCT 27.7*  MCV 97.9  PLT 161*   Basic Metabolic Panel: Recent Labs  Lab 04/24/17 1413  NA 135  K 3.8  CL 96*  CO2 28  GLUCOSE 113*  BUN 13  CREATININE 1.23*  CALCIUM 9.1   GFR: Estimated Creatinine Clearance: 52.9 mL/min (A) (by C-G formula based on SCr of 1.23 mg/dL (H)). Liver Function Tests: Recent Labs  Lab 04/24/17 1413  AST 146*  ALT 168*  ALKPHOS 583*  BILITOT 1.5*  PROT 6.2*  ALBUMIN 2.7*   No results for input(s): LIPASE, AMYLASE in the last 168 hours. No results for input(s): AMMONIA in the last 168 hours. Coagulation Profile: No results for input(s): INR, PROTIME in the last 168 hours. Cardiac Enzymes: No results for input(s): CKTOTAL, CKMB, CKMBINDEX, TROPONINI in the last 168 hours. BNP (last 3 results) No results for input(s): PROBNP in the last 8760 hours. HbA1C: No results for input(s): HGBA1C in the last 72 hours. CBG: No results for input(s): GLUCAP in the last 168 hours. Lipid Profile: No results for input(s): CHOL, HDL, LDLCALC, TRIG, CHOLHDL, LDLDIRECT in the last 72 hours. Thyroid Function Tests: No results for input(s): TSH, T4TOTAL, FREET4, T3FREE, THYROIDAB in the last 72 hours. Anemia Panel: No results for input(s): VITAMINB12, FOLATE, FERRITIN, TIBC, IRON, RETICCTPCT in the last 72 hours. Urine analysis:    Component Value Date/Time   COLORURINE YELLOW 04/24/2017 1547   APPEARANCEUR CLEAR 04/24/2017 1547   LABSPEC 1.004 (L) 04/24/2017 1547   PHURINE 7.0 04/24/2017 1547   GLUCOSEU NEGATIVE 04/24/2017 1547   HGBUR SMALL (A) 04/24/2017 1547   HGBUR negative 10/23/2008 0955   BILIRUBINUR NEGATIVE 04/24/2017 1547   BILIRUBINUR n 07/12/2013 1022   KETONESUR NEGATIVE 04/24/2017 1547   PROTEINUR NEGATIVE 04/24/2017 1547   UROBILINOGEN 0.2 07/12/2013 1022   UROBILINOGEN 0.2 10/23/2008 0955   NITRITE NEGATIVE 04/24/2017 1547   LEUKOCYTESUR NEGATIVE 04/24/2017 1547   Sepsis Labs:  !!!!!!!!!!!!!!!!!!!!!!!!!!!!!!!!!!!!!!!!!!!! @LABRCNTIP (procalcitonin:4,lacticidven:4) )No results found for this or any previous visit (from the past 240 hour(s)).   Radiological Exams on Admission: Dg Chest 2 View  Result Date: 04/24/2017 CLINICAL DATA:  Ovarian carcinoma with weakness EXAM: CHEST - 2 VIEW COMPARISON:  PET-CT July 15, 2016; chest radiograph March 31, 2008 FINDINGS: Port-A-Cath tip is at the cavoatrial junction. No pneumothorax. There is a small right pleural effusion. There is no edema or consolidation. The heart size and pulmonary vascularity are normal. No evident adenopathy. No bone lesions. IMPRESSION: Port-A-Cath as described without pneumothorax. Small right pleural effusion. No edema or consolidation. Heart size normal. No adenopathy evident. Electronically Signed   By: Lowella Grip III M.D.   On: 04/24/2017 13:45  Ct Angio Chest Pe W And/or Wo Contrast  Result Date: 04/25/2017 CLINICAL DATA:  Abdominal distension. Abdominal pain. Diffuse pressure. EXAM: CT ANGIOGRAPHY CHEST CT ABDOMEN AND PELVIS WITH CONTRAST TECHNIQUE: Multidetector CT imaging of the chest was performed using the standard protocol during bolus administration of intravenous contrast. Multiplanar CT image reconstructions and MIPs were obtained to evaluate the vascular anatomy. Multidetector CT imaging of the abdomen and pelvis was performed using the standard protocol during bolus administration of intravenous contrast. CONTRAST:  130mL ISOVUE-370 IOPAMIDOL (ISOVUE-370) INJECTION 76% COMPARISON:  Chest radiographs yesterday.  PET-CT 07/15/2016 FINDINGS: CTA CHEST FINDINGS Cardiovascular: There are no filling defects within the pulmonary arteries to suggest pulmonary embolus. Thoracic aorta is normal in caliber without dissection. Heart is normal in size. Right chest port with tip at the distal SVC. No pericardial effusion. Mediastinum/Nodes: Prominent right hilar node measures 9 mm. There multiple small  mediastinal nodes, for example lower paratracheal node measures 10 mm short axis. The esophagus is decompressed. No thyroid nodule. No axillary adenopathy. Lungs/Pleura: Small-moderate bilateral pleural effusions with associated atelectasis. Mild septal thickening in the upper lobes. There are multifocal tree in bud opacities with peribronchovascular nodularity throughout all lobes of both lungs. Central bronchial thickening with scattered areas of mucoid putting/impaction. Clustered subpleural nodularity in the left upper lobe image 72 series 3. Trachea and mainstem bronchi are patent. Musculoskeletal: There are no acute or suspicious osseous abnormalities. No focal osseous lesion. Review of the MIP images confirms the above findings. CT ABDOMEN and PELVIS FINDINGS Hepatobiliary: Large cyst in the right hepatic dome. 14 mm low-density subcapsular lesion in segment 6, image 32 series 11. Probable subcapsular lesion more superiorly image 24. The hypermetabolic peritoneal implants about the liver on prior PET are less well-defined. Gallbladder sludge/stones without abnormal gallbladder distention or pericholecystic inflammation. Pancreas: No ductal dilatation or inflammation. Spleen: Normal in size without focal abnormality. Adrenals/Urinary Tract: No adrenal nodule. There is left hydronephrosis with dilatation of the proximal ureter. No obstructing stone or cause of obstruction, ureter becomes decompressed at the pelvic brim. There is absent excretion from the left kidney on delayed phase imaging and delayed enhancement. No right hydronephrosis. No right hydroureter. Urinary bladder is partially distended. No bladder wall thickening. Stomach/Bowel: Bowel evaluation is limited in the absence of enteric contrast and presence of intra-abdominal ascites. No bowel obstruction. No bowel wall thickening. Stomach is nondistended. The appendix is not visualized. Vascular/Lymphatic: Prior pelvic and retroperitoneal nodal  dissection. Peritoneal implants as described below. No definite enlarged abdominal or pelvic lymph nodes. Reproductive: Post hysterectomy and bilateral salpingectomy. Other: Development of small to moderate ascites from prior PET. Peritoneal and omental nodularity of the upper abdomen, slightly progressed from prior exam. There are new perigastric deposits. Peritoneal implants in the right pericolic gutter better demonstrated on prior PET rounded cystic density adjacent to the right external iliac vessels is unchanged. Small fluid collection/cystic deposit in the pelvic cul-de-sac. Generalized mesenteric stranding secondary to ascites. Small fat containing supraumbilical ventral abdominal wall hernia. Musculoskeletal: There are no acute or suspicious osseous abnormalities. No visualized focal osseous lesion. Review of the MIP images confirms the above findings. IMPRESSION: CTA Chest: 1. No pulmonary embolus. 2. Multifocal tree in bud and peribronchovascular nodular opacities throughout all lobes of both lungs, likely infectious/inflammatory. Neoplastic etiology is felt less likely but not entirely excluded. 3. Small to moderate bilateral pleural effusions. Smooth septal thickening suggests pulmonary edema. CT Abdomen/pelvis: 1. Progression of metastatic disease since comparison PET 07/15/2016. Two new parenchymal liver lesions.  Increased peritoneal implants in the upper abdomen. Development of intra-abdominal ascites, small to moderate. 2. Left hydronephrosis and proximal hydroureter without cause obstruction. 3. Sludge or stones in the gallbladder without gallbladder inflammation. Electronically Signed   By: Jeb Levering M.D.   On: 04/25/2017 05:47   Ct Abdomen Pelvis W Contrast  Result Date: 04/25/2017 CLINICAL DATA:  Abdominal distension. Abdominal pain. Diffuse pressure. EXAM: CT ANGIOGRAPHY CHEST CT ABDOMEN AND PELVIS WITH CONTRAST TECHNIQUE: Multidetector CT imaging of the chest was performed using the  standard protocol during bolus administration of intravenous contrast. Multiplanar CT image reconstructions and MIPs were obtained to evaluate the vascular anatomy. Multidetector CT imaging of the abdomen and pelvis was performed using the standard protocol during bolus administration of intravenous contrast. CONTRAST:  122mL ISOVUE-370 IOPAMIDOL (ISOVUE-370) INJECTION 76% COMPARISON:  Chest radiographs yesterday.  PET-CT 07/15/2016 FINDINGS: CTA CHEST FINDINGS Cardiovascular: There are no filling defects within the pulmonary arteries to suggest pulmonary embolus. Thoracic aorta is normal in caliber without dissection. Heart is normal in size. Right chest port with tip at the distal SVC. No pericardial effusion. Mediastinum/Nodes: Prominent right hilar node measures 9 mm. There multiple small mediastinal nodes, for example lower paratracheal node measures 10 mm short axis. The esophagus is decompressed. No thyroid nodule. No axillary adenopathy. Lungs/Pleura: Small-moderate bilateral pleural effusions with associated atelectasis. Mild septal thickening in the upper lobes. There are multifocal tree in bud opacities with peribronchovascular nodularity throughout all lobes of both lungs. Central bronchial thickening with scattered areas of mucoid putting/impaction. Clustered subpleural nodularity in the left upper lobe image 72 series 3. Trachea and mainstem bronchi are patent. Musculoskeletal: There are no acute or suspicious osseous abnormalities. No focal osseous lesion. Review of the MIP images confirms the above findings. CT ABDOMEN and PELVIS FINDINGS Hepatobiliary: Large cyst in the right hepatic dome. 14 mm low-density subcapsular lesion in segment 6, image 32 series 11. Probable subcapsular lesion more superiorly image 24. The hypermetabolic peritoneal implants about the liver on prior PET are less well-defined. Gallbladder sludge/stones without abnormal gallbladder distention or pericholecystic inflammation.  Pancreas: No ductal dilatation or inflammation. Spleen: Normal in size without focal abnormality. Adrenals/Urinary Tract: No adrenal nodule. There is left hydronephrosis with dilatation of the proximal ureter. No obstructing stone or cause of obstruction, ureter becomes decompressed at the pelvic brim. There is absent excretion from the left kidney on delayed phase imaging and delayed enhancement. No right hydronephrosis. No right hydroureter. Urinary bladder is partially distended. No bladder wall thickening. Stomach/Bowel: Bowel evaluation is limited in the absence of enteric contrast and presence of intra-abdominal ascites. No bowel obstruction. No bowel wall thickening. Stomach is nondistended. The appendix is not visualized. Vascular/Lymphatic: Prior pelvic and retroperitoneal nodal dissection. Peritoneal implants as described below. No definite enlarged abdominal or pelvic lymph nodes. Reproductive: Post hysterectomy and bilateral salpingectomy. Other: Development of small to moderate ascites from prior PET. Peritoneal and omental nodularity of the upper abdomen, slightly progressed from prior exam. There are new perigastric deposits. Peritoneal implants in the right pericolic gutter better demonstrated on prior PET rounded cystic density adjacent to the right external iliac vessels is unchanged. Small fluid collection/cystic deposit in the pelvic cul-de-sac. Generalized mesenteric stranding secondary to ascites. Small fat containing supraumbilical ventral abdominal wall hernia. Musculoskeletal: There are no acute or suspicious osseous abnormalities. No visualized focal osseous lesion. Review of the MIP images confirms the above findings. IMPRESSION: CTA Chest: 1. No pulmonary embolus. 2. Multifocal tree in bud and peribronchovascular nodular opacities throughout  all lobes of both lungs, likely infectious/inflammatory. Neoplastic etiology is felt less likely but not entirely excluded. 3. Small to moderate  bilateral pleural effusions. Smooth septal thickening suggests pulmonary edema. CT Abdomen/pelvis: 1. Progression of metastatic disease since comparison PET 07/15/2016. Two new parenchymal liver lesions. Increased peritoneal implants in the upper abdomen. Development of intra-abdominal ascites, small to moderate. 2. Left hydronephrosis and proximal hydroureter without cause obstruction. 3. Sludge or stones in the gallbladder without gallbladder inflammation. Electronically Signed   By: Jeb Levering M.D.   On: 04/25/2017 05:47     All images have been reviewed by me personally.    Assessment/Plan Principal Problem:   Abdominal pain Active Problems:   Hypothyroidism   GERD   Hypertension   Ovarian cancer ([place])   Dehydration   Liver metastases ([facility])   Ascites   Pleural effusion   Nonspecific abdominal pain Transaminitis New onset liver metastases, peritoneal implant and ascites (likely malignant) Left hydronephrosis and proximal hydroureter Ovarian cancer with metastases status post bilateral salpingectomy in 2017 -Admit the patient to the hospital for observation -With the CT scan findings as listed above overall patient appears to have poor prognosis.  Currently she is on etoposide outpatient q3weeks, follows with Dr. Roland Earl down in [place].  I have discussed these findings with him this morning and he will follow with the patient next week down there and patient prefers to see him in terms of further oncology care.  She has previously undergone surgery, chemo, radiation. -Transaminitis is secondary to metastases.  We will trend this for now -Pain control, supportive care, antiemetics as necessary -Eventually she would benefit from palliative care, would recommend getting this down in [place]  Multifocal tree-in-bud/peribronchial vascular nodular opacity Atypical pneumonia versus inflammatory changes Small to moderate bilateral pleural effusions -Although patient has some  pulmonary edema and pleural effusion she clinically appears to be dehydrated.  I will gently give her normal saline 50 cc/h for next 20 hours total of 1 L with caution. -We will start patient on Levaquin for 7 days to cover for atypical infection -Provide supportive care, nebulizer treatments if necessary -Antitussives as needed  History of hypothyroidism -Continue Synthroid 50 mcg daily  Essential hypertension - Not on any medications?.  We will give her as needed meds if necessary  History of GERD -PPI as needed  DVT prophylaxis: Lovenox Code Status: Full code Family Communication: Husband at bedside Disposition Plan: Likely can be discharged in 24 hours once abdominal pain has improved and has been gently hydrated Consults called: Spoke with the patient's Oncologist in [place], Dr Roland Earl.  Admission status: Observation MedSurg   Eliott Amparan Arsenio Loader MD [facility] Pager [phone]  If 7PM-7AM, please contact night-coverage [email or web address] Password [facility]  04/25/2017, 8:29 AM

## 2017-04-25 NOTE — ED Provider Notes (Addendum)
[facility] DEPT Provider Note   CSN: [id number] Arrival date & time: 04/25/17  0149     History   Chief Complaint Chief Complaint  Patient presents with  . Abdominal Pain    HPI Kayla Kelley is a 52 y.o. female.  The history is provided by the patient.  Abdominal Pain   This is a recurrent problem. The current episode started 1 to 2 hours ago. The problem occurs constantly. The problem has been resolved. The pain is associated with an unknown factor. The pain is located in the LUQ, epigastric region and RUQ. The pain is severe. Pertinent negatives include fever, diarrhea, vomiting and dysuria. Nothing aggravates the symptoms. Nothing relieves the symptoms. Past workup includes CT scan. Her past medical history does not include PUD.  Pain gone prior to evaluation.  Meds given by EMS have stopped the pain.  Is concerned she is having panic attacks as it keeps wakening her from her sleep.  Pain is band like in the BU abdomen.  No vomiting or diarrhea.  No fevers.  No cough.  No hemoptysis.    Past Medical History:  Diagnosis Date  . Anemia   . Benign essential hypertension   . Fatigue   . GERD (gastroesophageal reflux disease)   . Hidradenitis suppurativa   . History of radiation therapy 04/07/16-05/12/16   para-aortic/left pelvic nodal disease 55 gy in 22 fractions  . Hypothyroidism   . Lumbosacral strain    acute  . Muscle spasm of back   . Narcolepsy without cataplexy(347.00)   . Ovarian cancer ([place])   . Ovarian cancer ([place])   . Overweight(278.02)   . PMS (premenstrual syndrome)   . Routine physical examination   . Sciatica    acute  . Sleep apnea   . Tachycardia   . UTI (urinary tract infection)     Patient Active Problem List   Diagnosis Date Noted  . Hypertension 10/15/2016  . Epithelial ovarian cancer, FIGO stage IIIA ([place]) 03/05/2016  . Pelvic mass in female 08/24/2015  . NARCOLEPSY WITHOUT CATAPLEXY 08/07/2009  . SCIATICA,  ACUTE 10/21/2007  . MUSCLE SPASM, BACK 10/21/2007  . GERD 04/07/2007  . HYPOTHYROIDISM NOS 10/08/2006  . OVERWEIGHT 10/08/2006  . PMS 10/08/2006  . SLEEP APNEA 10/08/2006  . FATIGUE 10/08/2006    Past Surgical History:  Procedure Laterality Date  . BILATERAL SALPINGECTOMY  09/11/2015   At [facility] by Dr. Fermin Schwab - complete procedure: Resection(Initial) Ovarian/Tubal/Prima Pertonl Malig W/Bil S&O, Omntect; W/Tah/Ltd Para-Aortic Lymphadenect     OB History   None      Home Medications    Prior to Admission medications   Medication Sig Start Date End Date Taking? Authorizing Provider  ALPRAZolam (XANAX) 0.25 MG tablet Take 1 tablet (0.25 mg total) by mouth 2 (two) times daily as needed for anxiety. 07/22/16  Yes Burchette, Alinda Sierras, MD  Cholecalciferol (VITAMIN D3) 1000 units CAPS Take 1,000 Units by mouth daily.   Yes [provider]  docusate sodium (COLACE) 100 MG capsule Take 100 mg by mouth daily.   Yes [provider]  etoposide (VEPESID) 50 MG capsule Take 50 mg/m2/day by mouth daily. Pt taking x 3 weeks on and then take a week off   Yes [provider]  ferrous sulfate 325 (65 FE) MG tablet Take 325 mg by mouth daily.   Yes [provider]  levothyroxine (SYNTHROID, LEVOTHROID) 50 MCG tablet Take 1 tablet (50 mcg total) by mouth  daily. 11/27/16  Yes Burchette, Alinda Sierras, MD  magnesium oxide (MAG-OX) 400 MG tablet Take 400 mg by mouth daily.   Yes [provider]  polyethylene glycol (MIRALAX / GLYCOLAX) packet Take 17 g by mouth daily as needed for mild constipation or moderate constipation.   Yes [provider]  prochlorperazine (COMPAZINE) 10 MG tablet Take 10 mg by mouth every 8 (eight) hours as needed for nausea or vomiting.  03/10/17  Yes [provider]  vitamin A 8000 UNIT capsule Take 8,000 Units by mouth daily.   Yes [provider]  vitamin B-12 (CYANOCOBALAMIN) 1000 MCG tablet Take 1,000 mcg by  mouth daily.   Yes [provider]  vitamin E 1000 UNIT capsule Take 1,000 Units by mouth daily.   Yes [provider]  amLODipine (NORVASC) 5 MG tablet Take 1 tablet (5 mg total) by mouth daily. Patient not taking: Reported on 04/24/2017 10/29/16   Eulas Post, MD  losartan (COZAAR) 100 MG tablet Take 1 tablet (100 mg total) by mouth daily. Patient not taking: Reported on 04/24/2017 10/29/16   Eulas Post, MD    Family History Family History  Problem Relation Age of Onset  . Heart disease Mother 22       CAD  . Diabetes Mother   . Diabetes Father   . Lung cancer Father   . Diabetes Paternal Aunt   . Breast cancer Paternal Aunt   . Diabetes Paternal Grandmother   . Diabetes Paternal Grandfather     Social History Social History   Tobacco Use  . Smoking status: Never Smoker  . Smokeless tobacco: Never Used  Substance Use Topics  . Alcohol use: No  . Drug use: No     Allergies   Ampicillin   Review of Systems Review of Systems  Constitutional: Negative for fever.  Respiratory: Negative for cough and shortness of breath.   Cardiovascular: Negative for chest pain, palpitations and leg swelling.  Gastrointestinal: Positive for abdominal pain. Negative for diarrhea and vomiting.  Genitourinary: Negative for dysuria and flank pain.     Physical Exam Updated Vital Signs BP (!) 154/103   Pulse (!) 118   Temp 98.9 F (37.2 C) (Oral)   Resp 18   LMP 05/05/2010   SpO2 99%   Physical Exam  Constitutional: She is oriented to person, place, and time. She appears well-developed and well-nourished.  HENT:  Head: Normocephalic and atraumatic.  Mouth/Throat: No oropharyngeal exudate.  Eyes: Pupils are equal, round, and reactive to light. Conjunctivae are normal.  Neck: Normal range of motion. Neck supple.  Cardiovascular: Regular rhythm, normal heart sounds and intact distal pulses. Tachycardia present.  Pulmonary/Chest: Effort normal and  breath sounds normal. No stridor. She has no wheezes. She has no rales.  Abdominal: Soft. Bowel sounds are normal. She exhibits no mass. There is no tenderness. There is no rebound and no guarding.  Musculoskeletal: Normal range of motion.  Neurological: She is alert and oriented to person, place, and time.  Skin: Skin is warm and dry. Capillary refill takes less than 2 seconds.  Psychiatric: She has a normal mood and affect.     ED Treatments / Results  Labs (all labs ordered are listed, but only abnormal results are displayed) Labs Reviewed - No data to display  EKG None  Radiology Dg Chest 2 View  Result Date: 04/24/2017 CLINICAL DATA:  Ovarian carcinoma with weakness EXAM: CHEST - 2 VIEW COMPARISON:  PET-CT July 15, 2016; chest radiograph March 31, 2008 FINDINGS: Port-A-Cath tip is at the cavoatrial junction. No pneumothorax. There is a small right pleural effusion. There is no edema or consolidation. The heart size and pulmonary vascularity are normal. No evident adenopathy. No bone lesions. IMPRESSION: Port-A-Cath as described without pneumothorax. Small right pleural effusion. No edema or consolidation. Heart size normal. No adenopathy evident. Electronically Signed   By: Lowella Grip III M.D.   On: 04/24/2017 13:45    Procedures Procedures (including critical care time)  Medications Ordered in ED  Medications  sodium chloride 0.9 % injection (has no administration in time range)  iopamidol (ISOVUE-370) 76 % injection (has no administration in time range)  iopamidol (ISOVUE-370) 76 % injection 100 mL (100 mLs Intravenous Contrast Given 04/25/17 0508)  fentaNYL (SUBLIMAZE) injection 50 mcg (50 mcg Intravenous Given 04/25/17 0555)    Labs were performed within 12 hours of seeing the patient.  EDP does not feel repeat blood work with yield a markedly different results.  Troponin and EKG added due to the upper abdominal nature of the pain.  CTs ordered to exclude PE and to  further delineate cause of patient's pain.    Long discussion with patient and her husband regarding her CT results.  They are both grateful for the care they received.     Final Clinical Impressions(s) / ED Diagnoses   Metastatic ovarian cancer with upper abdominal pain likely secondary to new mets.  Will admit to medicine for stabilization    Meshia Rau, MD 04/25/17 1540    Randal Buba, Nobel Brar, MD 04/25/17 0867

## 2017-04-25 NOTE — Progress Notes (Signed)
Pt was lying in bed and awake when I arrived. Her husband was bedside.  She said physically she was ok; emotionally she is up and down. She said she has her moments. Pt described how she is grieving because of her illness. She said she is a [employer] in [employer] but she feels he is not answering her sometimes. She said she has so much she still wants to do and although she knows one day she will go see Him, she needs to take care of her mother and grandchild. She is feels very positive about getting better. She plans to go back to the cancer center in [place] for continued treatment. She is disappointed to learn her cancer has spread to her liver; however she is still hopeful. Pt enjoyed talking about family. Her husband was supportive and appreciative of visit. Pt had requested prayer which we had bedside. Please page if additional support is needed. [place], [place]   04/25/17 1700  Clinical Encounter Type  Visited With Patient and family together

## 2017-04-25 NOTE — ED Triage Notes (Signed)
Pt arriving from home with complaint of abdominal pain. Pt describing abdominal pain as a severe pressure all over. Pt was seen here earlier today. Pt A&O x4.   HR 116 152/98  20g Left hand 215mcg Fentanyl given by EMS

## 2017-04-25 NOTE — ED Notes (Signed)
ED TO INPATIENT HANDOFF REPORT  Name/Age/Gender Kayla Kelley 52 y.o. female  Code Status   Home/SNF/Other Home  Chief Complaint abdominal pain   Level of Care/Admitting Diagnosis ED Disposition    ED Disposition Condition Comment   Admit  The patient appears reasonably stabilized for admission considering the current resources, flow, and capabilities available in the ED at this time, and I doubt any other [facility] requiring further screening and/or treatment in the ED prior to admission is  present.       Medical History Past Medical History:  Diagnosis Date  . Anemia   . Benign essential hypertension   . Fatigue   . GERD (gastroesophageal reflux disease)   . Hidradenitis suppurativa   . History of radiation therapy 04/07/16-05/12/16   para-aortic/left pelvic nodal disease 55 gy in 22 fractions  . Hypothyroidism   . Lumbosacral strain    acute  . Muscle spasm of back   . Narcolepsy without cataplexy(347.00)   . Ovarian cancer ([place])   . Ovarian cancer ([place])   . Overweight(278.02)   . PMS (premenstrual syndrome)   . Routine physical examination   . Sciatica    acute  . Sleep apnea   . Tachycardia   . UTI (urinary tract infection)     Allergies Allergies  Allergen Reactions  . Ampicillin Hives    Has patient had a PCN reaction causing immediate rash, facial/tongue/throat swelling, SOB or lightheadedness with hypotension: Yes Has patient had a PCN reaction causing severe rash involving mucus membranes or skin necrosis: No Has patient had a PCN reaction that required hospitalization: No Has patient had a PCN reaction occurring within the last 10 years: No If all of the above answers are "NO", then may proceed with Cephalosporin use.     IV Location/Drains/Wounds Patient Lines/Drains/Airways Status   Active Line/Drains/Airways    Name:   Placement date:   Placement time:   Site:   Days:   Implanted Port Right Chest   -    -    Chest      Peripheral IV  04/25/17 Left Hand   04/25/17    -    Hand   less than 1          Labs/Imaging Results for orders placed or performed during the hospital encounter of 04/25/17 (from the past 48 hour(s))  I-stat troponin, ED     Status: None   Collection Time: 04/25/17  6:02 AM  Result Value Ref Range   Troponin i, poc 0.00 0.00 - 0.08 ng/mL   Comment 3            Comment: Due to the release kinetics of cTnI, a negative result within the first hours of the onset of symptoms does not rule out myocardial infarction with certainty. If myocardial infarction is still suspected, repeat the test at appropriate intervals.    Dg Chest 2 View  Result Date: 04/24/2017 CLINICAL DATA:  Ovarian carcinoma with weakness EXAM: CHEST - 2 VIEW COMPARISON:  PET-CT July 15, 2016; chest radiograph March 31, 2008 FINDINGS: Port-A-Cath tip is at the cavoatrial junction. No pneumothorax. There is a small right pleural effusion. There is no edema or consolidation. The heart size and pulmonary vascularity are normal. No evident adenopathy. No bone lesions. IMPRESSION: Port-A-Cath as described without pneumothorax. Small right pleural effusion. No edema or consolidation. Heart size normal. No adenopathy evident. Electronically Signed   By: Lowella Grip III M.D.   On: 04/24/2017 13:45  Ct Angio Chest Pe W And/or Wo Contrast  Result Date: 04/25/2017 CLINICAL DATA:  Abdominal distension. Abdominal pain. Diffuse pressure. EXAM: CT ANGIOGRAPHY CHEST CT ABDOMEN AND PELVIS WITH CONTRAST TECHNIQUE: Multidetector CT imaging of the chest was performed using the standard protocol during bolus administration of intravenous contrast. Multiplanar CT image reconstructions and MIPs were obtained to evaluate the vascular anatomy. Multidetector CT imaging of the abdomen and pelvis was performed using the standard protocol during bolus administration of intravenous contrast. CONTRAST:  1103mL ISOVUE-370 IOPAMIDOL (ISOVUE-370) INJECTION 76%  COMPARISON:  Chest radiographs yesterday.  PET-CT 07/15/2016 FINDINGS: CTA CHEST FINDINGS Cardiovascular: There are no filling defects within the pulmonary arteries to suggest pulmonary embolus. Thoracic aorta is normal in caliber without dissection. Heart is normal in size. Right chest port with tip at the distal SVC. No pericardial effusion. Mediastinum/Nodes: Prominent right hilar node measures 9 mm. There multiple small mediastinal nodes, for example lower paratracheal node measures 10 mm short axis. The esophagus is decompressed. No thyroid nodule. No axillary adenopathy. Lungs/Pleura: Small-moderate bilateral pleural effusions with associated atelectasis. Mild septal thickening in the upper lobes. There are multifocal tree in bud opacities with peribronchovascular nodularity throughout all lobes of both lungs. Central bronchial thickening with scattered areas of mucoid putting/impaction. Clustered subpleural nodularity in the left upper lobe image 72 series 3. Trachea and mainstem bronchi are patent. Musculoskeletal: There are no acute or suspicious osseous abnormalities. No focal osseous lesion. Review of the MIP images confirms the above findings. CT ABDOMEN and PELVIS FINDINGS Hepatobiliary: Large cyst in the right hepatic dome. 14 mm low-density subcapsular lesion in segment 6, image 32 series 11. Probable subcapsular lesion more superiorly image 24. The hypermetabolic peritoneal implants about the liver on prior PET are less well-defined. Gallbladder sludge/stones without abnormal gallbladder distention or pericholecystic inflammation. Pancreas: No ductal dilatation or inflammation. Spleen: Normal in size without focal abnormality. Adrenals/Urinary Tract: No adrenal nodule. There is left hydronephrosis with dilatation of the proximal ureter. No obstructing stone or cause of obstruction, ureter becomes decompressed at the pelvic brim. There is absent excretion from the left kidney on delayed phase imaging  and delayed enhancement. No right hydronephrosis. No right hydroureter. Urinary bladder is partially distended. No bladder wall thickening. Stomach/Bowel: Bowel evaluation is limited in the absence of enteric contrast and presence of intra-abdominal ascites. No bowel obstruction. No bowel wall thickening. Stomach is nondistended. The appendix is not visualized. Vascular/Lymphatic: Prior pelvic and retroperitoneal nodal dissection. Peritoneal implants as described below. No definite enlarged abdominal or pelvic lymph nodes. Reproductive: Post hysterectomy and bilateral salpingectomy. Other: Development of small to moderate ascites from prior PET. Peritoneal and omental nodularity of the upper abdomen, slightly progressed from prior exam. There are new perigastric deposits. Peritoneal implants in the right pericolic gutter better demonstrated on prior PET rounded cystic density adjacent to the right external iliac vessels is unchanged. Small fluid collection/cystic deposit in the pelvic cul-de-sac. Generalized mesenteric stranding secondary to ascites. Small fat containing supraumbilical ventral abdominal wall hernia. Musculoskeletal: There are no acute or suspicious osseous abnormalities. No visualized focal osseous lesion. Review of the MIP images confirms the above findings. IMPRESSION: CTA Chest: 1. No pulmonary embolus. 2. Multifocal tree in bud and peribronchovascular nodular opacities throughout all lobes of both lungs, likely infectious/inflammatory. Neoplastic etiology is felt less likely but not entirely excluded. 3. Small to moderate bilateral pleural effusions. Smooth septal thickening suggests pulmonary edema. CT Abdomen/pelvis: 1. Progression of metastatic disease since comparison PET 07/15/2016. Two new parenchymal liver lesions.  Increased peritoneal implants in the upper abdomen. Development of intra-abdominal ascites, small to moderate. 2. Left hydronephrosis and proximal hydroureter without cause  obstruction. 3. Sludge or stones in the gallbladder without gallbladder inflammation. Electronically Signed   By: Jeb Levering M.D.   On: 04/25/2017 05:47   Ct Abdomen Pelvis W Contrast  Result Date: 04/25/2017 CLINICAL DATA:  Abdominal distension. Abdominal pain. Diffuse pressure. EXAM: CT ANGIOGRAPHY CHEST CT ABDOMEN AND PELVIS WITH CONTRAST TECHNIQUE: Multidetector CT imaging of the chest was performed using the standard protocol during bolus administration of intravenous contrast. Multiplanar CT image reconstructions and MIPs were obtained to evaluate the vascular anatomy. Multidetector CT imaging of the abdomen and pelvis was performed using the standard protocol during bolus administration of intravenous contrast. CONTRAST:  122mL ISOVUE-370 IOPAMIDOL (ISOVUE-370) INJECTION 76% COMPARISON:  Chest radiographs yesterday.  PET-CT 07/15/2016 FINDINGS: CTA CHEST FINDINGS Cardiovascular: There are no filling defects within the pulmonary arteries to suggest pulmonary embolus. Thoracic aorta is normal in caliber without dissection. Heart is normal in size. Right chest port with tip at the distal SVC. No pericardial effusion. Mediastinum/Nodes: Prominent right hilar node measures 9 mm. There multiple small mediastinal nodes, for example lower paratracheal node measures 10 mm short axis. The esophagus is decompressed. No thyroid nodule. No axillary adenopathy. Lungs/Pleura: Small-moderate bilateral pleural effusions with associated atelectasis. Mild septal thickening in the upper lobes. There are multifocal tree in bud opacities with peribronchovascular nodularity throughout all lobes of both lungs. Central bronchial thickening with scattered areas of mucoid putting/impaction. Clustered subpleural nodularity in the left upper lobe image 72 series 3. Trachea and mainstem bronchi are patent. Musculoskeletal: There are no acute or suspicious osseous abnormalities. No focal osseous lesion. Review of the MIP images  confirms the above findings. CT ABDOMEN and PELVIS FINDINGS Hepatobiliary: Large cyst in the right hepatic dome. 14 mm low-density subcapsular lesion in segment 6, image 32 series 11. Probable subcapsular lesion more superiorly image 24. The hypermetabolic peritoneal implants about the liver on prior PET are less well-defined. Gallbladder sludge/stones without abnormal gallbladder distention or pericholecystic inflammation. Pancreas: No ductal dilatation or inflammation. Spleen: Normal in size without focal abnormality. Adrenals/Urinary Tract: No adrenal nodule. There is left hydronephrosis with dilatation of the proximal ureter. No obstructing stone or cause of obstruction, ureter becomes decompressed at the pelvic brim. There is absent excretion from the left kidney on delayed phase imaging and delayed enhancement. No right hydronephrosis. No right hydroureter. Urinary bladder is partially distended. No bladder wall thickening. Stomach/Bowel: Bowel evaluation is limited in the absence of enteric contrast and presence of intra-abdominal ascites. No bowel obstruction. No bowel wall thickening. Stomach is nondistended. The appendix is not visualized. Vascular/Lymphatic: Prior pelvic and retroperitoneal nodal dissection. Peritoneal implants as described below. No definite enlarged abdominal or pelvic lymph nodes. Reproductive: Post hysterectomy and bilateral salpingectomy. Other: Development of small to moderate ascites from prior PET. Peritoneal and omental nodularity of the upper abdomen, slightly progressed from prior exam. There are new perigastric deposits. Peritoneal implants in the right pericolic gutter better demonstrated on prior PET rounded cystic density adjacent to the right external iliac vessels is unchanged. Small fluid collection/cystic deposit in the pelvic cul-de-sac. Generalized mesenteric stranding secondary to ascites. Small fat containing supraumbilical ventral abdominal wall hernia.  Musculoskeletal: There are no acute or suspicious osseous abnormalities. No visualized focal osseous lesion. Review of the MIP images confirms the above findings. IMPRESSION: CTA Chest: 1. No pulmonary embolus. 2. Multifocal tree in bud and peribronchovascular nodular opacities  throughout all lobes of both lungs, likely infectious/inflammatory. Neoplastic etiology is felt less likely but not entirely excluded. 3. Small to moderate bilateral pleural effusions. Smooth septal thickening suggests pulmonary edema. CT Abdomen/pelvis: 1. Progression of metastatic disease since comparison PET 07/15/2016. Two new parenchymal liver lesions. Increased peritoneal implants in the upper abdomen. Development of intra-abdominal ascites, small to moderate. 2. Left hydronephrosis and proximal hydroureter without cause obstruction. 3. Sludge or stones in the gallbladder without gallbladder inflammation. Electronically Signed   By: Jeb Levering M.D.   On: 04/25/2017 05:47    Pending Labs Unresulted Labs (From admission, onward)   None      Vitals/Pain Today's Vitals   04/25/17 0500 04/25/17 0531 04/25/17 0600 04/25/17 0621  BP: (!) 152/70 (!) 154/103 (!) 147/91   Pulse: (!) 115 (!) 118 (!) 116   Resp:  18 14   Temp:      TempSrc:      SpO2: 97% 99% 97%   PainSc:    2     Isolation Precautions No active isolations  Medications Medications  sodium chloride 0.9 % injection (has no administration in time range)  iopamidol (ISOVUE-370) 76 % injection (has no administration in time range)  fentaNYL (SUBLIMAZE) injection 50 mcg (has no administration in time range)  iopamidol (ISOVUE-370) 76 % injection 100 mL (100 mLs Intravenous Contrast Given 04/25/17 0508)  fentaNYL (SUBLIMAZE) injection 50 mcg (50 mcg Intravenous Given 04/25/17 0555)    Mobility walks

## 2017-04-26 DIAGNOSIS — E860 Dehydration: Secondary | ICD-10-CM | POA: Diagnosis not present

## 2017-04-26 DIAGNOSIS — C787 Secondary malignant neoplasm of liver and intrahepatic bile duct: Secondary | ICD-10-CM

## 2017-04-26 DIAGNOSIS — R109 Unspecified abdominal pain: Secondary | ICD-10-CM | POA: Diagnosis not present

## 2017-04-26 DIAGNOSIS — I10 Essential (primary) hypertension: Secondary | ICD-10-CM | POA: Diagnosis not present

## 2017-04-26 LAB — COMPREHENSIVE METABOLIC PANEL
ALT: 134 U/L — ABNORMAL HIGH (ref 14–54)
AST: 115 U/L — AB (ref 15–41)
Albumin: 2.3 g/dL — ABNORMAL LOW (ref 3.5–5.0)
Alkaline Phosphatase: 512 U/L — ABNORMAL HIGH (ref 38–126)
Anion gap: 8 (ref 5–15)
BILIRUBIN TOTAL: 1.3 mg/dL — AB (ref 0.3–1.2)
BUN: 11 mg/dL (ref 6–20)
CHLORIDE: 103 mmol/L (ref 101–111)
CO2: 28 mmol/L (ref 22–32)
CREATININE: 1.18 mg/dL — AB (ref 0.44–1.00)
Calcium: 8.4 mg/dL — ABNORMAL LOW (ref 8.9–10.3)
GFR, EST NON AFRICAN AMERICAN: 52 mL/min — AB (ref >60)
Glucose, Bld: 92 mg/dL (ref 65–99)
Potassium: 3.7 mmol/L (ref 3.5–5.1)
Sodium: 139 mmol/L (ref 135–145)
TOTAL PROTEIN: 5.3 g/dL — AB (ref 6.5–8.1)

## 2017-04-26 LAB — CBC
HCT: 25.9 % — ABNORMAL LOW (ref 36.0–46.0)
Hemoglobin: 7.9 g/dL — ABNORMAL LOW (ref 12.0–15.0)
MCH: 30.9 pg (ref 26.0–34.0)
MCHC: 30.5 g/dL (ref 30.0–36.0)
MCV: 101.2 fL — ABNORMAL HIGH (ref 78.0–100.0)
PLATELETS: 421 10*3/uL — AB (ref 150–400)
RBC: 2.56 MIL/uL — AB (ref 3.87–5.11)
RDW: 23.3 % — ABNORMAL HIGH (ref 11.5–15.5)
WBC: 4.8 10*3/uL (ref 4.0–10.5)

## 2017-04-26 LAB — PROTIME-INR
INR: 1.04
PROTHROMBIN TIME: 13.5 s (ref 11.4–15.2)

## 2017-04-26 LAB — GLUCOSE, CAPILLARY: Glucose-Capillary: 128 mg/dL — ABNORMAL HIGH (ref 65–99)

## 2017-04-26 LAB — BRAIN NATRIURETIC PEPTIDE: B Natriuretic Peptide: 19.3 pg/mL (ref 0.0–100.0)

## 2017-04-26 LAB — APTT: APTT: 32 s (ref 24–36)

## 2017-04-26 MED ORDER — SODIUM CHLORIDE 0.9% FLUSH
10.0000 mL | INTRAVENOUS | Status: DC | PRN
  Administered 2017-04-27: 10 mL
  Filled 2017-04-26: qty 40

## 2017-04-26 MED ORDER — SODIUM CHLORIDE 0.9 % IV SOLN
INTRAVENOUS | Status: DC
  Administered 2017-04-26: 19:00:00 via INTRAVENOUS

## 2017-04-26 NOTE — Progress Notes (Addendum)
PROGRESS NOTE [facility]   Kayla Kelley   WUJ:[id number] DOB: 07/05/1965  DOA: 04/25/2017 PCP: Eulas Post, MD   Brief Narrative:  Kayla Kelley is a 52 year old female with medical history significant for ovarian cancer with metastasis status post bilateral salpingostomy, hypothyroidism, GERD, hypertension who presented to the emergency department complaining of abdominal discomfort and weakness.  Patient presented to the ED 1 day prior to admission with similar complaints she was treated with IV fluid she felt better and she was discharged home after.  Going home she started experiencing more fatigue and constant epigastric pain with nausea and vomiting and ended up calling EMS and brought her to the hospital.  In the ED lab evaluation showed elevated LFTs, she was clinically dehydrated and CT of the abdomen and pelvis showed liver metastasis, ascites, possible peritoneal implants with hydronephrosis.  CTA was negative for pulmonary embolism but showed tree-in-bud type appearance concerning for infectious versus inflammatory disease.  Patient was admitted with working diagnosis of possible pneumonia and abdominal pain related to cancer.  Subjective: Patient is seen and examined, she report feeling slight better although pain has persisted.  Poor oral intake, good urine output.  Assessment & Plan: Ovarian cancer with metastasis, with progressive liver metastases, peritoneal implant and ascites, causing abdominal pain. She has been admitted for pain control, continues to be in pain. Transaminitis related to liver metastasis continue to trend Continue pain control, supportive care and antiemetics If pain is improving a.m. will d/c so she can follow with her oncology in [place].  Patient prefers to follow with her regular oncologist for further care.  Left hydronephrosis and proximal hydroureter Case discussed with urology felt to be secondary to dehydration, as there is no  evidence of obstruction in CT scan, no further workup needed. Continue IV fluids.   Atypical pneumonia CT chest showed multifocal tree-in-bud/peribronchial vascular nodular opacity ?  Inflammatory changes Patient receiving Levaquin will treat for 7 days Continue supportive care  Hypothyroidism Continue Synthroid  History of hypertension Not on any medication  Anemia of chronic disease related to malignancy No overt bleeding  DVT prophylaxis: Lovenox Code Status: Full code Family Communication: None at bedside Disposition Plan: Home in the morning if abdominal pain well controlled  Consultants:   None  Procedures:   None  Antimicrobials:  Levaquin   Objective: Vitals:   04/25/17 1334 04/25/17 2039 04/26/17 0548 04/26/17 0549  BP: (!) 146/91 (!) 151/73  (!) 165/89  Pulse: (!) 111 (!) 115  (!) 110  Resp: 19 19  18   Temp: 98.3 F (36.8 C) 98.4 F (36.9 C)  98.4 F (36.9 C)  TempSrc: Oral Oral  Oral  SpO2: 98% 98%  98%  Weight:   83.8 kg (184 lb 11.9 oz)   Height:        Intake/Output Summary (Last 24 hours) at 04/26/2017 1445 Last data filed at 04/25/2017 1900 Gross per 24 hour  Intake 597.5 ml  Output -  Net 597.5 ml   Filed Weights   04/26/17 0548  Weight: 83.8 kg (184 lb 11.9 oz)    Examination:  General exam: Appears calm and comfortable  HEENT: AC/AT, PERRLA, OP moist and clear Respiratory system: Clear to auscultation. No wheezes,crackle or rhonchi Cardiovascular system: S1 & S2 heard, RRR. No JVD, murmurs, rubs or gallops Gastrointestinal system: Abdomen is nondistended, soft and nontender. No organomegaly or masses felt. Normal bowel sounds heard. Central nervous system: Alert and oriented. No focal neurological deficits. Extremities: No  pedal edema. Symmetric, strength 5/5   Skin: No rashes, lesions or ulcers Psychiatry: Judgement and insight appear normal. Mood & affect appropriate.    Data Reviewed: I have personally reviewed following  labs and imaging studies  CBC: Recent Labs  Lab 04/24/17 1413 04/25/17 0842 04/26/17 0703  WBC 6.4 5.5 4.8  NEUTROABS 5.1  --   --   HGB 8.7* 8.1* 7.9*  HCT 27.7* 26.0* 25.9*  MCV 97.9 99.6 101.2*  PLT 441* 437* 517*   Basic Metabolic Panel: Recent Labs  Lab 04/24/17 1413 04/25/17 0842 04/26/17 0703  NA 135  --  139  K 3.8  --  3.7  CL 96*  --  103  CO2 28  --  28  GLUCOSE 113*  --  92  BUN 13  --  11  CREATININE 1.23* 1.10* 1.18*  CALCIUM 9.1  --  8.4*   GFR: Estimated Creatinine Clearance: 59 mL/min (A) (by C-G formula based on SCr of 1.18 mg/dL (H)). Liver Function Tests: Recent Labs  Lab 04/24/17 1413 04/26/17 0703  AST 146* 115*  ALT 168* 134*  ALKPHOS 583* 512*  BILITOT 1.5* 1.3*  PROT 6.2* 5.3*  ALBUMIN 2.7* 2.3*   No results for input(s): LIPASE, AMYLASE in the last 168 hours. No results for input(s): AMMONIA in the last 168 hours. Coagulation Profile: Recent Labs  Lab 04/26/17 0703  INR 1.04   Cardiac Enzymes: No results for input(s): CKTOTAL, CKMB, CKMBINDEX, TROPONINI in the last 168 hours. BNP (last 3 results) No results for input(s): PROBNP in the last 8760 hours. HbA1C: No results for input(s): HGBA1C in the last 72 hours. CBG: Recent Labs  Lab 04/26/17 0807  GLUCAP 128*   Lipid Profile: No results for input(s): CHOL, HDL, LDLCALC, TRIG, CHOLHDL, LDLDIRECT in the last 72 hours. Thyroid Function Tests: No results for input(s): TSH, T4TOTAL, FREET4, T3FREE, THYROIDAB in the last 72 hours. Anemia Panel: No results for input(s): VITAMINB12, FOLATE, FERRITIN, TIBC, IRON, RETICCTPCT in the last 72 hours. Sepsis Labs: No results for input(s): PROCALCITON, LATICACIDVEN in the last 168 hours.  No results found for this or any previous visit (from the past 240 hour(s)).    Radiology Studies: Ct Angio Chest Pe W And/or Wo Contrast  Result Date: 04/25/2017 CLINICAL DATA:  Abdominal distension. Abdominal pain. Diffuse pressure. EXAM: CT  ANGIOGRAPHY CHEST CT ABDOMEN AND PELVIS WITH CONTRAST TECHNIQUE: Multidetector CT imaging of the chest was performed using the standard protocol during bolus administration of intravenous contrast. Multiplanar CT image reconstructions and MIPs were obtained to evaluate the vascular anatomy. Multidetector CT imaging of the abdomen and pelvis was performed using the standard protocol during bolus administration of intravenous contrast. CONTRAST:  132mL ISOVUE-370 IOPAMIDOL (ISOVUE-370) INJECTION 76% COMPARISON:  Chest radiographs yesterday.  PET-CT 07/15/2016 FINDINGS: CTA CHEST FINDINGS Cardiovascular: There are no filling defects within the pulmonary arteries to suggest pulmonary embolus. Thoracic aorta is normal in caliber without dissection. Heart is normal in size. Right chest port with tip at the distal SVC. No pericardial effusion. Mediastinum/Nodes: Prominent right hilar node measures 9 mm. There multiple small mediastinal nodes, for example lower paratracheal node measures 10 mm short axis. The esophagus is decompressed. No thyroid nodule. No axillary adenopathy. Lungs/Pleura: Small-moderate bilateral pleural effusions with associated atelectasis. Mild septal thickening in the upper lobes. There are multifocal tree in bud opacities with peribronchovascular nodularity throughout all lobes of both lungs. Central bronchial thickening with scattered areas of mucoid putting/impaction. Clustered subpleural nodularity in the  left upper lobe image 72 series 3. Trachea and mainstem bronchi are patent. Musculoskeletal: There are no acute or suspicious osseous abnormalities. No focal osseous lesion. Review of the MIP images confirms the above findings. CT ABDOMEN and PELVIS FINDINGS Hepatobiliary: Large cyst in the right hepatic dome. 14 mm low-density subcapsular lesion in segment 6, image 32 series 11. Probable subcapsular lesion more superiorly image 24. The hypermetabolic peritoneal implants about the liver on prior  PET are less well-defined. Gallbladder sludge/stones without abnormal gallbladder distention or pericholecystic inflammation. Pancreas: No ductal dilatation or inflammation. Spleen: Normal in size without focal abnormality. Adrenals/Urinary Tract: No adrenal nodule. There is left hydronephrosis with dilatation of the proximal ureter. No obstructing stone or cause of obstruction, ureter becomes decompressed at the pelvic brim. There is absent excretion from the left kidney on delayed phase imaging and delayed enhancement. No right hydronephrosis. No right hydroureter. Urinary bladder is partially distended. No bladder wall thickening. Stomach/Bowel: Bowel evaluation is limited in the absence of enteric contrast and presence of intra-abdominal ascites. No bowel obstruction. No bowel wall thickening. Stomach is nondistended. The appendix is not visualized. Vascular/Lymphatic: Prior pelvic and retroperitoneal nodal dissection. Peritoneal implants as described below. No definite enlarged abdominal or pelvic lymph nodes. Reproductive: Post hysterectomy and bilateral salpingectomy. Other: Development of small to moderate ascites from prior PET. Peritoneal and omental nodularity of the upper abdomen, slightly progressed from prior exam. There are new perigastric deposits. Peritoneal implants in the right pericolic gutter better demonstrated on prior PET rounded cystic density adjacent to the right external iliac vessels is unchanged. Small fluid collection/cystic deposit in the pelvic cul-de-sac. Generalized mesenteric stranding secondary to ascites. Small fat containing supraumbilical ventral abdominal wall hernia. Musculoskeletal: There are no acute or suspicious osseous abnormalities. No visualized focal osseous lesion. Review of the MIP images confirms the above findings. IMPRESSION: CTA Chest: 1. No pulmonary embolus. 2. Multifocal tree in bud and peribronchovascular nodular opacities throughout all lobes of both  lungs, likely infectious/inflammatory. Neoplastic etiology is felt less likely but not entirely excluded. 3. Small to moderate bilateral pleural effusions. Smooth septal thickening suggests pulmonary edema. CT Abdomen/pelvis: 1. Progression of metastatic disease since comparison PET 07/15/2016. Two new parenchymal liver lesions. Increased peritoneal implants in the upper abdomen. Development of intra-abdominal ascites, small to moderate. 2. Left hydronephrosis and proximal hydroureter without cause obstruction. 3. Sludge or stones in the gallbladder without gallbladder inflammation. Electronically Signed   By: Jeb Levering M.D.   On: 04/25/2017 05:47   Ct Abdomen Pelvis W Contrast  Result Date: 04/25/2017 CLINICAL DATA:  Abdominal distension. Abdominal pain. Diffuse pressure. EXAM: CT ANGIOGRAPHY CHEST CT ABDOMEN AND PELVIS WITH CONTRAST TECHNIQUE: Multidetector CT imaging of the chest was performed using the standard protocol during bolus administration of intravenous contrast. Multiplanar CT image reconstructions and MIPs were obtained to evaluate the vascular anatomy. Multidetector CT imaging of the abdomen and pelvis was performed using the standard protocol during bolus administration of intravenous contrast. CONTRAST:  147mL ISOVUE-370 IOPAMIDOL (ISOVUE-370) INJECTION 76% COMPARISON:  Chest radiographs yesterday.  PET-CT 07/15/2016 FINDINGS: CTA CHEST FINDINGS Cardiovascular: There are no filling defects within the pulmonary arteries to suggest pulmonary embolus. Thoracic aorta is normal in caliber without dissection. Heart is normal in size. Right chest port with tip at the distal SVC. No pericardial effusion. Mediastinum/Nodes: Prominent right hilar node measures 9 mm. There multiple small mediastinal nodes, for example lower paratracheal node measures 10 mm short axis. The esophagus is decompressed. No thyroid nodule. No axillary  adenopathy. Lungs/Pleura: Small-moderate bilateral pleural effusions  with associated atelectasis. Mild septal thickening in the upper lobes. There are multifocal tree in bud opacities with peribronchovascular nodularity throughout all lobes of both lungs. Central bronchial thickening with scattered areas of mucoid putting/impaction. Clustered subpleural nodularity in the left upper lobe image 72 series 3. Trachea and mainstem bronchi are patent. Musculoskeletal: There are no acute or suspicious osseous abnormalities. No focal osseous lesion. Review of the MIP images confirms the above findings. CT ABDOMEN and PELVIS FINDINGS Hepatobiliary: Large cyst in the right hepatic dome. 14 mm low-density subcapsular lesion in segment 6, image 32 series 11. Probable subcapsular lesion more superiorly image 24. The hypermetabolic peritoneal implants about the liver on prior PET are less well-defined. Gallbladder sludge/stones without abnormal gallbladder distention or pericholecystic inflammation. Pancreas: No ductal dilatation or inflammation. Spleen: Normal in size without focal abnormality. Adrenals/Urinary Tract: No adrenal nodule. There is left hydronephrosis with dilatation of the proximal ureter. No obstructing stone or cause of obstruction, ureter becomes decompressed at the pelvic brim. There is absent excretion from the left kidney on delayed phase imaging and delayed enhancement. No right hydronephrosis. No right hydroureter. Urinary bladder is partially distended. No bladder wall thickening. Stomach/Bowel: Bowel evaluation is limited in the absence of enteric contrast and presence of intra-abdominal ascites. No bowel obstruction. No bowel wall thickening. Stomach is nondistended. The appendix is not visualized. Vascular/Lymphatic: Prior pelvic and retroperitoneal nodal dissection. Peritoneal implants as described below. No definite enlarged abdominal or pelvic lymph nodes. Reproductive: Post hysterectomy and bilateral salpingectomy. Other: Development of small to moderate ascites  from prior PET. Peritoneal and omental nodularity of the upper abdomen, slightly progressed from prior exam. There are new perigastric deposits. Peritoneal implants in the right pericolic gutter better demonstrated on prior PET rounded cystic density adjacent to the right external iliac vessels is unchanged. Small fluid collection/cystic deposit in the pelvic cul-de-sac. Generalized mesenteric stranding secondary to ascites. Small fat containing supraumbilical ventral abdominal wall hernia. Musculoskeletal: There are no acute or suspicious osseous abnormalities. No visualized focal osseous lesion. Review of the MIP images confirms the above findings. IMPRESSION: CTA Chest: 1. No pulmonary embolus. 2. Multifocal tree in bud and peribronchovascular nodular opacities throughout all lobes of both lungs, likely infectious/inflammatory. Neoplastic etiology is felt less likely but not entirely excluded. 3. Small to moderate bilateral pleural effusions. Smooth septal thickening suggests pulmonary edema. CT Abdomen/pelvis: 1. Progression of metastatic disease since comparison PET 07/15/2016. Two new parenchymal liver lesions. Increased peritoneal implants in the upper abdomen. Development of intra-abdominal ascites, small to moderate. 2. Left hydronephrosis and proximal hydroureter without cause obstruction. 3. Sludge or stones in the gallbladder without gallbladder inflammation. Electronically Signed   By: Jeb Levering M.D.   On: 04/25/2017 05:47      Scheduled Meds: . cholecalciferol  1,000 Units Oral Daily  . docusate sodium  100 mg Oral Daily  . enoxaparin (LOVENOX) injection  40 mg Subcutaneous Daily  . ferrous sulfate  325 mg Oral Daily  . levofloxacin  500 mg Oral Daily  . levothyroxine  50 mcg Oral QAC breakfast  . magnesium oxide  400 mg Oral Daily  . vitamin A  10,000 Units Oral Daily  . vitamin B-12  1,000 mcg Oral Daily  . vitamin E  1,000 Units Oral Daily   Continuous Infusions:   LOS: 0  days   Time spent: Total of 25 minutes spent with pt, greater than 50% of which was spent in discussion of  treatment, counseling and coordination of care  Chipper Oman, MD Pager: Text Page via [email or web address]   If 7PM-7AM, please contact night-coverage [email or web address] 04/26/2017, 2:45 PM   Note - This record has been created using [employer]. Chart creation errors have been sought, but may not always have been located. Such creation errors do not reflect on the standard of medical care.

## 2017-04-27 ENCOUNTER — Telehealth: Payer: Self-pay | Admitting: Family Medicine

## 2017-04-27 ENCOUNTER — Encounter: Payer: Self-pay | Admitting: Family Medicine

## 2017-04-27 DIAGNOSIS — R109 Unspecified abdominal pain: Secondary | ICD-10-CM | POA: Diagnosis not present

## 2017-04-27 DIAGNOSIS — I10 Essential (primary) hypertension: Secondary | ICD-10-CM | POA: Diagnosis not present

## 2017-04-27 DIAGNOSIS — C787 Secondary malignant neoplasm of liver and intrahepatic bile duct: Secondary | ICD-10-CM | POA: Diagnosis not present

## 2017-04-27 DIAGNOSIS — E860 Dehydration: Secondary | ICD-10-CM | POA: Diagnosis not present

## 2017-04-27 LAB — BASIC METABOLIC PANEL
ANION GAP: 8 (ref 5–15)
BUN: 10 mg/dL (ref 6–20)
CALCIUM: 8.2 mg/dL — AB (ref 8.9–10.3)
CO2: 25 mmol/L (ref 22–32)
CREATININE: 1.09 mg/dL — AB (ref 0.44–1.00)
Chloride: 105 mmol/L (ref 101–111)
GFR calc non Af Amer: 58 mL/min — ABNORMAL LOW (ref >60)
Glucose, Bld: 92 mg/dL (ref 65–99)
Potassium: 3.4 mmol/L — ABNORMAL LOW (ref 3.5–5.1)
SODIUM: 138 mmol/L (ref 135–145)

## 2017-04-27 LAB — GLUCOSE, CAPILLARY: GLUCOSE-CAPILLARY: 82 mg/dL (ref 65–99)

## 2017-04-27 MED ORDER — ALPRAZOLAM 0.25 MG PO TABS: 0.2500 mg | ORAL_TABLET | Freq: Two times a day (BID) | ORAL | 0 refills | Status: AC | PRN

## 2017-04-27 MED ORDER — HYDRALAZINE HCL 20 MG/ML IJ SOLN: 10.0000 mg | Freq: Four times a day (QID) | INTRAMUSCULAR | Status: DC | PRN

## 2017-04-27 MED ORDER — LEVOFLOXACIN 500 MG PO TABS: 500.0000 mg | ORAL_TABLET | Freq: Every day | ORAL | 0 refills | Status: AC

## 2017-04-27 MED ORDER — HYDROXYZINE HCL 25 MG PO TABS: 25.0000 mg | ORAL_TABLET | Freq: Three times a day (TID) | ORAL | 0 refills | Status: AC | PRN

## 2017-04-27 MED ORDER — HEPARIN SOD (PORK) LOCK FLUSH 100 UNIT/ML IV SOLN
500.0000 [IU] | INTRAVENOUS | Status: AC | PRN
  Administered 2017-04-27: 500 [IU]

## 2017-04-27 MED ORDER — LOSARTAN POTASSIUM 100 MG PO TABS: 100.0000 mg | ORAL_TABLET | Freq: Every day | ORAL | 0 refills | Status: AC

## 2017-04-27 MED ORDER — AMLODIPINE BESYLATE 5 MG PO TABS
5.0000 mg | ORAL_TABLET | Freq: Every day | ORAL | Status: DC
  Administered 2017-04-27: 5 mg via ORAL
  Filled 2017-04-27: qty 1

## 2017-04-27 MED ORDER — LOSARTAN POTASSIUM 50 MG PO TABS
100.0000 mg | ORAL_TABLET | Freq: Every day | ORAL | Status: DC
  Administered 2017-04-27: 100 mg via ORAL
  Filled 2017-04-27: qty 2

## 2017-04-27 MED ORDER — AMLODIPINE BESYLATE 5 MG PO TABS: 2.5000 mg | ORAL_TABLET | Freq: Every day | ORAL | 0 refills | Status: AC

## 2017-04-27 MED ORDER — HYDROCODONE-ACETAMINOPHEN 5-325 MG PO TABS: 1.0000 | ORAL_TABLET | ORAL | 0 refills | Status: AC | PRN

## 2017-04-27 MED FILL — HYDROCODON-APAP 5-325: 5-325 | 3 days supply | Qty: 30 | Fill #0

## 2017-04-27 MED FILL — hydrOXYzine HCL 25 MG TABS: 25 | 10 days supply | Qty: 30 | Fill #0

## 2017-04-27 MED FILL — LOSARTAN POTASSIUM 100 MG T: 100 | 30 days supply | Qty: 30 | Fill #0

## 2017-04-27 MED FILL — ALPRAZolam 0.25 MG TABS: 0.25 | 15 days supply | Qty: 30 | Fill #0

## 2017-04-27 MED FILL — AMLODIPINE BESYLATE 5 MG TA: 5 | 60 days supply | Qty: 30 | Fill #0

## 2017-04-27 MED FILL — levoFLOXacin 500 MG TABS: 500 | 3 days supply | Qty: 3 | Fill #0

## 2017-04-27 NOTE — Discharge Summary (Signed)
Physician Discharge Summary  Kayla Kelley  HEN:[id number]  DOB: [birth year]  DOA: 04/25/2017 PCP: Eulas Post, MD  Admit date: 04/25/2017 Discharge date: 04/27/2017  Admitted From: Home  Disposition:  Home   Recommendations for Outpatient Follow-up:  1. Follow up with PCP in 1 week  2. Please obtain BMP/CBC in one week 3. Please follow up on the following pending results:  Discharge Condition: Stable   CODE STATUS: Full Code Diet recommendation: Heart Healthy   Brief/Interim Summary: For full details see H&P/Progress note, but in brief, Kayla Kelley is a  52 year old female with medical history significant for ovarian cancer with metastasis status post bilateral salpingostomy, hypothyroidism, GERD, hypertension who presented to the emergency department complaining of abdominal discomfort and weakness.  Patient presented to the ED 1 day prior to admission with similar complaints she was treated with IV fluid she felt better and she was discharged home after.  Going home she started experiencing more fatigue and constant epigastric pain with nausea and vomiting and ended up calling EMS and brought her to the hospital.  In the ED lab evaluation showed elevated LFTs, she was clinically dehydrated and CT of the abdomen and pelvis showed liver metastasis, ascites, possible peritoneal implants with hydronephrosis.  CTA was negative for pulmonary embolism but showed tree-in-bud type appearance concerning for infectious versus inflammatory disease.  Patient was admitted with working diagnosis of possible pneumonia and abdominal pain related to cancer  Subjective: Patient seen and examined, abdominal pain is well controlled with Percocet. She is tolerating diet well.  No acute events overnight.  Afebrile.  Discharge Diagnoses/Hospital Course:  Ovarian cancer with metastasis, with progressive liver metastases, peritoneal implant and ascites, causing abdominal pain. Pain well controlled  with Percocet, will discharge with prescription, will be long-term medication as pain is cancer related. Transaminitis related to liver metastasis.  Patient will follow with her oncologist in the end, she has an appointment for this coming week.  Left hydronephrosis and proximal hydroureter Case discussed with urology, felt to be secondary to dehydration, as there is no evidence of obstruction in CT scan, no further workup needed.  She was treated with IV fluid and creatinine remained stable.  Follow-up as an outpatient  Atypical pneumonia CT chest showed multifocal tree-in-bud/peribronchial vascular nodular opacity ?  Inflammatory changes Patient receiving Levaquin will treat for total 5 days Continue supportive care  Hypothyroidism Continue Synthroid  Essential hypertension Patient was taking amlodipine and losartan as her blood pressure was stable at home, and she was interested that she can come off her blood pressure medication. BP was elevated during hospital stay was slightly elevated therefore amlodipine and losartan were resumed. Patient advised to follow-up with PCP  Anemia of chronic disease related to malignancy No overt bleeding  All other chronic medical condition were stable during the hospitalization.  On the day of the discharge the patient's vitals were stable, and no other acute medical condition were reported by patient. the patient was felt safe to be discharge to home  Discharge Instructions  You were cared for by a hospitalist during your hospital stay. If you have any questions about your discharge medications or the care you received while you were in the hospital after you are discharged, you can call the unit and asked to speak with the hospitalist on call if the hospitalist that took care of you is not available. Once you are discharged, your primary care physician will handle any further medical issues. Please note that NO  REFILLS for any discharge  medications will be authorized once you are discharged, as it is imperative that you return to your primary care physician (or establish a relationship with a primary care physician if you do not have one) for your aftercare needs so that they can reassess your need for medications and monitor your lab values.  Discharge Instructions    Call MD for:  difficulty breathing, headache or visual disturbances   Complete by:  As directed    Call MD for:  extreme fatigue   Complete by:  As directed    Call MD for:  hives   Complete by:  As directed    Call MD for:  persistant dizziness or light-headedness   Complete by:  As directed    Call MD for:  persistant nausea and vomiting   Complete by:  As directed    Call MD for:  redness, tenderness, or signs of infection (pain, swelling, redness, odor or green/yellow discharge around incision site)   Complete by:  As directed    Call MD for:  severe uncontrolled pain   Complete by:  As directed    Call MD for:  temperature >100.4   Complete by:  As directed    Diet - low sodium heart healthy   Complete by:  As directed    Increase activity slowly   Complete by:  As directed      Allergies as of 04/27/2017      Reactions   Ampicillin Hives   Has patient had a PCN reaction causing immediate rash, facial/tongue/throat swelling, SOB or lightheadedness with hypotension: Yes Has patient had a PCN reaction causing severe rash involving mucus membranes or skin necrosis: No Has patient had a PCN reaction that required hospitalization: No Has patient had a PCN reaction occurring within the last 10 years: No If all of the above answers are "NO", then may proceed with Cephalosporin use.      Medication List    TAKE these medications   ALPRAZolam 0.25 MG tablet Commonly known as:  XANAX Take 1 tablet (0.25 mg total) by mouth 2 (two) times daily as needed for anxiety.   amLODipine 5 MG tablet Commonly known as:  NORVASC Take 0.5 tablets (2.5 mg  total) by mouth daily. What changed:  how much to take   docusate sodium 100 MG capsule Commonly known as:  COLACE Take 100 mg by mouth daily.   etoposide 50 MG capsule Commonly known as:  VEPESID Take 50 mg/m2/day by mouth daily. Pt taking x 3 weeks on and then take a week off   ferrous sulfate 325 (65 FE) MG tablet Take 325 mg by mouth daily.   HYDROcodone-acetaminophen 5-325 MG tablet Commonly known as:  NORCO/VICODIN Take 1-2 tablets by mouth every 4 (four) hours as needed for moderate pain.   hydrOXYzine 25 MG tablet Commonly known as:  ATARAX/VISTARIL Take 1 tablet (25 mg total) by mouth 3 (three) times daily as needed for itching.   levofloxacin 500 MG tablet Commonly known as:  LEVAQUIN Take 1 tablet (500 mg total) by mouth daily for 3 days. Start taking on:  04/28/2017   levothyroxine 50 MCG tablet Commonly known as:  SYNTHROID, LEVOTHROID Take 1 tablet (50 mcg total) by mouth daily.   losartan 100 MG tablet Commonly known as:  COZAAR Take 1 tablet (100 mg total) by mouth daily.   magnesium oxide 400 MG tablet Commonly known as:  MAG-OX Take 400 mg by mouth daily.  polyethylene glycol packet Commonly known as:  MIRALAX / GLYCOLAX Take 17 g by mouth daily as needed for mild constipation or moderate constipation.   prochlorperazine 10 MG tablet Commonly known as:  COMPAZINE Take 10 mg by mouth every 8 (eight) hours as needed for nausea or vomiting.   vitamin A 8000 UNIT capsule Take 8,000 Units by mouth daily.   vitamin B-12 1000 MCG tablet Commonly known as:  CYANOCOBALAMIN Take 1,000 mcg by mouth daily.   Vitamin D3 1000 units Caps Take 1,000 Units by mouth daily.   vitamin E 1000 UNIT capsule Take 1,000 Units by mouth daily.      Follow-up Information    Burchette, Alinda Sierras, MD. Schedule an appointment as soon as possible for a visit in 1 week(s).   Specialty:  Family Medicine Why:  Hospital follow-up Contact information: [address] [place] [address] [phone]          Allergies  Allergen Reactions  . Ampicillin Hives    Has patient had a PCN reaction causing immediate rash, facial/tongue/throat swelling, SOB or lightheadedness with hypotension: Yes Has patient had a PCN reaction causing severe rash involving mucus membranes or skin necrosis: No Has patient had a PCN reaction that required hospitalization: No Has patient had a PCN reaction occurring within the last 10 years: No If all of the above answers are "NO", then may proceed with Cephalosporin use.     Consultations:  None   Procedures/Studies: Dg Chest 2 View  Result Date: 04/24/2017 CLINICAL DATA:  Ovarian carcinoma with weakness EXAM: CHEST - 2 VIEW COMPARISON:  PET-CT July 15, 2016; chest radiograph March 31, 2008 FINDINGS: Port-A-Cath tip is at the cavoatrial junction. No pneumothorax. There is a small right pleural effusion. There is no edema or consolidation. The heart size and pulmonary vascularity are normal. No evident adenopathy. No bone lesions. IMPRESSION: Port-A-Cath as described without pneumothorax. Small right pleural effusion. No edema or consolidation. Heart size normal. No adenopathy evident. Electronically Signed   By: Lowella Grip III M.D.   On: 04/24/2017 13:45   Ct Angio Chest Pe W And/or Wo Contrast  Result Date: 04/25/2017 CLINICAL DATA:  Abdominal distension. Abdominal pain. Diffuse pressure. EXAM: CT ANGIOGRAPHY CHEST CT ABDOMEN AND PELVIS WITH CONTRAST TECHNIQUE: Multidetector CT imaging of the chest was performed using the standard protocol during bolus administration of intravenous contrast. Multiplanar CT image reconstructions and MIPs were obtained to evaluate the vascular anatomy. Multidetector CT imaging of the abdomen and pelvis was performed using the standard protocol during bolus administration of intravenous contrast. CONTRAST:  146mL ISOVUE-370 IOPAMIDOL (ISOVUE-370) INJECTION 76% COMPARISON:  Chest  radiographs yesterday.  PET-CT 07/15/2016 FINDINGS: CTA CHEST FINDINGS Cardiovascular: There are no filling defects within the pulmonary arteries to suggest pulmonary embolus. Thoracic aorta is normal in caliber without dissection. Heart is normal in size. Right chest port with tip at the distal SVC. No pericardial effusion. Mediastinum/Nodes: Prominent right hilar node measures 9 mm. There multiple small mediastinal nodes, for example lower paratracheal node measures 10 mm short axis. The esophagus is decompressed. No thyroid nodule. No axillary adenopathy. Lungs/Pleura: Small-moderate bilateral pleural effusions with associated atelectasis. Mild septal thickening in the upper lobes. There are multifocal tree in bud opacities with peribronchovascular nodularity throughout all lobes of both lungs. Central bronchial thickening with scattered areas of mucoid putting/impaction. Clustered subpleural nodularity in the left upper lobe image 72 series 3. Trachea and mainstem bronchi are patent. Musculoskeletal: There are no acute or  suspicious osseous abnormalities. No focal osseous lesion. Review of the MIP images confirms the above findings. CT ABDOMEN and PELVIS FINDINGS Hepatobiliary: Large cyst in the right hepatic dome. 14 mm low-density subcapsular lesion in segment 6, image 32 series 11. Probable subcapsular lesion more superiorly image 24. The hypermetabolic peritoneal implants about the liver on prior PET are less well-defined. Gallbladder sludge/stones without abnormal gallbladder distention or pericholecystic inflammation. Pancreas: No ductal dilatation or inflammation. Spleen: Normal in size without focal abnormality. Adrenals/Urinary Tract: No adrenal nodule. There is left hydronephrosis with dilatation of the proximal ureter. No obstructing stone or cause of obstruction, ureter becomes decompressed at the pelvic brim. There is absent excretion from the left kidney on delayed phase imaging and delayed  enhancement. No right hydronephrosis. No right hydroureter. Urinary bladder is partially distended. No bladder wall thickening. Stomach/Bowel: Bowel evaluation is limited in the absence of enteric contrast and presence of intra-abdominal ascites. No bowel obstruction. No bowel wall thickening. Stomach is nondistended. The appendix is not visualized. Vascular/Lymphatic: Prior pelvic and retroperitoneal nodal dissection. Peritoneal implants as described below. No definite enlarged abdominal or pelvic lymph nodes. Reproductive: Post hysterectomy and bilateral salpingectomy. Other: Development of small to moderate ascites from prior PET. Peritoneal and omental nodularity of the upper abdomen, slightly progressed from prior exam. There are new perigastric deposits. Peritoneal implants in the right pericolic gutter better demonstrated on prior PET rounded cystic density adjacent to the right external iliac vessels is unchanged. Small fluid collection/cystic deposit in the pelvic cul-de-sac. Generalized mesenteric stranding secondary to ascites. Small fat containing supraumbilical ventral abdominal wall hernia. Musculoskeletal: There are no acute or suspicious osseous abnormalities. No visualized focal osseous lesion. Review of the MIP images confirms the above findings. IMPRESSION: CTA Chest: 1. No pulmonary embolus. 2. Multifocal tree in bud and peribronchovascular nodular opacities throughout all lobes of both lungs, likely infectious/inflammatory. Neoplastic etiology is felt less likely but not entirely excluded. 3. Small to moderate bilateral pleural effusions. Smooth septal thickening suggests pulmonary edema. CT Abdomen/pelvis: 1. Progression of metastatic disease since comparison PET 07/15/2016. Two new parenchymal liver lesions. Increased peritoneal implants in the upper abdomen. Development of intra-abdominal ascites, small to moderate. 2. Left hydronephrosis and proximal hydroureter without cause obstruction. 3.  Sludge or stones in the gallbladder without gallbladder inflammation. Electronically Signed   By: Jeb Levering M.D.   On: 04/25/2017 05:47   Ct Abdomen Pelvis W Contrast  Result Date: 04/25/2017 CLINICAL DATA:  Abdominal distension. Abdominal pain. Diffuse pressure. EXAM: CT ANGIOGRAPHY CHEST CT ABDOMEN AND PELVIS WITH CONTRAST TECHNIQUE: Multidetector CT imaging of the chest was performed using the standard protocol during bolus administration of intravenous contrast. Multiplanar CT image reconstructions and MIPs were obtained to evaluate the vascular anatomy. Multidetector CT imaging of the abdomen and pelvis was performed using the standard protocol during bolus administration of intravenous contrast. CONTRAST:  14mL ISOVUE-370 IOPAMIDOL (ISOVUE-370) INJECTION 76% COMPARISON:  Chest radiographs yesterday.  PET-CT 07/15/2016 FINDINGS: CTA CHEST FINDINGS Cardiovascular: There are no filling defects within the pulmonary arteries to suggest pulmonary embolus. Thoracic aorta is normal in caliber without dissection. Heart is normal in size. Right chest port with tip at the distal SVC. No pericardial effusion. Mediastinum/Nodes: Prominent right hilar node measures 9 mm. There multiple small mediastinal nodes, for example lower paratracheal node measures 10 mm short axis. The esophagus is decompressed. No thyroid nodule. No axillary adenopathy. Lungs/Pleura: Small-moderate bilateral pleural effusions with associated atelectasis. Mild septal thickening in the upper lobes. There are multifocal  tree in bud opacities with peribronchovascular nodularity throughout all lobes of both lungs. Central bronchial thickening with scattered areas of mucoid putting/impaction. Clustered subpleural nodularity in the left upper lobe image 72 series 3. Trachea and mainstem bronchi are patent. Musculoskeletal: There are no acute or suspicious osseous abnormalities. No focal osseous lesion. Review of the MIP images confirms the  above findings. CT ABDOMEN and PELVIS FINDINGS Hepatobiliary: Large cyst in the right hepatic dome. 14 mm low-density subcapsular lesion in segment 6, image 32 series 11. Probable subcapsular lesion more superiorly image 24. The hypermetabolic peritoneal implants about the liver on prior PET are less well-defined. Gallbladder sludge/stones without abnormal gallbladder distention or pericholecystic inflammation. Pancreas: No ductal dilatation or inflammation. Spleen: Normal in size without focal abnormality. Adrenals/Urinary Tract: No adrenal nodule. There is left hydronephrosis with dilatation of the proximal ureter. No obstructing stone or cause of obstruction, ureter becomes decompressed at the pelvic brim. There is absent excretion from the left kidney on delayed phase imaging and delayed enhancement. No right hydronephrosis. No right hydroureter. Urinary bladder is partially distended. No bladder wall thickening. Stomach/Bowel: Bowel evaluation is limited in the absence of enteric contrast and presence of intra-abdominal ascites. No bowel obstruction. No bowel wall thickening. Stomach is nondistended. The appendix is not visualized. Vascular/Lymphatic: Prior pelvic and retroperitoneal nodal dissection. Peritoneal implants as described below. No definite enlarged abdominal or pelvic lymph nodes. Reproductive: Post hysterectomy and bilateral salpingectomy. Other: Development of small to moderate ascites from prior PET. Peritoneal and omental nodularity of the upper abdomen, slightly progressed from prior exam. There are new perigastric deposits. Peritoneal implants in the right pericolic gutter better demonstrated on prior PET rounded cystic density adjacent to the right external iliac vessels is unchanged. Small fluid collection/cystic deposit in the pelvic cul-de-sac. Generalized mesenteric stranding secondary to ascites. Small fat containing supraumbilical ventral abdominal wall hernia. Musculoskeletal: There  are no acute or suspicious osseous abnormalities. No visualized focal osseous lesion. Review of the MIP images confirms the above findings. IMPRESSION: CTA Chest: 1. No pulmonary embolus. 2. Multifocal tree in bud and peribronchovascular nodular opacities throughout all lobes of both lungs, likely infectious/inflammatory. Neoplastic etiology is felt less likely but not entirely excluded. 3. Small to moderate bilateral pleural effusions. Smooth septal thickening suggests pulmonary edema. CT Abdomen/pelvis: 1. Progression of metastatic disease since comparison PET 07/15/2016. Two new parenchymal liver lesions. Increased peritoneal implants in the upper abdomen. Development of intra-abdominal ascites, small to moderate. 2. Left hydronephrosis and proximal hydroureter without cause obstruction. 3. Sludge or stones in the gallbladder without gallbladder inflammation. Electronically Signed   By: Jeb Levering M.D.   On: 04/25/2017 05:47    Discharge Exam: Vitals:   04/26/17 2050 04/27/17 0455  BP: (!) 151/101 (!) 160/100  Pulse: (!) 116 (!) 106  Resp: 18 18  Temp: 99 F (37.2 C) 98.3 F (36.8 C)  SpO2: 98% 97%   Vitals:   04/26/17 1400 04/26/17 2050 04/27/17 0455 04/27/17 0457  BP: (!) 134/96 (!) 151/101 (!) 160/100   Pulse: (!) 116 (!) 116 (!) 106   Resp: 18 18 18    Temp: 98.6 F (37 C) 99 F (37.2 C) 98.3 F (36.8 C)   TempSrc: Oral Oral Oral   SpO2: 98% 98% 97%   Weight:    84.1 kg (185 lb 6.5 oz)  Height:       General: NAD Cardiovascular: RRR, S1/S2 +, no rubs, no gallops Respiratory: CTA bilaterally, no wheezing, no rhonchi Abdominal: Soft, NT, ND,  bowel sounds + Extremities: no edema  The results of significant diagnostics from this hospitalization (including imaging, microbiology, ancillary and laboratory) are listed below for reference.     Microbiology: No results found for this or any previous visit (from the past 240 hour(s)).   Labs: BNP (last 3 results) Recent  Labs    04/26/17 0703  BNP 16.1   Basic Metabolic Panel: Recent Labs  Lab 04/24/17 1413 04/25/17 0842 04/26/17 0703 04/27/17 0338  NA 135  --  139 138  K 3.8  --  3.7 3.4*  CL 96*  --  103 105  CO2 28  --  28 25  GLUCOSE 113*  --  92 92  BUN 13  --  11 10  CREATININE 1.23* 1.10* 1.18* 1.09*  CALCIUM 9.1  --  8.4* 8.2*   Liver Function Tests: Recent Labs  Lab 04/24/17 1413 04/26/17 0703  AST 146* 115*  ALT 168* 134*  ALKPHOS 583* 512*  BILITOT 1.5* 1.3*  PROT 6.2* 5.3*  ALBUMIN 2.7* 2.3*   No results for input(s): LIPASE, AMYLASE in the last 168 hours. No results for input(s): AMMONIA in the last 168 hours. CBC: Recent Labs  Lab 04/24/17 1413 04/25/17 0842 04/26/17 0703  WBC 6.4 5.5 4.8  NEUTROABS 5.1  --   --   HGB 8.7* 8.1* 7.9*  HCT 27.7* 26.0* 25.9*  MCV 97.9 99.6 101.2*  PLT 441* 437* 421*   Cardiac Enzymes: No results for input(s): CKTOTAL, CKMB, CKMBINDEX, TROPONINI in the last 168 hours. BNP: Invalid input(s): POCBNP CBG: Recent Labs  Lab 04/26/17 0807 04/27/17 0723  GLUCAP 128* 82   D-Dimer No results for input(s): DDIMER in the last 72 hours. Hgb A1c No results for input(s): HGBA1C in the last 72 hours. Lipid Profile No results for input(s): CHOL, HDL, LDLCALC, TRIG, CHOLHDL, LDLDIRECT in the last 72 hours. Thyroid function studies No results for input(s): TSH, T4TOTAL, T3FREE, THYROIDAB in the last 72 hours.  Invalid input(s): FREET3 Anemia work up No results for input(s): VITAMINB12, FOLATE, FERRITIN, TIBC, IRON, RETICCTPCT in the last 72 hours. Urinalysis    Component Value Date/Time   COLORURINE YELLOW 04/24/2017 1547   APPEARANCEUR CLEAR 04/24/2017 1547   LABSPEC 1.004 (L) 04/24/2017 1547   PHURINE 7.0 04/24/2017 1547   GLUCOSEU NEGATIVE 04/24/2017 1547   HGBUR SMALL (A) 04/24/2017 1547   HGBUR negative 10/23/2008 0955   BILIRUBINUR NEGATIVE 04/24/2017 1547   BILIRUBINUR n 07/12/2013 1022   KETONESUR NEGATIVE 04/24/2017  1547   PROTEINUR NEGATIVE 04/24/2017 1547   UROBILINOGEN 0.2 07/12/2013 1022   UROBILINOGEN 0.2 10/23/2008 0955   NITRITE NEGATIVE 04/24/2017 1547   LEUKOCYTESUR NEGATIVE 04/24/2017 1547   Sepsis Labs Invalid input(s): PROCALCITONIN,  WBC,  LACTICIDVEN Microbiology No results found for this or any previous visit (from the past 240 hour(s)).   Time coordinating discharge: 32 minutes  SIGNED:  Chipper Oman, MD  [facility] 04/27/2017, 11:07 AM  Pager please text page via  [email or web address]  Note - This record has been created using [employer]. Chart creation errors have been sought, but may not always have been located. Such creation errors do not reflect on the standard of medical care.

## 2017-04-27 NOTE — Telephone Encounter (Signed)
Requesting script for Xanax and send to [facility] outpatient pharmacy.

## 2017-04-27 NOTE — Telephone Encounter (Signed)
I spoke with pt, she has went to ER twice, being discharged now. She will be following up with [facility].

## 2017-05-04 NOTE — Unmapped (Signed)
[facility] Refill Coordination Note  Medication: Etoposide 50mg     Unable to reach patient to schedule shipment for medication being filled at [facility]. Left voicemail on phone.  As this is the 3rd unsuccessful attempt to reach the patient, no additional phone call attempts will be made at this time.      Phone numbers attempted: [phone]  Last scheduled delivery: 03/25/17    Please call the [facility] at [phone] (option [phone]) should you have any further questions.      Thanks,  [facility] Specialty Team

## 2017-07-04 DEATH — deceased

## 2017-12-30 ENCOUNTER — Telehealth: Payer: Self-pay | Admitting: *Deleted

## 2017-12-30 NOTE — Telephone Encounter (Deleted)
Patient case reviewed in tumor board today: Peritoneal nodes not clearly related to colon cancer. Biopsy is recommended as this may effect treatment decision. Recommends CT biopsy of peritoneal nodule if she agrees. Awaiting return call from patient.

## 2018-05-21 IMAGING — US US ABDOMEN COMPLETE
1 series · 13 of 25 positions shown · non-contrast
Comparison: None.

CLINICAL DATA: Fullness in the right and left mid abdomen

EXAM:
ABDOMEN ULTRASOUND COMPLETE

[Series 1: us abdomen complete · 0.43mm/px · 13 of 121 slices shown]
[im 1/121]
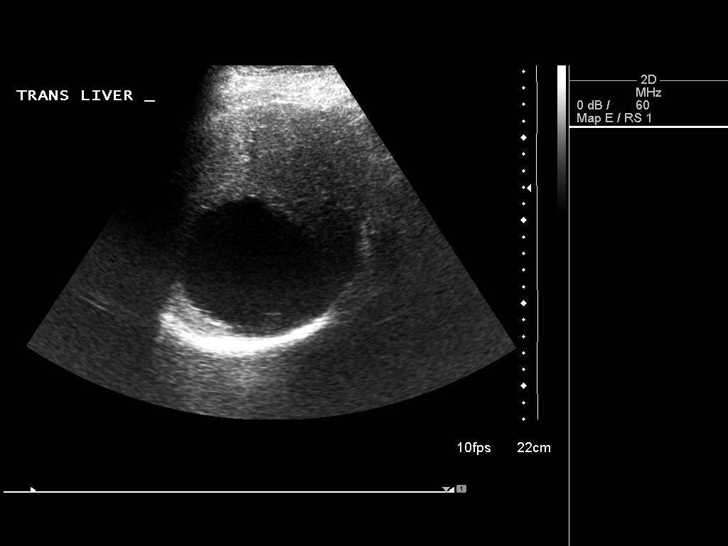
[im 11/121]
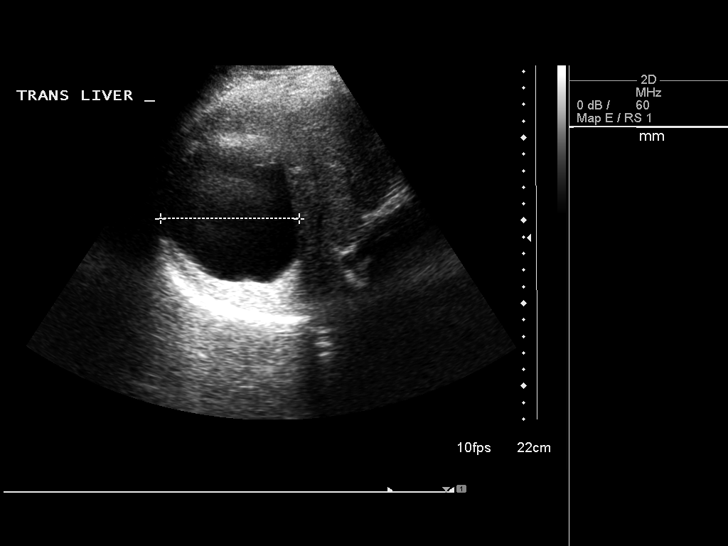
[im 21/121]
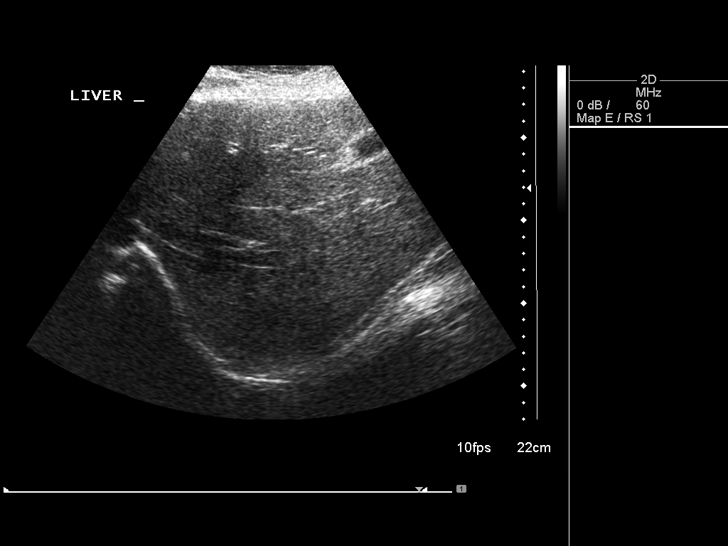
[im 31/121]
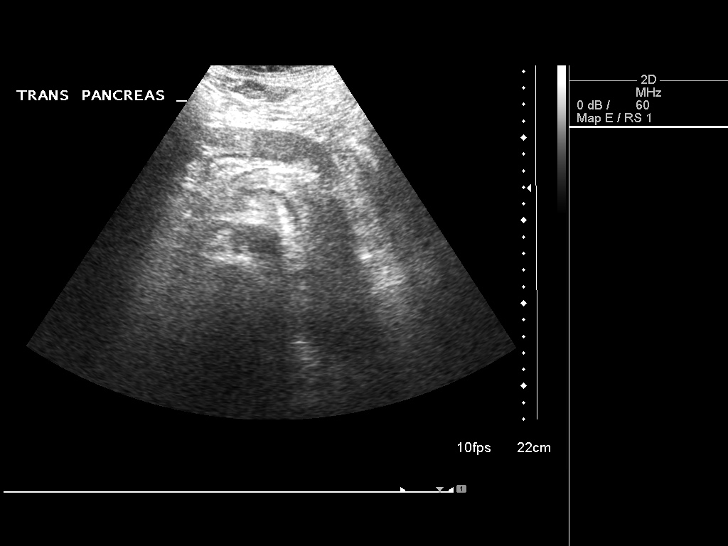
[im 41/121]
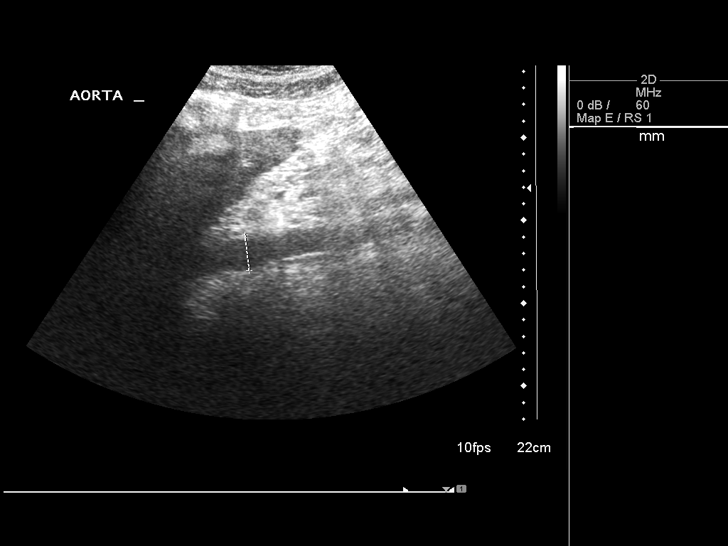
[im 51/121]
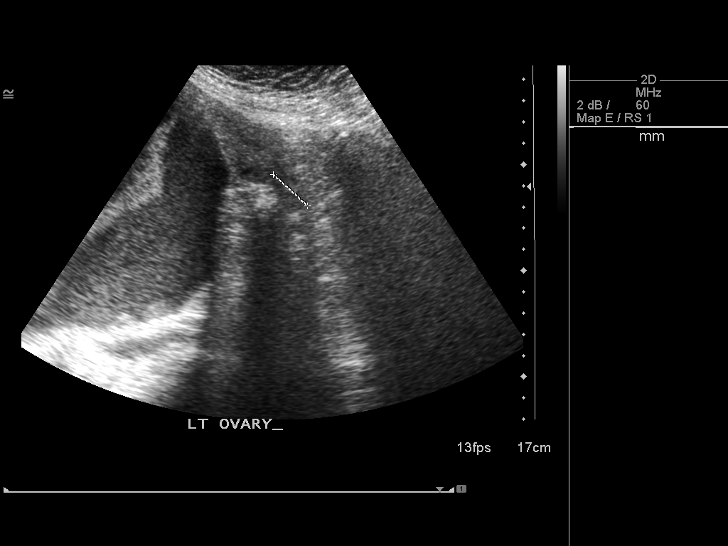
[im 61/121]
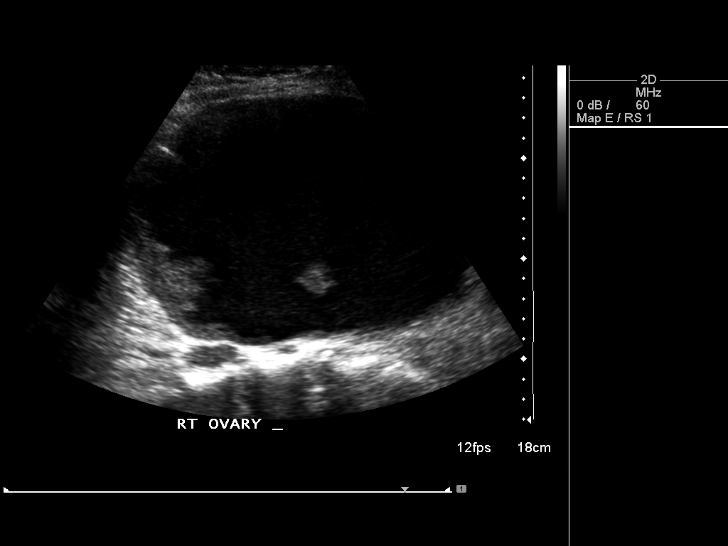
[im 71/121]
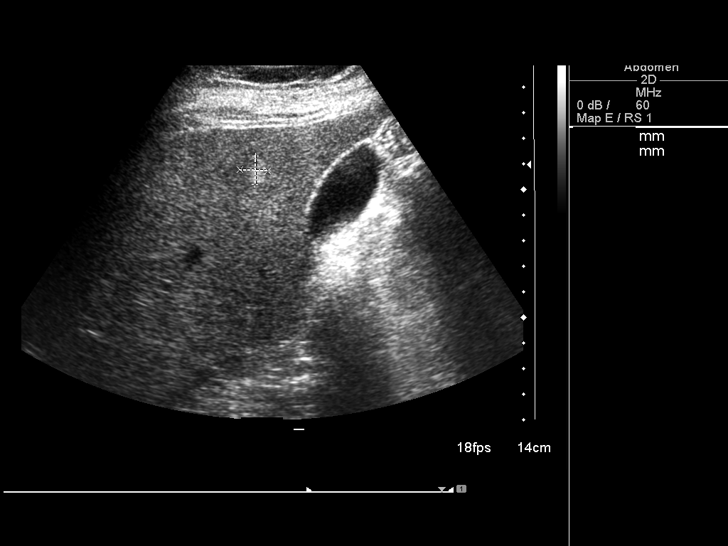
[im 81/121]
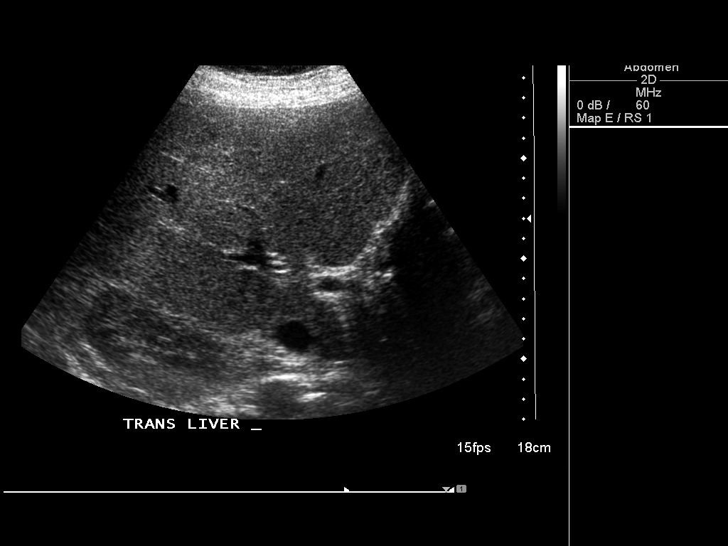
[im 91/121]
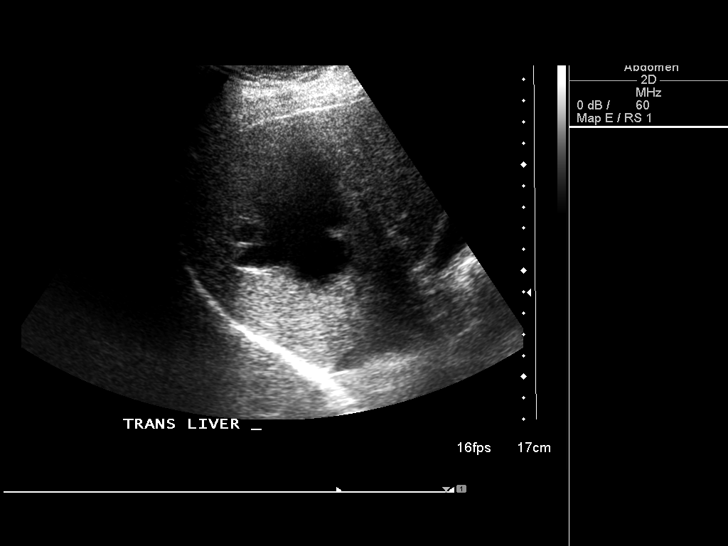
[im 101/121]
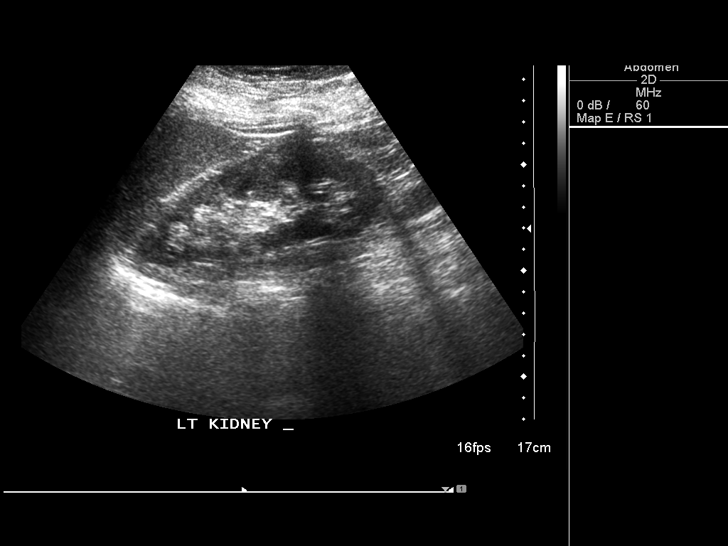
[im 111/121]
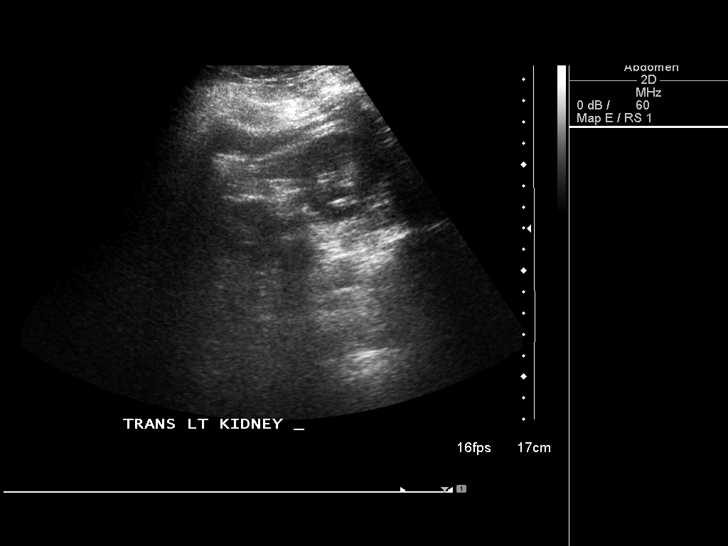
[im 121/121]
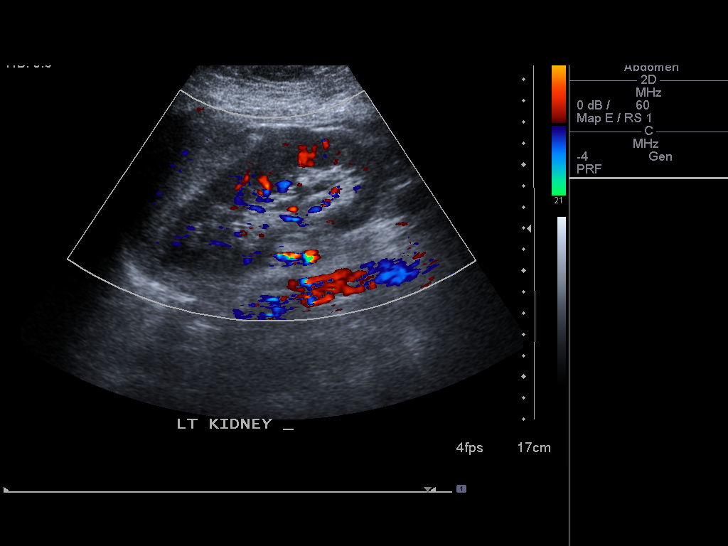

[13 of 25 positions shown; findings below may reference images not displayed]

FINDINGS: Gallbladder: The gallbladder is visualized and no gallstones are
noted. There is no pain over the gallbladder with compression.

Common bile duct: Diameter: The common bile duct is normal measuring
3.9 mm in diameter.

Liver: The liver has a normal echogenic pattern. There is a single
focus of echogenicity anteriorly most consistent with a small
hemangioma of 1.1 x 1.1 x 1.5 cm. Within the right lobe posteriorly
there is a complex primarily cystic structure of 10.0 x 7.4 x 8.4 cm
most consistent with a complex cyst.

IVC: No abnormality visualized.

Pancreas: The pancreas is partially obscured in the region of the
head and tail.

Spleen: The spleen is normal measuring 6.9 cm.

Right Kidney: Length: 11.4 cm..  No hydronephrosis is seen.

Left Kidney: Length: 12.1 cm..  No hydronephrosis is seen.

Abdominal aorta: Much of the abdominal aorta is obscured by a
complex mass.

Other findings: There is a complex mass within the pelvis extending
into the abdomen of 24 x 14 x 18 cm with cystic and solid
components. This lesion is worrisome for malignancy, possibly
ovarian carcinoma, and CT of the abdomen pelvis is recommended to
assess further.
IMPRESSION: 1. Large complex mass within the pelvis extending into the abdomen
of 24 x 14 x 18 cm. This is worrisome for malignancy possibly
ovarian, and CT of the abdomen pelvis is recommended.
2. Complex cyst in the right lobe of liver of 10 cm in diameter.
3. No gallstones.

## 2018-12-18 IMAGING — CT NM PET TUM IMG INITIAL (PI) SKULL BASE T - THIGH
9 series · 25 of 25 positions shown · non-contrast
Comparison: CT exams from 02/06/2016 and 08/22/2015

CLINICAL DATA: Initial treatment strategy for recurrent ovarian
cancer.

EXAM:
NUCLEAR MEDICINE PET SKULL BASE TO THIGH
TECHNIQUE: 9.2 mCi F-18 FDG was injected intravenously. Full-ring PET imaging
was performed from the skull base to thigh after the radiotracer. CT
data was obtained and used for attenuation correction and anatomic
localization.
FASTING BLOOD GLUCOSE:  Value: 108 mg/dl

[Series 3: pet sk_thigh ac · axial · 5.0mm · 4.07mm/px · z∈[-816,+48]mm · 4 of 217 slices shown]
[im 1/217]
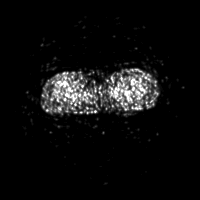
[im 73/217]
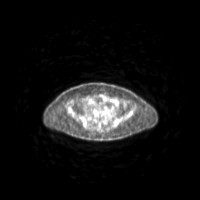
[im 145/217]
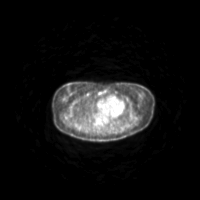
[im 217/217]
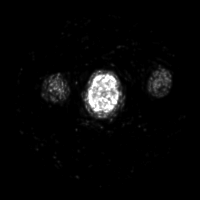

[Series 4: ct sk_thigh 5.0 hd_fov · axial · 5.0mm · 1.14mm/px · z∈[-816,+48]mm · 4 of 217 slices shown]
[im 1/217]
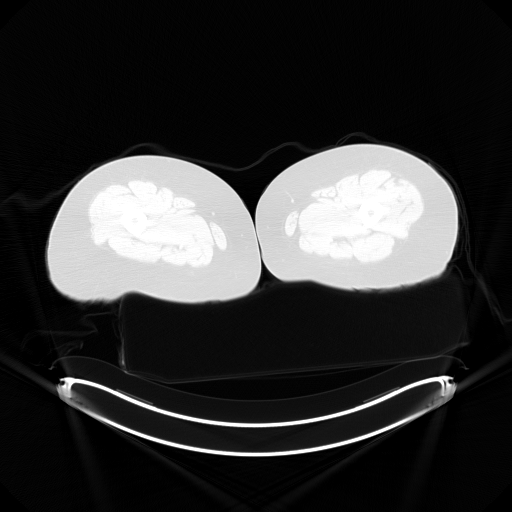
[im 73/217]
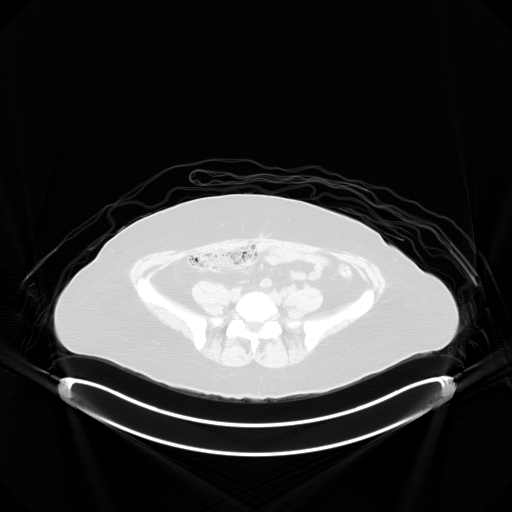
[im 145/217]
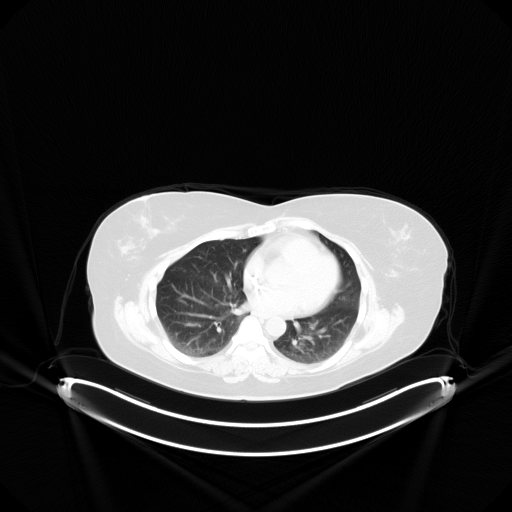
[im 217/217  brain]
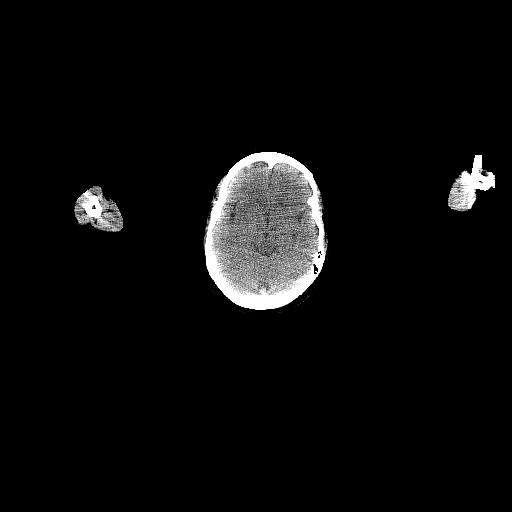

[Series 7: pet sk_thigh nac · axial · 5.0mm · 4.07mm/px · z∈[-816,+48]mm · 5 of 217 slices shown]
[im 1/217]
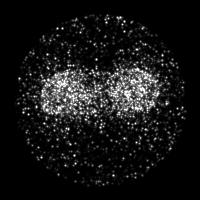
[im 55/217]
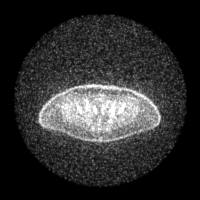
[im 109/217]
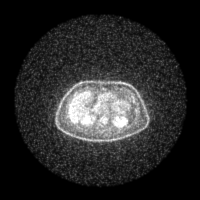
[im 163/217]
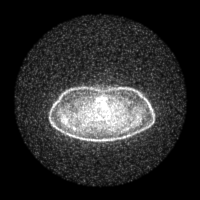
[im 217/217]
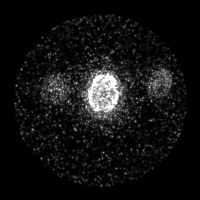

[Series 8: ct sk_thigh 5.0 b70f (id)_bone · axial · 5.0mm · 0.68mm/px · z∈[-346,-78]mm · 2 of 68 slices shown]
[im 1/68  bone]
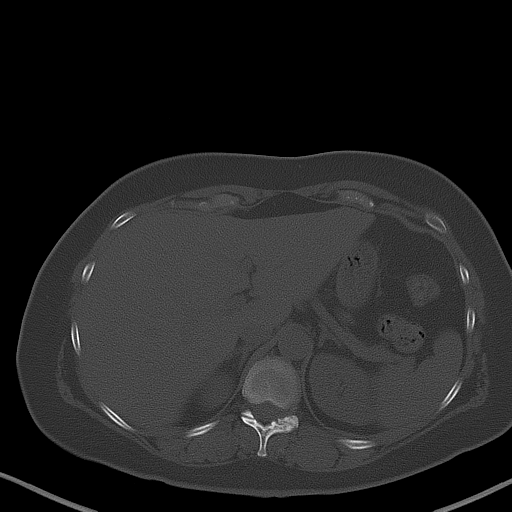
[im 68/68  bone]
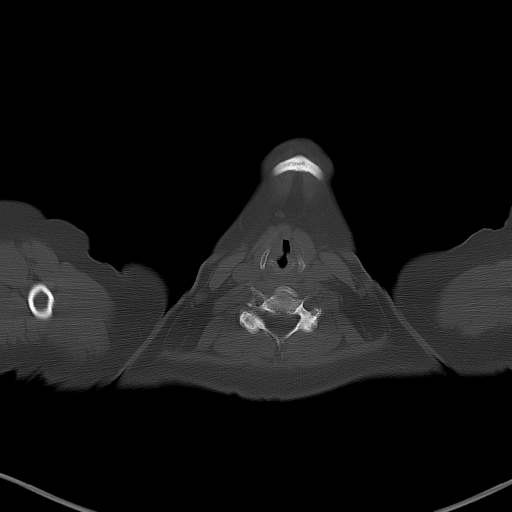

[Series 603: mip collection · coronal · 1.79mm/px · 1 of 32 slices shown]
[im 1/32]
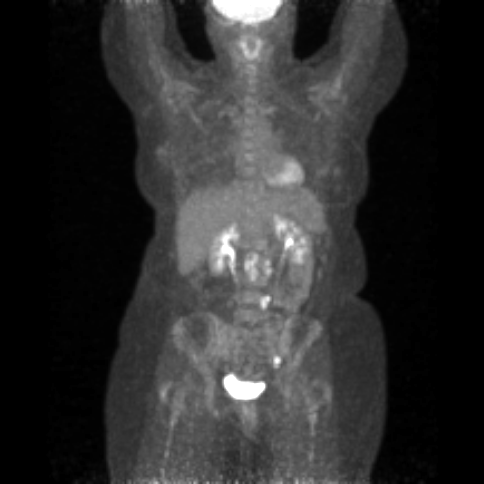

[Series 604: range-ct sk_thigh 5.0 hd_fov-cor-<alpha range> · 2 of 94 slices shown]
[im 1/94]
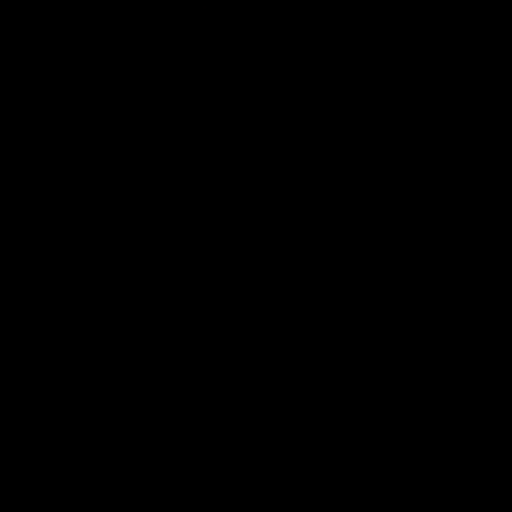
[im 94/94]
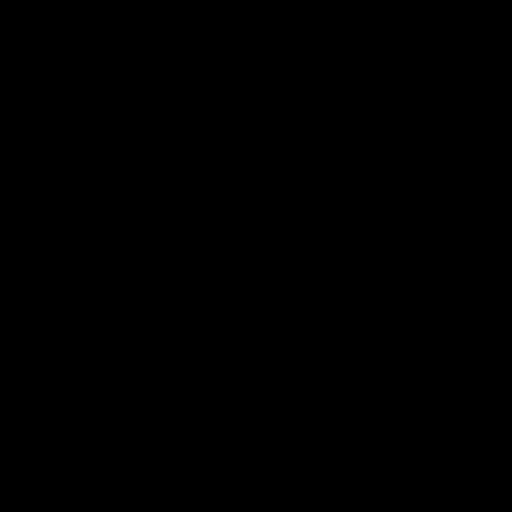

[Series 605: range-ct sk_thigh 5.0 hd_fov-tra-<alpha range> · 5 of 204 slices shown]
[im 1/204]
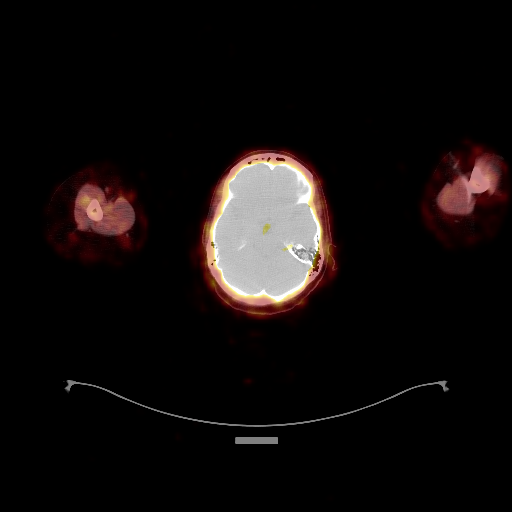
[im 51/204]
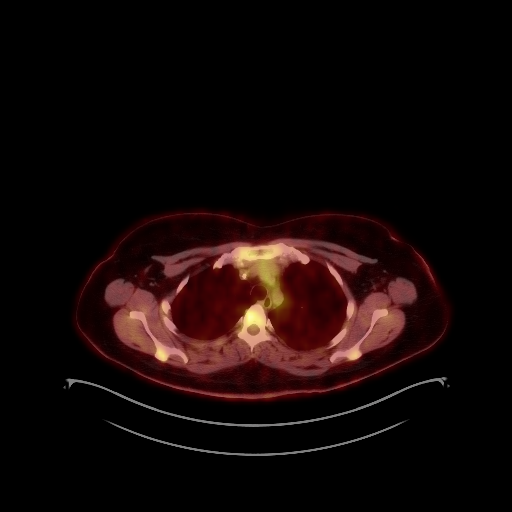
[im 102/204]
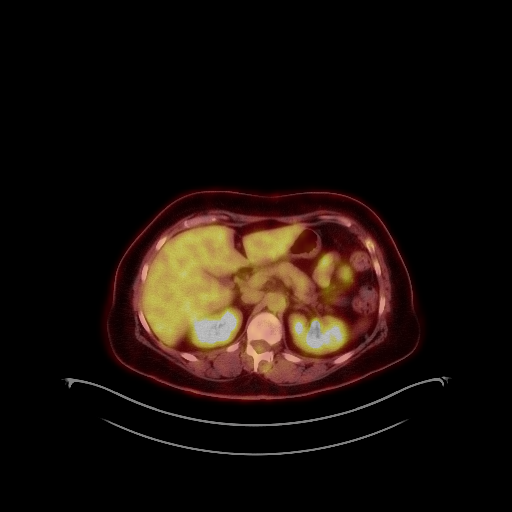
[im 153/204]
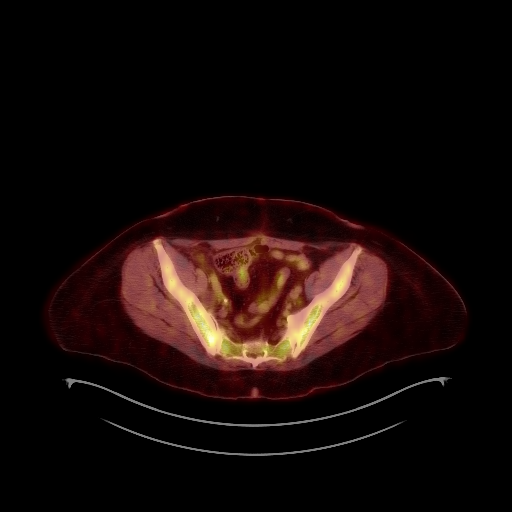
[im 204/204]
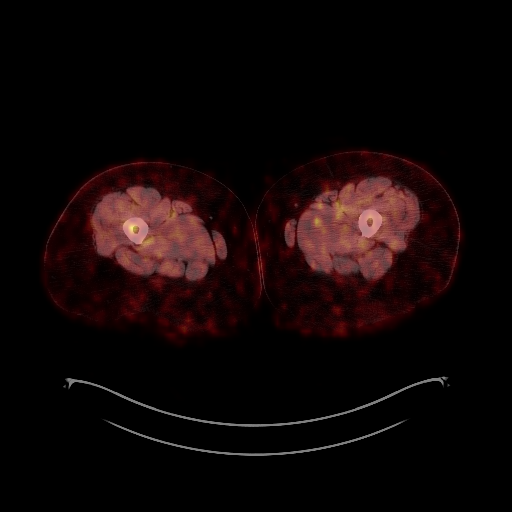

[Series 1032: results mm oncology reading · 5.0mm · 0.68mm/px · 1 of 4 slices shown (1 of 2)]
[im 1/4]
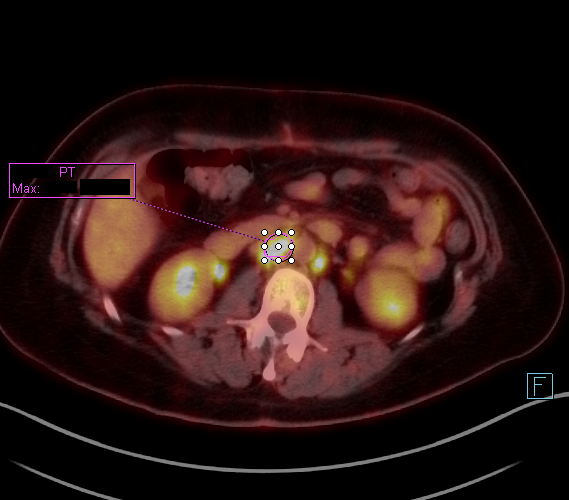

[Series 1082: results mm oncology reading · 1.18mm/px · 1 of 1 slices shown (2 of 2)]
[im 1/1]
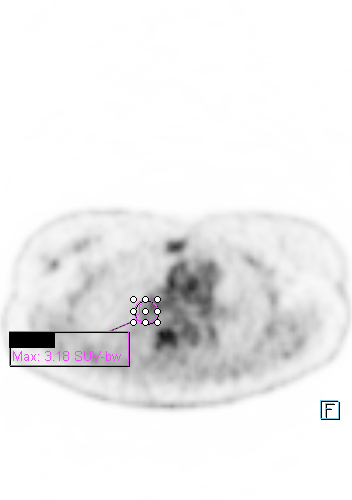

[25 of 25 positions shown; findings below may reference images not displayed]

FINDINGS: NECK

Accentuated uptake along the tonsillar pillars and lingual tonsils,
symmetric and likely physiologic. No significantly hypermetabolic
adenopathy in the neck.

CHEST

No hypermetabolic mediastinal or hilar nodes. No suspicious
pulmonary nodules on the CT data.

Right Port-A-Cath tip:  Right atrium.

ABDOMEN/PELVIS

Abnormal pathologic in hypermetabolic periaortic and aortocaval
adenopathy in the abdomen, a representative aortocaval node measures
1 cm in short axis on image 121/4 with maximum standard uptake value
11.5. Other lymph nodes in the region are similarly notably
hypermetabolic.

The left common iliac node measures 1.4 cm in short axis on image
136 of series 4 with maximum SUV 14.3. A left pelvic sidewall lymph
node adjacent to the external iliac vessels measures 0.9 cm in short
axis on image 163/4 with maximum SUV 19.3.

Fluid density cystic lesion in the dome of the right hepatic lobe 10
cm in diameter, without associated hypermetabolic activity. Fluid
density 2.8 by 2.4 cm lesion along the right pelvic sidewall is not
hypermetabolic. Various clips are present in the pelvis and along
the retroperitoneum likely from a remote node dissection. There is
some stranding of the right lower quadrant omentum approaching the
inguinal ring with low-grade metabolic activity, maximum SUV 3.1,
region merit surveillance.

SKELETON

No focal hypermetabolic activity to suggest skeletal metastasis.
IMPRESSION: 1. Hypermetabolic retroperitoneal and pelvic adenopathy compatible
with malignancy.
2. A fluid density cystic lesion along the right pelvic sidewall is
not hypermetabolic and most likely a postoperative fluid collection
such as seroma.
3. There is some mild stranding in the right lower quadrant omentum
approaching the inguinal ring, low-grade metabolic activity,
probably incidental but merits surveillance given the slight central
bandlike nodularity.
